# Patient Record
Sex: Male | Born: 1955 | Race: White | Hispanic: No | Marital: Married | Smoking: Never smoker
Health system: Southern US, Community
[De-identification: ages and names within clinical notes are randomized; demographics above are authoritative.]

## PROBLEM LIST (undated history)

## (undated) DIAGNOSIS — G4733 Obstructive sleep apnea (adult) (pediatric): Secondary | ICD-10-CM

## (undated) DIAGNOSIS — I255 Ischemic cardiomyopathy: Secondary | ICD-10-CM

## (undated) DIAGNOSIS — M75100 Unspecified rotator cuff tear or rupture of unspecified shoulder, not specified as traumatic: Secondary | ICD-10-CM

## (undated) DIAGNOSIS — E119 Type 2 diabetes mellitus without complications: Secondary | ICD-10-CM

## (undated) DIAGNOSIS — I509 Heart failure, unspecified: Secondary | ICD-10-CM

## (undated) DIAGNOSIS — F329 Major depressive disorder, single episode, unspecified: Secondary | ICD-10-CM

## (undated) DIAGNOSIS — F32A Depression, unspecified: Secondary | ICD-10-CM

## (undated) DIAGNOSIS — I959 Hypotension, unspecified: Secondary | ICD-10-CM

## (undated) DIAGNOSIS — I2510 Atherosclerotic heart disease of native coronary artery without angina pectoris: Secondary | ICD-10-CM

## (undated) DIAGNOSIS — I219 Acute myocardial infarction, unspecified: Secondary | ICD-10-CM

## (undated) DIAGNOSIS — N183 Chronic kidney disease, stage 3 unspecified: Secondary | ICD-10-CM

## (undated) DIAGNOSIS — G4730 Sleep apnea, unspecified: Secondary | ICD-10-CM

## (undated) DIAGNOSIS — I10 Essential (primary) hypertension: Secondary | ICD-10-CM

## (undated) DIAGNOSIS — E785 Hyperlipidemia, unspecified: Secondary | ICD-10-CM

## (undated) HISTORY — PX: ROTATOR CUFF REPAIR: SHX139

## (undated) HISTORY — DX: Heart failure, unspecified: I50.9

## (undated) HISTORY — DX: Chronic kidney disease, stage 3 unspecified: N18.30

## (undated) HISTORY — DX: Ischemic cardiomyopathy: I25.5

## (undated) HISTORY — DX: Essential (primary) hypertension: I10

## (undated) HISTORY — DX: Depression, unspecified: F32.A

## (undated) HISTORY — DX: Hyperlipidemia, unspecified: E78.5

## (undated) HISTORY — DX: Morbid (severe) obesity due to excess calories: E66.01

## (undated) HISTORY — DX: Atherosclerotic heart disease of native coronary artery without angina pectoris: I25.10

## (undated) HISTORY — PX: ELBOW SURGERY: SHX618

## (undated) HISTORY — DX: Chronic kidney disease, stage 3 (moderate): N18.3

## (undated) HISTORY — DX: Obstructive sleep apnea (adult) (pediatric): G47.33

## (undated) HISTORY — DX: Unspecified rotator cuff tear or rupture of unspecified shoulder, not specified as traumatic: M75.100

## (undated) HISTORY — DX: Sleep apnea, unspecified: G47.30

## (undated) HISTORY — DX: Type 2 diabetes mellitus without complications: E11.9

## (undated) HISTORY — DX: Major depressive disorder, single episode, unspecified: F32.9

## (undated) HISTORY — PX: CARPAL TUNNEL RELEASE: SHX101

---

## 1993-02-09 HISTORY — PX: ATRIAL FIBRILLATION ABLATION: SHX5732

## 1993-02-09 HISTORY — PX: UVULECTOMY: SHX2631

## 2004-08-04 ENCOUNTER — Emergency Department: Payer: Self-pay | Admitting: Emergency Medicine

## 2005-03-24 ENCOUNTER — Other Ambulatory Visit: Payer: Self-pay

## 2005-03-24 ENCOUNTER — Emergency Department: Payer: Self-pay | Admitting: Unknown Physician Specialty

## 2006-03-22 ENCOUNTER — Emergency Department: Payer: Self-pay | Admitting: Emergency Medicine

## 2006-03-22 ENCOUNTER — Other Ambulatory Visit: Payer: Self-pay

## 2006-07-07 ENCOUNTER — Ambulatory Visit: Payer: Self-pay | Admitting: Urology

## 2007-07-30 ENCOUNTER — Emergency Department: Payer: Self-pay | Admitting: Internal Medicine

## 2009-08-22 ENCOUNTER — Emergency Department: Payer: Self-pay | Admitting: Emergency Medicine

## 2009-09-17 ENCOUNTER — Emergency Department: Payer: Self-pay | Admitting: Emergency Medicine

## 2010-05-21 ENCOUNTER — Emergency Department: Payer: Self-pay | Admitting: Emergency Medicine

## 2010-05-23 ENCOUNTER — Other Ambulatory Visit: Payer: Self-pay | Admitting: Family Medicine

## 2010-07-05 ENCOUNTER — Emergency Department: Payer: Self-pay | Admitting: Emergency Medicine

## 2010-11-22 ENCOUNTER — Emergency Department: Payer: Self-pay | Admitting: Emergency Medicine

## 2010-12-08 ENCOUNTER — Ambulatory Visit: Payer: Self-pay | Admitting: Orthopedic Surgery

## 2010-12-25 ENCOUNTER — Other Ambulatory Visit: Payer: Self-pay | Admitting: Family Medicine

## 2011-03-25 ENCOUNTER — Other Ambulatory Visit: Payer: Self-pay | Admitting: Family Medicine

## 2011-03-25 LAB — COMPREHENSIVE METABOLIC PANEL
Alkaline Phosphatase: 55 U/L (ref 50–136)
Bilirubin,Total: 0.3 mg/dL (ref 0.2–1.0)
Calcium, Total: 9.6 mg/dL (ref 8.5–10.1)
Chloride: 104 mmol/L (ref 98–107)
Co2: 30 mmol/L (ref 21–32)
Creatinine: 0.92 mg/dL (ref 0.60–1.30)
EGFR (African American): >60
EGFR (Non-African Amer.): >60
Glucose: 184 mg/dL — ABNORMAL HIGH (ref 65–99)
Osmolality: 290 (ref 275–301)
SGOT(AST): 24 U/L (ref 15–37)
SGPT (ALT): 33 U/L
Sodium: 141 mmol/L (ref 136–145)

## 2011-03-25 LAB — LIPID PANEL
Cholesterol: 173 mg/dL (ref 0–200)
Ldl Cholesterol, Calc: 103 mg/dL — ABNORMAL HIGH (ref 0–100)
Triglycerides: 174 mg/dL (ref 0–200)
VLDL Cholesterol, Calc: 35 mg/dL (ref 5–40)

## 2012-01-02 ENCOUNTER — Emergency Department: Payer: Self-pay | Admitting: Unknown Physician Specialty

## 2012-03-24 ENCOUNTER — Emergency Department: Payer: Self-pay | Admitting: Emergency Medicine

## 2012-03-25 LAB — RAPID INFLUENZA A&B ANTIGENS

## 2012-06-26 ENCOUNTER — Ambulatory Visit: Payer: Self-pay | Admitting: Family Medicine

## 2012-07-22 ENCOUNTER — Other Ambulatory Visit: Payer: Self-pay | Admitting: Family Medicine

## 2012-07-22 LAB — COMPREHENSIVE METABOLIC PANEL
Albumin: 3.5 g/dL (ref 3.4–5.0)
Alkaline Phosphatase: 87 U/L (ref 50–136)
Anion Gap: 5 — ABNORMAL LOW (ref 7–16)
BUN: 42 mg/dL — ABNORMAL HIGH (ref 7–18)
Bilirubin,Total: 0.3 mg/dL (ref 0.2–1.0)
Calcium, Total: 9.1 mg/dL (ref 8.5–10.1)
Chloride: 105 mmol/L (ref 98–107)
Co2: 26 mmol/L (ref 21–32)
Creatinine: 2.51 mg/dL — ABNORMAL HIGH (ref 0.60–1.30)
EGFR (African American): 32 — ABNORMAL LOW
EGFR (Non-African Amer.): 28 — ABNORMAL LOW
Glucose: 357 mg/dL — ABNORMAL HIGH (ref 65–99)
SGOT(AST): 33 U/L (ref 15–37)
SGPT (ALT): 41 U/L (ref 12–78)

## 2012-07-22 LAB — LIPID PANEL
Cholesterol: 169 mg/dL (ref 0–200)
HDL Cholesterol: 27 mg/dL — ABNORMAL LOW (ref 40–60)
Ldl Cholesterol, Calc: 69 mg/dL (ref 0–100)
Triglycerides: 365 mg/dL — ABNORMAL HIGH (ref 0–200)
VLDL Cholesterol, Calc: 73 mg/dL — ABNORMAL HIGH (ref 5–40)

## 2013-08-04 ENCOUNTER — Emergency Department: Payer: Self-pay | Admitting: Emergency Medicine

## 2013-08-21 ENCOUNTER — Other Ambulatory Visit: Payer: Self-pay | Admitting: Family Medicine

## 2013-08-21 LAB — COMPREHENSIVE METABOLIC PANEL
ALK PHOS: 86 U/L
ALT: 22 U/L (ref 12–78)
Albumin: 3.3 g/dL — ABNORMAL LOW (ref 3.4–5.0)
Anion Gap: 8 (ref 7–16)
BUN: 27 mg/dL — ABNORMAL HIGH (ref 7–18)
Bilirubin,Total: 0.5 mg/dL (ref 0.2–1.0)
Calcium, Total: 9 mg/dL (ref 8.5–10.1)
Chloride: 95 mmol/L — ABNORMAL LOW (ref 98–107)
Co2: 25 mmol/L (ref 21–32)
Creatinine: 1.9 mg/dL — ABNORMAL HIGH (ref 0.60–1.30)
GFR CALC AF AMER: 45 — AB
GFR CALC NON AF AMER: 39 — AB
Glucose: 389 mg/dL — ABNORMAL HIGH (ref 65–99)
OSMOLALITY: 278 (ref 275–301)
Potassium: 4.7 mmol/L (ref 3.5–5.1)
SGOT(AST): 19 U/L (ref 15–37)
Sodium: 128 mmol/L — ABNORMAL LOW (ref 136–145)
Total Protein: 7.7 g/dL (ref 6.4–8.2)

## 2013-08-21 LAB — LIPID PANEL
CHOLESTEROL: 308 mg/dL — AB (ref 0–200)
HDL Cholesterol: 31 mg/dL — ABNORMAL LOW (ref 40–60)
Triglycerides: 550 mg/dL — ABNORMAL HIGH (ref 0–200)

## 2013-08-21 LAB — CBC WITH DIFFERENTIAL/PLATELET
BASOS PCT: 0.9 %
Basophil #: 0.1 10*3/uL (ref 0.0–0.1)
EOS ABS: 0.1 10*3/uL (ref 0.0–0.7)
Eosinophil %: 0.8 %
HCT: 41 % (ref 40.0–52.0)
HGB: 13.5 g/dL (ref 13.0–18.0)
Lymphocyte #: 1.4 10*3/uL (ref 1.0–3.6)
Lymphocyte %: 20.7 %
MCH: 29.6 pg (ref 26.0–34.0)
MCHC: 32.8 g/dL (ref 32.0–36.0)
MCV: 90 fL (ref 80–100)
Monocyte #: 0.3 x10 3/mm (ref 0.2–1.0)
Monocyte %: 4 %
NEUTROS PCT: 73.6 %
Neutrophil #: 5.1 10*3/uL (ref 1.4–6.5)
Platelet: 204 10*3/uL (ref 150–440)
RBC: 4.55 10*6/uL (ref 4.40–5.90)
RDW: 13.2 % (ref 11.5–14.5)
WBC: 6.9 10*3/uL (ref 3.8–10.6)

## 2013-08-21 LAB — HEMOGLOBIN A1C: Hemoglobin A1C: 13 % — ABNORMAL HIGH (ref 4.2–6.3)

## 2013-09-13 ENCOUNTER — Emergency Department: Payer: Self-pay | Admitting: Emergency Medicine

## 2013-10-17 LAB — TROPONIN I
TROPONIN-I: 0.04 ng/mL
Troponin-I: 0.03 ng/mL
Troponin-I: 0.04 ng/mL

## 2013-10-17 LAB — CBC
HCT: 37.8 % — ABNORMAL LOW (ref 40.0–52.0)
HGB: 12.4 g/dL — ABNORMAL LOW (ref 13.0–18.0)
MCH: 29.6 pg (ref 26.0–34.0)
MCHC: 32.7 g/dL (ref 32.0–36.0)
MCV: 91 fL (ref 80–100)
Platelet: 217 10*3/uL (ref 150–440)
RBC: 4.17 10*6/uL — ABNORMAL LOW (ref 4.40–5.90)
RDW: 14 % (ref 11.5–14.5)
WBC: 8.3 10*3/uL (ref 3.8–10.6)

## 2013-10-17 LAB — BASIC METABOLIC PANEL
ANION GAP: 7 (ref 7–16)
BUN: 32 mg/dL — ABNORMAL HIGH (ref 7–18)
CO2: 24 mmol/L (ref 21–32)
Calcium, Total: 8.4 mg/dL — ABNORMAL LOW (ref 8.5–10.1)
Chloride: 109 mmol/L — ABNORMAL HIGH (ref 98–107)
Creatinine: 1.59 mg/dL — ABNORMAL HIGH (ref 0.60–1.30)
EGFR (African American): 56 — ABNORMAL LOW
EGFR (Non-African Amer.): 48 — ABNORMAL LOW
GLUCOSE: 262 mg/dL — AB (ref 65–99)
Osmolality: 295 (ref 275–301)
Potassium: 4.5 mmol/L (ref 3.5–5.1)
SODIUM: 140 mmol/L (ref 136–145)

## 2013-10-17 LAB — TSH: Thyroid Stimulating Horm: 4.23 u[IU]/mL

## 2013-10-18 ENCOUNTER — Inpatient Hospital Stay: Payer: Self-pay | Admitting: Internal Medicine

## 2013-10-18 DIAGNOSIS — I200 Unstable angina: Secondary | ICD-10-CM

## 2013-10-18 DIAGNOSIS — R0602 Shortness of breath: Secondary | ICD-10-CM

## 2013-10-18 DIAGNOSIS — R079 Chest pain, unspecified: Secondary | ICD-10-CM

## 2013-10-18 DIAGNOSIS — I428 Other cardiomyopathies: Secondary | ICD-10-CM

## 2013-10-19 ENCOUNTER — Encounter: Payer: Self-pay | Admitting: Cardiovascular Disease

## 2013-10-19 DIAGNOSIS — E785 Hyperlipidemia, unspecified: Secondary | ICD-10-CM

## 2013-10-19 DIAGNOSIS — E119 Type 2 diabetes mellitus without complications: Secondary | ICD-10-CM

## 2013-10-19 DIAGNOSIS — I2510 Atherosclerotic heart disease of native coronary artery without angina pectoris: Secondary | ICD-10-CM

## 2013-10-19 DIAGNOSIS — N189 Chronic kidney disease, unspecified: Secondary | ICD-10-CM

## 2013-10-19 HISTORY — PX: CARDIAC CATHETERIZATION: SHX172

## 2013-10-19 LAB — BASIC METABOLIC PANEL
ANION GAP: 8 (ref 7–16)
BUN: 29 mg/dL — ABNORMAL HIGH (ref 7–18)
Calcium, Total: 8.7 mg/dL (ref 8.5–10.1)
Chloride: 108 mmol/L — ABNORMAL HIGH (ref 98–107)
Co2: 25 mmol/L (ref 21–32)
Creatinine: 1.12 mg/dL (ref 0.60–1.30)
EGFR (Non-African Amer.): >60
Glucose: 156 mg/dL — ABNORMAL HIGH (ref 65–99)
Osmolality: 290 (ref 275–301)
Potassium: 4 mmol/L (ref 3.5–5.1)
SODIUM: 141 mmol/L (ref 136–145)

## 2013-10-19 LAB — CBC WITH DIFFERENTIAL/PLATELET
BASOS ABS: 0.1 10*3/uL (ref 0.0–0.1)
Basophil %: 1.2 %
Eosinophil #: 0.3 10*3/uL (ref 0.0–0.7)
Eosinophil %: 5.4 %
HCT: 38 % — ABNORMAL LOW (ref 40.0–52.0)
HGB: 12.2 g/dL — AB (ref 13.0–18.0)
Lymphocyte #: 1.5 10*3/uL (ref 1.0–3.6)
Lymphocyte %: 28.9 %
MCH: 28.9 pg (ref 26.0–34.0)
MCHC: 32 g/dL (ref 32.0–36.0)
MCV: 90 fL (ref 80–100)
Monocyte #: 0.3 x10 3/mm (ref 0.2–1.0)
Monocyte %: 5.2 %
NEUTROS PCT: 59.3 %
Neutrophil #: 3.2 10*3/uL (ref 1.4–6.5)
Platelet: 197 10*3/uL (ref 150–440)
RBC: 4.21 10*6/uL — ABNORMAL LOW (ref 4.40–5.90)
RDW: 13.6 % (ref 11.5–14.5)
WBC: 5.3 10*3/uL (ref 3.8–10.6)

## 2013-10-19 LAB — LIPID PANEL
CHOLESTEROL: 198 mg/dL (ref 0–200)
HDL Cholesterol: 21 mg/dL — ABNORMAL LOW (ref 40–60)
Triglycerides: 534 mg/dL — ABNORMAL HIGH (ref 0–200)

## 2013-10-20 ENCOUNTER — Encounter: Payer: Self-pay | Admitting: *Deleted

## 2013-10-20 DIAGNOSIS — I214 Non-ST elevation (NSTEMI) myocardial infarction: Secondary | ICD-10-CM

## 2013-10-22 LAB — CULTURE, BLOOD (SINGLE)

## 2013-10-23 ENCOUNTER — Encounter (INDEPENDENT_AMBULATORY_CARE_PROVIDER_SITE_OTHER): Payer: Self-pay

## 2013-10-23 ENCOUNTER — Encounter: Payer: Self-pay | Admitting: Cardiovascular Disease

## 2013-10-23 ENCOUNTER — Encounter: Payer: Self-pay | Admitting: Nurse Practitioner

## 2013-10-23 ENCOUNTER — Ambulatory Visit (INDEPENDENT_AMBULATORY_CARE_PROVIDER_SITE_OTHER): Payer: BC Managed Care – PPO | Admitting: Nurse Practitioner

## 2013-10-23 VITALS — BP 118/82 | HR 76 | Ht 65.0 in | Wt 221.0 lb

## 2013-10-23 DIAGNOSIS — E785 Hyperlipidemia, unspecified: Secondary | ICD-10-CM

## 2013-10-23 DIAGNOSIS — I209 Angina pectoris, unspecified: Secondary | ICD-10-CM

## 2013-10-23 DIAGNOSIS — I25119 Atherosclerotic heart disease of native coronary artery with unspecified angina pectoris: Secondary | ICD-10-CM

## 2013-10-23 DIAGNOSIS — I208 Other forms of angina pectoris: Secondary | ICD-10-CM

## 2013-10-23 DIAGNOSIS — N183 Chronic kidney disease, stage 3 unspecified: Secondary | ICD-10-CM | POA: Insufficient documentation

## 2013-10-23 DIAGNOSIS — E1122 Type 2 diabetes mellitus with diabetic chronic kidney disease: Secondary | ICD-10-CM | POA: Insufficient documentation

## 2013-10-23 DIAGNOSIS — F339 Major depressive disorder, recurrent, unspecified: Secondary | ICD-10-CM | POA: Insufficient documentation

## 2013-10-23 DIAGNOSIS — R079 Chest pain, unspecified: Secondary | ICD-10-CM

## 2013-10-23 DIAGNOSIS — I2510 Atherosclerotic heart disease of native coronary artery without angina pectoris: Secondary | ICD-10-CM

## 2013-10-23 DIAGNOSIS — I2589 Other forms of chronic ischemic heart disease: Secondary | ICD-10-CM

## 2013-10-23 DIAGNOSIS — I255 Ischemic cardiomyopathy: Secondary | ICD-10-CM

## 2013-10-23 DIAGNOSIS — I10 Essential (primary) hypertension: Secondary | ICD-10-CM

## 2013-10-23 MED ORDER — ISOSORBIDE MONONITRATE ER 30 MG PO TB24: 15.0000 mg | ORAL_TABLET | Freq: Every day | ORAL | Status: DC

## 2013-10-23 NOTE — Patient Instructions (Signed)
Your physician has recommended you make the following change in your medication:  Start Imdur 15 mg (1/2 tablet) once daily   Your physician recommends that you schedule a follow-up appointment in: Dr. Kirke Corin in 3 weeks

## 2013-10-23 NOTE — Progress Notes (Signed)
Patient Name: Angel Cruz Date of Encounter: 10/23/2013  Primary Care Provider:  Tommy Rainwater, MD Primary Cardiologist:  Judie Petit. Kirke Corin, MD   Patient Profile  58 year old male with recent admission for unstable angina who presents for followup.  Problem List   Past Medical History  Diagnosis Date  . Diabetes mellitus without complication   . Hypertension   . Hyperlipidemia   . Depression   . Rotator cuff tear   . CKD (chronic kidney disease), stage III   . Ischemic cardiomyopathy     a. 10/2013 Echo: EF 35-40%, severe distal anteroseptal, anterior, and apical HK. Diast dysfxn, mild conc LVH, mildly dil LA, mild Ao sclerosis w/o stenosis.  Marland Kitchen CAD (coronary artery disease)     a. 10/2013 Lexi MV: EF 31%, apical, septal, ant-apical, inf-apical, lat-apical scar, septal and apical peri-infarct ischemia;  b. 10/2013 Cath: LM nl, LAD 20ost, 176m (faint R->L collats), D1 80p, LCX min irregs, OM1 min irregs, OM2 nl, OM3 30p, RCA 20p, PDA 60, RPL min irregs-->Med Rx.  . Morbid obesity    Past Surgical History  Procedure Laterality Date  . Cardiac catheterization  10/19/2013    [facility]    Allergies  No Known Allergies  HPI  58 year old male who was recently admitted to [facility] with complaints of cough and chest pain.  He ruled out for myocardial infarction.  He underwent stress testing which revealed apical infarct with peri-infarct ischemia.  EF was 31%.  Echocardiogram showed EF 35-40% with anteroseptal, anterior, apical wall motion abnormalities.  Given abnormal cardiac findings, we were consulted and he subsequently underwent diagnostic catheterization which revealed an occluded mid LAD with otherwise nonobstructive CAD.  There were faint right to left collaterals supplying the distal LAD distribution.  Medical therapy was optimized with aspirin, beta blocker, ACE inhibitor, and statin therapy, and he was ultimately discharged home.  Creatinine remained stable post  catheterization.  Since his discharge, he has had intermittent, mild exertional chest discomfort.  He has not had taken a sublingual nitroglycerin.  It is also noted mild discomfort when lying down.  He denies PND, orthopnea, dizziness, syncope, edema, or early satiety.  He has not been weighing himself at home.  He already adheres to a low sodium diet and even prior to his admission was actively trying to lose weight.  His wife is status post bariatric surgery.  Home Medications  Prior to Admission medications   Medication Sig Start Date End Date Taking? Authorizing Provider  aspirin 81 MG tablet Take 81 mg by mouth daily.   Yes Historical Provider, MD  carvedilol (COREG) 6.25 MG tablet Take 6.25 mg by mouth 2 (two) times daily with a meal.   Yes Historical Provider, MD  Insulin Glargine (LANTUS SOLOSTAR) 100 UNIT/ML Solostar Pen Inject 20 Units into the skin daily.   Yes Historical Provider, MD  insulin lispro (HUMALOG) 100 UNIT/ML injection Inject 5 Units into the skin 3 (three) times daily with meals.    Yes Historical Provider, MD  lisinopril (PRINIVIL,ZESTRIL) 20 MG tablet Take 20 mg by mouth daily.   Yes Historical Provider, MD  nitroGLYCERIN (NITROSTAT) 0.4 MG SL tablet Place 0.4 mg under the tongue every 5 (five) minutes as needed for chest pain.   Yes Historical Provider, MD  QUEtiapine (SEROQUEL) 100 MG tablet Take 100 mg by mouth at bedtime.   Yes Historical Provider, MD  rosuvastatin (CRESTOR) 20 MG tablet Take 20 mg by mouth daily.   Yes Historical Provider,  MD  isosorbide mononitrate (IMDUR) 30 MG 24 hr tablet Take 0.5 tablets (15 mg total) by mouth daily. 10/23/13   Ok Anis, NP    Review of Systems  As above, he has been experiencing mild exertional chest discomfort.  He has had some symptoms while lying flat on his back as well.  All other systems reviewed and are otherwise negative except as noted above.  Physical Exam  Blood pressure 118/82, pulse 76, height 5'  5" (1.651 m), weight 221 lb (100.245 kg).  General: Pleasant, NAD Psych: Normal affect. Neuro: Alert and oriented X 3. Moves all extremities spontaneously. HEENT: Normal  Neck: Supple without bruits or JVD. Lungs:  Resp regular and unlabored, CTA. Heart: RRR no s3, s4, or murmurs. Abdomen: Soft, non-tender, non-distended, BS + x 4.  Extremities: No clubbing, cyanosis or edema. DP/PT/Radials 2+ and equal bilaterally.right radial catheter site is without bleeding, bruit, or hematoma.  Accessory Clinical Findings  ECG - review of sinus rhythm, 76, diffuse T wave inversion-similar to hospital ECGs.  Assessment & Plan  1.  Stable angina/coronary artery disease: Patient is status post recent admission during which he ruled out for MI.  He underwent diagnostic catheterization revealing an occluded LAD with right-to-left collaterals, and otherwise nonobstructive coronary artery disease.  He has been medically managed with aspirin, beta blocker, ACE inhibitor, and statin therapy.  As he has been having exertional and occasionally rest angina, I have added isosorbide mononitrate 15 mg to his regimen.  He will stay out of work for two more weeks and I will arrange for him to f/u with Dr. Kirke Corin in early October.  2.  ICM:  EF 35-40% by echo @ [facility].  Euvolemic on exam.  We discussed the implications of this diagnosis and the importance of daily wts, Ss reporting, and med/dietary compliance.  Cont bb/acei.  With known CKD, would hold off on adding spiro/ARNI.  3.  HTN:  Stable on bb/acei.  4.  HL:  Cont statin therapy.  5.  IDDM:  He is now paying closer attn to his sugars @ home.  Followed by PCP.  Cont statin/insulin/ACEI.  6.  CKD III:  Creat was stable prior to d/c. Cont ACEI.  7.  Dispo:  F/U with Dr. Kirke Corin on 10/6 or sooner if necessary.  If he's feeling well @ that time, he'd like to return to work.  Will keep him out of work until his visit.  Nicolasa Ducking, NP 10/23/2013, 3:30 PM

## 2013-10-26 ENCOUNTER — Telehealth: Payer: Self-pay

## 2013-10-26 NOTE — Telephone Encounter (Signed)
Pt called, states his BP is 167/101, HR 80 at 8am.  At 10:30 a.m. 145/84, HR 102 He denies chest pain and shortness of breath  He stated he does not feel bad he is just concerned about his heart rate and blood pressure  Upon return call his BP was 122/67 and heart rate was 100

## 2013-10-26 NOTE — Telephone Encounter (Signed)
Pt called, states his BP is 167/101, HR 80 at 8am. At 10:30 a.m. 145/84, HR 102. States these readings were taken sitting down. Please call. If unable, call his son, Viviann Spare  [phone]

## 2013-10-26 NOTE — Telephone Encounter (Signed)
Continue to monitor BP and heart rate. If remains elevated, we might increase Carvedilol but his BP was relatively low during last visit.

## 2013-10-27 NOTE — Telephone Encounter (Signed)
LVM 9/18

## 2013-10-27 NOTE — Telephone Encounter (Signed)
Informed patient of Dr. Sheilah Pigeon response  Patient verbalized understanding  Will follow with patient next week

## 2013-10-28 ENCOUNTER — Emergency Department: Payer: Self-pay | Admitting: Emergency Medicine

## 2013-10-29 LAB — CBC
HCT: 40.4 % (ref 40.0–52.0)
HGB: 13.4 g/dL (ref 13.0–18.0)
MCH: 29.9 pg (ref 26.0–34.0)
MCHC: 33.3 g/dL (ref 32.0–36.0)
MCV: 90 fL (ref 80–100)
Platelet: 194 10*3/uL (ref 150–440)
RBC: 4.5 10*6/uL (ref 4.40–5.90)
RDW: 14.1 % (ref 11.5–14.5)
WBC: 5.7 10*3/uL (ref 3.8–10.6)

## 2013-10-29 LAB — BASIC METABOLIC PANEL
Anion Gap: 8 (ref 7–16)
BUN: 31 mg/dL — ABNORMAL HIGH (ref 7–18)
CHLORIDE: 106 mmol/L (ref 98–107)
CO2: 26 mmol/L (ref 21–32)
CREATININE: 1.46 mg/dL — AB (ref 0.60–1.30)
Calcium, Total: 9.9 mg/dL (ref 8.5–10.1)
GFR CALC NON AF AMER: 53 — AB
Glucose: 240 mg/dL — ABNORMAL HIGH (ref 65–99)
OSMOLALITY: 294 (ref 275–301)
Potassium: 5 mmol/L (ref 3.5–5.1)
Sodium: 140 mmol/L (ref 136–145)

## 2013-10-29 LAB — TROPONIN I: Troponin-I: 0.02 ng/mL

## 2013-10-30 ENCOUNTER — Encounter: Payer: Self-pay | Admitting: Cardiovascular Disease

## 2013-10-30 ENCOUNTER — Telehealth: Payer: Self-pay

## 2013-10-30 NOTE — Telephone Encounter (Signed)
Follow up appt scheduled for 9/22

## 2013-10-30 NOTE — Telephone Encounter (Signed)
LVM 9/21 

## 2013-10-30 NOTE — Telephone Encounter (Signed)
Follow up scheduled

## 2013-10-30 NOTE — Telephone Encounter (Signed)
Pt called with BP readings, states he went to ED via EMS on Saturday 9/19-170/101, HR 87, 167/105  HR 113 upon arrival at ED. 9/20- Discharge from ED was 169/89 HR 117. Please call.

## 2013-10-31 ENCOUNTER — Ambulatory Visit: Payer: BC Managed Care – PPO | Admitting: Cardiovascular Disease

## 2013-10-31 ENCOUNTER — Encounter: Payer: Self-pay | Admitting: Cardiovascular Disease

## 2013-10-31 ENCOUNTER — Encounter: Payer: Self-pay | Admitting: *Deleted

## 2013-10-31 ENCOUNTER — Ambulatory Visit (INDEPENDENT_AMBULATORY_CARE_PROVIDER_SITE_OTHER): Payer: BC Managed Care – PPO | Admitting: Cardiovascular Disease

## 2013-10-31 VITALS — BP 131/74 | HR 90 | Ht 65.0 in | Wt 228.2 lb

## 2013-10-31 DIAGNOSIS — I2589 Other forms of chronic ischemic heart disease: Secondary | ICD-10-CM

## 2013-10-31 DIAGNOSIS — R079 Chest pain, unspecified: Secondary | ICD-10-CM

## 2013-10-31 DIAGNOSIS — I10 Essential (primary) hypertension: Secondary | ICD-10-CM

## 2013-10-31 DIAGNOSIS — I255 Ischemic cardiomyopathy: Secondary | ICD-10-CM

## 2013-10-31 DIAGNOSIS — E785 Hyperlipidemia, unspecified: Secondary | ICD-10-CM

## 2013-10-31 DIAGNOSIS — I2510 Atherosclerotic heart disease of native coronary artery without angina pectoris: Secondary | ICD-10-CM

## 2013-10-31 MED ORDER — CARVEDILOL 25 MG PO TABS: 25.0000 mg | ORAL_TABLET | Freq: Two times a day (BID) | ORAL | Status: DC

## 2013-10-31 NOTE — Assessment & Plan Note (Signed)
He has no symptoms suggestive of angina. Continue medical therapy. 

## 2013-10-31 NOTE — Progress Notes (Signed)
HPI  58 year old male who is here today for a followup visit regarding coronary artery disease and chronic systolic heart failure. was recently admitted to [facility] with complaints of cough and chest pain.  He ruled out for myocardial infarction.  He underwent stress testing which revealed apical infarct with peri-infarct ischemia.  EF was 31%.  Echocardiogram showed EF 35-40% with anteroseptal, anterior, apical wall motion abnormalities.  Given abnormal cardiac findings, we were consulted and he subsequently underwent diagnostic catheterization which revealed an occluded mid LAD with otherwise nonobstructive CAD.  There were faint right to left collaterals supplying the distal LAD distribution.  Medical therapy was optimized with aspirin, beta blocker, ACE inhibitor, and statin therapy, and he was ultimately discharged home.  Creatinine remained stable post catheterization.  He has been doing reasonably well. However, he is complaining of palpitations, tachycardia and elevated blood pressure. He went to the emergency room for that reason. He has been taking carvedilol regularly.   No Known Allergies   Current Outpatient Prescriptions on File Prior to Visit  Medication Sig Dispense Refill  . aspirin 81 MG tablet Take 81 mg by mouth daily.      . Insulin Glargine (LANTUS SOLOSTAR) 100 UNIT/ML Solostar Pen Inject 20 Units into the skin daily.      . insulin lispro (HUMALOG) 100 UNIT/ML injection Inject 5 Units into the skin 3 (three) times daily with meals.       . nitroGLYCERIN (NITROSTAT) 0.4 MG SL tablet Place 0.4 mg under the tongue every 5 (five) minutes as needed for chest pain.      Marland Kitchen QUEtiapine (SEROQUEL) 100 MG tablet Take 100 mg by mouth at bedtime.      . rosuvastatin (CRESTOR) 20 MG tablet Take 20 mg by mouth daily.       No current facility-administered medications on file prior to visit.     Past Medical History  Diagnosis Date  . Diabetes mellitus  without complication   . Hypertension   . Hyperlipidemia   . Depression   . Rotator cuff tear   . CKD (chronic kidney disease), stage III   . Ischemic cardiomyopathy     a. 10/2013 Echo: EF 35-40%, severe distal anteroseptal, anterior, and apical HK. Diast dysfxn, mild conc LVH, mildly dil LA, mild Ao sclerosis w/o stenosis.  Marland Kitchen CAD (coronary artery disease)     a. 10/2013 Lexi MV: EF 31%, apical, septal, ant-apical, inf-apical, lat-apical scar, septal and apical peri-infarct ischemia;  b. 10/2013 Cath: LM nl, LAD 20ost, 139m (faint R->L collats), D1 80p, LCX min irregs, OM1 min irregs, OM2 nl, OM3 30p, RCA 20p, PDA 60, RPL min irregs-->Med Rx.  . Morbid obesity      Past Surgical History  Procedure Laterality Date  . Cardiac catheterization  10/19/2013    [facility]     Family History  Problem Relation Age of Onset  . Heart attack Mother   . Colon cancer Father   . Diabetes Brother   . Cancer Brother      History   Social History  . Marital Status: Married    Spouse Name: N/A    Number of Children: N/A  . Years of Education: N/A   Occupational History  . Not on file.   Social History Main Topics  . Smoking status: Never Smoker   . Smokeless tobacco: Not on file  . Alcohol Use: No  . Drug Use: No  . Sexual Activity: Not on file  Other Topics Concern  . Not on file   Social History Narrative  . No narrative on file     PHYSICAL EXAM   BP 131/74  Pulse 90  Ht 5\' 5"  (1.651 m)  Wt 228 lb 4 oz (103.534 kg)  BMI 37.98 kg/m2 Constitutional: He is oriented to person, place, and time. He appears well-developed and well-nourished. No distress.  HENT: No nasal discharge.  Head: Normocephalic and atraumatic.  Eyes: Pupils are equal and round.  No discharge. Neck: Normal range of motion. Neck supple. No JVD present. No thyromegaly present.  Cardiovascular: Normal rate, regular rhythm, normal heart sounds. Exam reveals no gallop and no friction rub. No murmur heard.    Pulmonary/Chest: Effort normal and breath sounds normal. No stridor. No respiratory distress. He has no wheezes. He has no rales. He exhibits no tenderness.  Abdominal: Soft. Bowel sounds are normal. He exhibits no distension. There is no tenderness. There is no rebound and no guarding.  Musculoskeletal: Normal range of motion. He exhibits no edema and no tenderness.  Neurological: He is alert and oriented to person, place, and time. Coordination normal.  Skin: Skin is warm and dry. No rash noted. He is not diaphoretic. No erythema. No pallor.  Psychiatric: He has a normal mood and affect. His behavior is normal. Judgment and thought content normal.       ZDG:LOVFI  Rhythm  -Anterior infarct -age undetermined.   -  T-abnormality  - Anterolateral ischemia.   ABNORMAL    ASSESSMENT AND PLAN

## 2013-10-31 NOTE — Assessment & Plan Note (Signed)
Blood pressure improved after uptitrating his medications.

## 2013-10-31 NOTE — Assessment & Plan Note (Signed)
He appears to be euvolemic. However, he continues to complain of palpitations and tachycardia. Thus, I increased the dose of carvedilol to 25 mg once daily. Continue other medications.

## 2013-10-31 NOTE — Assessment & Plan Note (Signed)
Continue treatment with Crestor with a target LDL of less than 70. 

## 2013-10-31 NOTE — Patient Instructions (Addendum)
Increase Carvedilol (Coreg) to 25 mg twice daily.  Continue other medications.   Follow up in 1 month. Stay off work until you come back in 1 month for reevaluation.

## 2013-11-09 ENCOUNTER — Encounter: Payer: Self-pay | Admitting: *Deleted

## 2013-11-09 ENCOUNTER — Telehealth: Payer: Self-pay | Admitting: *Deleted

## 2013-11-09 MED ORDER — ISOSORBIDE MONONITRATE ER 30 MG PO TB24: 30.0000 mg | ORAL_TABLET | Freq: Every day | ORAL | Status: DC

## 2013-11-09 NOTE — Telephone Encounter (Signed)
Notified patient's wife regarding which medication needs to be refilled. The patient's wife states the dose of imdur was increased at the hospital at Imdur 30 mg take one tablet daily.

## 2013-11-09 NOTE — Telephone Encounter (Signed)
Refill sent for Imdur 30 mg take one tablet daily.

## 2013-11-09 NOTE — Telephone Encounter (Signed)
30mg.

## 2013-11-13 ENCOUNTER — Telehealth: Payer: Self-pay

## 2013-11-13 ENCOUNTER — Emergency Department: Payer: Self-pay | Admitting: Emergency Medicine

## 2013-11-13 LAB — CBC
HCT: 36.7 % — AB (ref 40.0–52.0)
HGB: 12.1 g/dL — ABNORMAL LOW (ref 13.0–18.0)
MCH: 29.8 pg (ref 26.0–34.0)
MCHC: 33 g/dL (ref 32.0–36.0)
MCV: 90 fL (ref 80–100)
Platelet: 192 10*3/uL (ref 150–440)
RBC: 4.06 10*6/uL — AB (ref 4.40–5.90)
RDW: 14.3 % (ref 11.5–14.5)
WBC: 8.9 10*3/uL (ref 3.8–10.6)

## 2013-11-13 LAB — COMPREHENSIVE METABOLIC PANEL
ALBUMIN: 3.7 g/dL (ref 3.4–5.0)
ALT: 27 U/L
Alkaline Phosphatase: 59 U/L
Anion Gap: 2 — ABNORMAL LOW (ref 7–16)
BILIRUBIN TOTAL: 0.4 mg/dL (ref 0.2–1.0)
BUN: 30 mg/dL — AB (ref 7–18)
CHLORIDE: 110 mmol/L — AB (ref 98–107)
CREATININE: 1.14 mg/dL (ref 0.60–1.30)
Calcium, Total: 8.7 mg/dL (ref 8.5–10.1)
Co2: 27 mmol/L (ref 21–32)
EGFR (African American): >60
EGFR (Non-African Amer.): >60
GLUCOSE: 175 mg/dL — AB (ref 65–99)
Osmolality: 288 (ref 275–301)
POTASSIUM: 5.4 mmol/L — AB (ref 3.5–5.1)
SGOT(AST): 25 U/L (ref 15–37)
Sodium: 139 mmol/L (ref 136–145)
Total Protein: 7.3 g/dL (ref 6.4–8.2)

## 2013-11-13 LAB — TROPONIN I
TROPONIN-I: 0.03 ng/mL
Troponin-I: 0.04 ng/mL

## 2013-11-13 LAB — PRO B NATRIURETIC PEPTIDE: B-TYPE NATIURETIC PEPTID: 7374 pg/mL — AB (ref 0–125)

## 2013-11-13 NOTE — Telephone Encounter (Signed)
Informed patient and wife of Dr. Sheilah Cruz response  She stated they would be more comfortable going to the ED tonight  Appt made for tomorrow in case it is still needed  Wife will cancel if issue is resolved in the ED

## 2013-11-13 NOTE — Telephone Encounter (Signed)
Pt wife called and stated:  Patient has gained weight  Went from 220-233 in two weeks  Patient has not been adding salt and has been following a low sodium diet  He is coughing and sob  He had to take one nitro tablet today for chest pain  He is still having chest pain with walking  Blood pressure this morning was 172/98 hr 91 He is taking his daily medications now

## 2013-11-13 NOTE — Telephone Encounter (Signed)
If he is still getting chest pain , then go to ED. If not, I can see him tomorrow (I have an opening at 3 pm).

## 2013-11-13 NOTE — Telephone Encounter (Signed)
Pt wife called, states pt has been coughing all night, SOB, and some chest pains. States that this is the way he felt before he went to the ED last time. 7:00 am. BP 172/98 HR 91.

## 2013-11-14 ENCOUNTER — Telehealth: Payer: Self-pay | Admitting: *Deleted

## 2013-11-14 ENCOUNTER — Ambulatory Visit: Payer: BC Managed Care – PPO | Admitting: Cardiovascular Disease

## 2013-11-14 ENCOUNTER — Ambulatory Visit (INDEPENDENT_AMBULATORY_CARE_PROVIDER_SITE_OTHER): Payer: BC Managed Care – PPO | Admitting: Cardiovascular Disease

## 2013-11-14 ENCOUNTER — Encounter: Payer: Self-pay | Admitting: Cardiovascular Disease

## 2013-11-14 VITALS — BP 104/78 | HR 74 | Ht 65.0 in | Wt 227.5 lb

## 2013-11-14 DIAGNOSIS — I25118 Atherosclerotic heart disease of native coronary artery with other forms of angina pectoris: Secondary | ICD-10-CM

## 2013-11-14 DIAGNOSIS — R0602 Shortness of breath: Secondary | ICD-10-CM

## 2013-11-14 DIAGNOSIS — E785 Hyperlipidemia, unspecified: Secondary | ICD-10-CM

## 2013-11-14 DIAGNOSIS — I5022 Chronic systolic (congestive) heart failure: Secondary | ICD-10-CM

## 2013-11-14 DIAGNOSIS — I10 Essential (primary) hypertension: Secondary | ICD-10-CM

## 2013-11-14 DIAGNOSIS — R079 Chest pain, unspecified: Secondary | ICD-10-CM

## 2013-11-14 MED ORDER — FUROSEMIDE 20 MG PO TABS: 20.0000 mg | ORAL_TABLET | Freq: Every day | ORAL | Status: DC

## 2013-11-14 NOTE — Assessment & Plan Note (Signed)
Blood pressure is controlled on current medications. 

## 2013-11-14 NOTE — Assessment & Plan Note (Signed)
He has no convincing symptoms of angina. Symptoms overall improved with medical therapy which will be continued.

## 2013-11-14 NOTE — Assessment & Plan Note (Signed)
He is currently New York Heart Association class III. He had recent presentation with fluid overload which improved with furosemide. I decreased the dose to 20 mg once daily. I will check basic metabolic profile up on his followup later this month to determine his ability to go back to work.

## 2013-11-14 NOTE — Progress Notes (Signed)
HPI  58 year old male who is here today for a followup visit regarding coronary artery disease and chronic systolic heart failure. He was  admitted to [facility] in September with complaints of cough and chest pain.  He ruled out for myocardial infarction.  He underwent stress testing which revealed apical infarct with peri-infarct ischemia.  EF was 31%.  Echocardiogram showed EF 35-40% with anteroseptal, anterior, apical wall motion abnormalities.  Given abnormal cardiac findings, we were consulted and he subsequently underwent diagnostic catheterization which revealed an occluded mid LAD with otherwise nonobstructive CAD.  There were faint right to left collaterals supplying the distal LAD distribution.  Medical therapy was optimized with aspirin, beta blocker, ACE inhibitor, and statin therapy, and he was ultimately discharged home.   During last visit, I increased the dose of carvedilol. He continued to have elevated blood pressure and started having difficulty breathing. He went to the emergency room at [facility]. His labs showed negative cardiac enzymes. Creatinine was 1.14, BNP was 7000. CBC was unremarkable. Chest x-ray showed mild CHF. He was given one dose of IV Lasix and discharged home on 40 mg by mouth once daily. He is feeling better and reports improvement in symptoms.  No Known Allergies   Current Outpatient Prescriptions on File Prior to Visit  Medication Sig Dispense Refill  . aspirin 81 MG tablet Take 81 mg by mouth daily.      . carvedilol (COREG) 25 MG tablet Take 1 tablet (25 mg total) by mouth 2 (two) times daily.  60 tablet  6  . glucagon 1 MG injection Inject 1 mg into the vein once as needed.      . Insulin Glargine (LANTUS SOLOSTAR) 100 UNIT/ML Solostar Pen Inject 20 Units into the skin daily.      . insulin lispro (HUMALOG) 100 UNIT/ML injection Inject 5 Units into the skin 3 (three) times daily with meals.       . isosorbide mononitrate (IMDUR) 30 MG 24  hr tablet Take 1 tablet (30 mg total) by mouth daily.  30 tablet  3  . lisinopril-hydrochlorothiazide (PRINZIDE,ZESTORETIC) 20-12.5 MG per tablet Take 1 tablet by mouth daily.      . nitroGLYCERIN (NITROSTAT) 0.4 MG SL tablet Place 0.4 mg under the tongue every 5 (five) minutes as needed for chest pain.      Marland Kitchen QUEtiapine (SEROQUEL) 100 MG tablet Take 100 mg by mouth at bedtime.      . rosuvastatin (CRESTOR) 20 MG tablet Take 20 mg by mouth daily.       No current facility-administered medications on file prior to visit.     Past Medical History  Diagnosis Date  . Diabetes mellitus without complication   . Hypertension   . Hyperlipidemia   . Depression   . Rotator cuff tear   . CKD (chronic kidney disease), stage III   . Ischemic cardiomyopathy     a. 10/2013 Echo: EF 35-40%, severe distal anteroseptal, anterior, and apical HK. Diast dysfxn, mild conc LVH, mildly dil LA, mild Ao sclerosis w/o stenosis.  Marland Kitchen CAD (coronary artery disease)     a. 10/2013 Lexi MV: EF 31%, apical, septal, ant-apical, inf-apical, lat-apical scar, septal and apical peri-infarct ischemia;  b. 10/2013 Cath: LM nl, LAD 20ost, 175m (faint R->L collats), D1 80p, LCX min irregs, OM1 min irregs, OM2 nl, OM3 30p, RCA 20p, PDA 60, RPL min irregs-->Med Rx.  . Morbid obesity   . CHF (congestive heart failure)  Past Surgical History  Procedure Laterality Date  . Cardiac catheterization  10/19/2013    [facility]     Family History  Problem Relation Age of Onset  . Heart attack Mother   . Colon cancer Father   . Diabetes Brother   . Cancer Brother      History   Social History  . Marital Status: Married    Spouse Name: N/A    Number of Children: N/A  . Years of Education: N/A   Occupational History  . Not on file.   Social History Main Topics  . Smoking status: Never Smoker   . Smokeless tobacco: Not on file  . Alcohol Use: No  . Drug Use: No  . Sexual Activity: Not on file   Other Topics Concern  .  Not on file   Social History Narrative  . No narrative on file     PHYSICAL EXAM   BP 104/78  Pulse 74  Ht 5\' 5"  (1.651 m)  Wt 227 lb 8 oz (103.193 kg)  BMI 37.86 kg/m2 Constitutional: He is oriented to person, place, and time. He appears well-developed and well-nourished. No distress.  HENT: No nasal discharge.  Head: Normocephalic and atraumatic.  Eyes: Pupils are equal and round.  No discharge. Neck: Normal range of motion. Neck supple. No JVD present. No thyromegaly present.  Cardiovascular: Normal rate, regular rhythm, normal heart sounds. Exam reveals no gallop and no friction rub. No murmur heard.  Pulmonary/Chest: Effort normal and breath sounds normal. No stridor. No respiratory distress. He has no wheezes. He has no rales. He exhibits no tenderness.  Abdominal: Soft. Bowel sounds are normal. He exhibits no distension. There is no tenderness. There is no rebound and no guarding.  Musculoskeletal: Normal range of motion. He exhibits no edema and no tenderness.  Neurological: He is alert and oriented to person, place, and time. Coordination normal.  Skin: Skin is warm and dry. No rash noted. He is not diaphoretic. No erythema. No pallor.  Psychiatric: He has a normal mood and affect. His behavior is normal. Judgment and thought content normal.       WUJ:[id number]:Sinus  Rhythm  -  T-abnormality  - Anterior/lateral ischemia.   ABNORMAL   ASSESSMENT AND PLAN

## 2013-11-14 NOTE — Telephone Encounter (Signed)
Instructed patient to keep scheduled appt  Patient verbalized understanding

## 2013-11-14 NOTE — Assessment & Plan Note (Signed)
Continue treatment with rosuvastatin with a target LDL of less than 70. He will require a fasting lipid and liver profile in the near future. 

## 2013-11-14 NOTE — Telephone Encounter (Signed)
Patient did go to the ER with congestive heart failure (mild). Patient's wife wants to know if she should bring her husband today. Please call.

## 2013-11-14 NOTE — Patient Instructions (Signed)
Decrease Lasix to 20 mg once daily.   Continue other medication.   Keep appointment on 10/22

## 2013-11-30 ENCOUNTER — Encounter: Payer: Self-pay | Admitting: *Deleted

## 2013-11-30 ENCOUNTER — Ambulatory Visit (INDEPENDENT_AMBULATORY_CARE_PROVIDER_SITE_OTHER): Payer: BC Managed Care – PPO | Admitting: Cardiovascular Disease

## 2013-11-30 ENCOUNTER — Encounter: Payer: Self-pay | Admitting: Cardiovascular Disease

## 2013-11-30 VITALS — BP 104/80 | HR 64 | Ht 67.0 in | Wt 223.0 lb

## 2013-11-30 DIAGNOSIS — E785 Hyperlipidemia, unspecified: Secondary | ICD-10-CM

## 2013-11-30 DIAGNOSIS — I5022 Chronic systolic (congestive) heart failure: Secondary | ICD-10-CM

## 2013-11-30 DIAGNOSIS — I2510 Atherosclerotic heart disease of native coronary artery without angina pectoris: Secondary | ICD-10-CM

## 2013-11-30 DIAGNOSIS — I10 Essential (primary) hypertension: Secondary | ICD-10-CM

## 2013-11-30 MED ORDER — LOSARTAN POTASSIUM-HCTZ 50-12.5 MG PO TABS: 1.0000 | ORAL_TABLET | Freq: Every day | ORAL | Status: DC

## 2013-11-30 NOTE — Progress Notes (Signed)
HPI  58 year old male who is here today for a followup visit regarding coronary artery disease and chronic systolic heart failure. He was  admitted to [facility] in September with complaints of cough and chest pain.  He ruled out for myocardial infarction.  He underwent stress testing which revealed apical infarct with peri-infarct ischemia.  EF was 31%.  Echocardiogram showed EF 35-40% with anteroseptal, anterior, apical wall motion abnormalities. He underwent cardiac catheterization which revealed an occluded mid LAD with otherwise nonobstructive CAD.  There were faint right to left collaterals supplying the distal LAD distribution.  Medical therapy was optimized with aspirin, beta blocker, ACE inhibitor, and statin therapy, and he was ultimately discharged home.   He was readmitted with fluid overload and was started on Lasix. He has been doing well since then. His only complaint is dry cough. Otherwise he has been able to do more activities with no significant limitations. He feels close to his baseline.  No Known Allergies   Current Outpatient Prescriptions on File Prior to Visit  Medication Sig Dispense Refill  . aspirin 81 MG tablet Take 81 mg by mouth daily.      . carvedilol (COREG) 25 MG tablet Take 1 tablet (25 mg total) by mouth 2 (two) times daily.  60 tablet  6  . furosemide (LASIX) 20 MG tablet Take 1 tablet (20 mg total) by mouth daily.  30 tablet  6  . glucagon 1 MG injection Inject 1 mg into the vein once as needed.      . Insulin Glargine (LANTUS SOLOSTAR) 100 UNIT/ML Solostar Pen Inject 20 Units into the skin daily.      . insulin lispro (HUMALOG) 100 UNIT/ML injection Inject 5 Units into the skin 3 (three) times daily with meals.       . isosorbide mononitrate (IMDUR) 30 MG 24 hr tablet Take 1 tablet (30 mg total) by mouth daily.  30 tablet  3  . nitroGLYCERIN (NITROSTAT) 0.4 MG SL tablet Place 0.4 mg under the tongue every 5 (five) minutes as needed for  chest pain.      Marland Kitchen QUEtiapine (SEROQUEL) 100 MG tablet Take 100 mg by mouth at bedtime.      . rosuvastatin (CRESTOR) 20 MG tablet Take 20 mg by mouth daily.       No current facility-administered medications on file prior to visit.     Past Medical History  Diagnosis Date  . Diabetes mellitus without complication   . Hypertension   . Hyperlipidemia   . Depression   . Rotator cuff tear   . CKD (chronic kidney disease), stage III   . Ischemic cardiomyopathy     a. 10/2013 Echo: EF 35-40%, severe distal anteroseptal, anterior, and apical HK. Diast dysfxn, mild conc LVH, mildly dil LA, mild Ao sclerosis w/o stenosis.  Marland Kitchen CAD (coronary artery disease)     a. 10/2013 Lexi MV: EF 31%, apical, septal, ant-apical, inf-apical, lat-apical scar, septal and apical peri-infarct ischemia;  b. 10/2013 Cath: LM nl, LAD 20ost, 123m (faint R->L collats), D1 80p, LCX min irregs, OM1 min irregs, OM2 nl, OM3 30p, RCA 20p, PDA 60, RPL min irregs-->Med Rx.  . Morbid obesity   . CHF (congestive heart failure)      Past Surgical History  Procedure Laterality Date  . Cardiac catheterization  10/19/2013    [facility]     Family History  Problem Relation Age of Onset  . Heart attack Mother   . Colon  cancer Father   . Diabetes Brother   . Cancer Brother      History   Social History  . Marital Status: Married    Spouse Name: N/A    Number of Children: N/A  . Years of Education: N/A   Occupational History  . Not on file.   Social History Main Topics  . Smoking status: Never Smoker   . Smokeless tobacco: Not on file  . Alcohol Use: No  . Drug Use: No  . Sexual Activity: Not on file   Other Topics Concern  . Not on file   Social History Narrative  . No narrative on file     PHYSICAL EXAM   BP 104/80  Pulse 64  Ht 5\' 7"  (1.702 m)  Wt 223 lb (101.152 kg)  BMI 34.92 kg/m2 Constitutional: He is oriented to person, place, and time. He appears well-developed and well-nourished. No distress.   HENT: No nasal discharge.  Head: Normocephalic and atraumatic.  Eyes: Pupils are equal and round.  No discharge. Neck: Normal range of motion. Neck supple. No JVD present. No thyromegaly present.  Cardiovascular: Normal rate, regular rhythm, normal heart sounds. Exam reveals no gallop and no friction rub. No murmur heard.  Pulmonary/Chest: Effort normal and breath sounds normal. No stridor. No respiratory distress. He has no wheezes. He has no rales. He exhibits no tenderness.  Abdominal: Soft. Bowel sounds are normal. He exhibits no distension. There is no tenderness. There is no rebound and no guarding.  Musculoskeletal: Normal range of motion. He exhibits no edema and no tenderness.  Neurological: He is alert and oriented to person, place, and time. Coordination normal.  Skin: Skin is warm and dry. No rash noted. He is not diaphoretic. No erythema. No pallor.  Psychiatric: He has a normal mood and affect. His behavior is normal. Judgment and thought content normal.        ASSESSMENT AND PLAN

## 2013-11-30 NOTE — Assessment & Plan Note (Signed)
Blood pressure is now well controlled on current medications. I ordered basic metabolic profile.

## 2013-11-30 NOTE — Assessment & Plan Note (Addendum)
He Is currently New York Heart Association class II and appears to be euvolemic. Dry cough is likely related to lisinopril. Thus, I switched this to losartan.  I will plan on repeating echocardiogram in 3 months to evaluate his ejection fraction. He can resume work on November 2 with no restrictions.

## 2013-11-30 NOTE — Assessment & Plan Note (Signed)
He has improved significantly with medical therapy and currently has no symptoms suggestive of angina.

## 2013-11-30 NOTE — Assessment & Plan Note (Signed)
Continue treatment with rosuvastatin with a target LDL of less than 70. He will require a fasting lipid and liver profile in the near future.

## 2013-11-30 NOTE — Patient Instructions (Signed)
Your physician has recommended you make the following change in your medication:  Stop Lisinopril-HCTZ Start Losartan-HCT 50/12.5 mg once daily   Your physician recommends that you schedule a follow-up appointment in:  3 months   Your next appointment will be scheduled in our new office located at :  [facility]  [address]  [place], [place] [address]

## 2013-12-04 ENCOUNTER — Telehealth: Payer: Self-pay | Admitting: Cardiovascular Disease

## 2013-12-04 MED ORDER — RANOLAZINE ER 500 MG PO TB12: 500.0000 mg | ORAL_TABLET | Freq: Two times a day (BID) | ORAL | Status: DC

## 2013-12-04 NOTE — Telephone Encounter (Signed)
LVM @ 1026

## 2013-12-04 NOTE — Telephone Encounter (Signed)
Patient stated he has been feeling light headed at times since his medication was changed at his last visit  His blood pressure today is 89/52 He feels dizzy and lightheaded   I informed patient that I would inform Dr. Kirke Corin  Instructed him to be taken to the ED if he feels his situation becomes emergent

## 2013-12-04 NOTE — Telephone Encounter (Signed)
Hold coreg tonight and change the dose to 12.5 mg bid starting tomorrow am.

## 2013-12-04 NOTE — Telephone Encounter (Signed)
Pt was put on new  bp meds and he is getting light headed, please advise.

## 2013-12-05 NOTE — Telephone Encounter (Signed)
LVM 10/27 

## 2013-12-06 NOTE — Telephone Encounter (Signed)
LVM @ 1028

## 2013-12-06 NOTE — Telephone Encounter (Signed)
This encounter was created in error - please disregard.

## 2013-12-06 NOTE — Telephone Encounter (Signed)
Phone number is  Wanted to give the bp reading from this morning, 8am  98/56 Hr 67 Has not taken bp meds. Wanted to talk to [place] before he took it. Has been put on new meds.  pm  105/63 Hr 67

## 2013-12-06 NOTE — Telephone Encounter (Signed)
Stop HCTZ. Stay on Losartan 50 mg daily (he will need a new Rx for this because he was on a combo), coreg 12.5 mg bid and Lasix 20 mg daily.

## 2013-12-06 NOTE — Telephone Encounter (Signed)
Pt called again, stating he is still light headed and bp is still running low. Please advise.

## 2013-12-06 NOTE — Telephone Encounter (Signed)
Pt wife is calling back, she got the call but did not hear the phone, please call back.

## 2013-12-07 ENCOUNTER — Telehealth: Payer: Self-pay | Admitting: *Deleted

## 2013-12-07 ENCOUNTER — Inpatient Hospital Stay: Payer: Self-pay | Admitting: Internal Medicine

## 2013-12-07 ENCOUNTER — Telehealth: Payer: Self-pay | Admitting: Cardiovascular Disease

## 2013-12-07 LAB — BASIC METABOLIC PANEL
ANION GAP: 9 (ref 7–16)
BUN: 74 mg/dL — ABNORMAL HIGH (ref 7–18)
CALCIUM: 8.6 mg/dL (ref 8.5–10.1)
CO2: 22 mmol/L (ref 21–32)
Chloride: 107 mmol/L (ref 98–107)
Creatinine: 2.37 mg/dL — ABNORMAL HIGH (ref 0.60–1.30)
EGFR (African American): 37 — ABNORMAL LOW
EGFR (Non-African Amer.): 30 — ABNORMAL LOW
Glucose: 213 mg/dL — ABNORMAL HIGH (ref 65–99)
Osmolality: 304 (ref 275–301)
POTASSIUM: 5.8 mmol/L — AB (ref 3.5–5.1)
SODIUM: 138 mmol/L (ref 136–145)

## 2013-12-07 LAB — URINALYSIS, COMPLETE
BILIRUBIN, UR: NEGATIVE
BLOOD: NEGATIVE
Bacteria: NONE SEEN
Glucose,UR: NEGATIVE mg/dL (ref 0–75)
KETONE: NEGATIVE
Leukocyte Esterase: NEGATIVE
Nitrite: NEGATIVE
Ph: 5 (ref 4.5–8.0)
Protein: NEGATIVE
RBC,UR: NONE SEEN /HPF (ref 0–5)
Specific Gravity: 1.012 (ref 1.003–1.030)
Squamous Epithelial: NONE SEEN
WBC UR: 1 /HPF (ref 0–5)

## 2013-12-07 LAB — CBC WITH DIFFERENTIAL/PLATELET
BASOS PCT: 1 %
Basophil #: 0.1 10*3/uL (ref 0.0–0.1)
Eosinophil #: 0.2 10*3/uL (ref 0.0–0.7)
Eosinophil %: 4.3 %
HCT: 37.9 % — ABNORMAL LOW (ref 40.0–52.0)
HGB: 12.3 g/dL — AB (ref 13.0–18.0)
Lymphocyte #: 1.6 10*3/uL (ref 1.0–3.6)
Lymphocyte %: 29.9 %
MCH: 29.6 pg (ref 26.0–34.0)
MCHC: 32.4 g/dL (ref 32.0–36.0)
MCV: 91 fL (ref 80–100)
MONOS PCT: 5.7 %
Monocyte #: 0.3 x10 3/mm (ref 0.2–1.0)
NEUTROS ABS: 3.2 10*3/uL (ref 1.4–6.5)
Neutrophil %: 59.1 %
Platelet: 162 10*3/uL (ref 150–440)
RBC: 4.16 10*6/uL — ABNORMAL LOW (ref 4.40–5.90)
RDW: 13.9 % (ref 11.5–14.5)
WBC: 5.5 10*3/uL (ref 3.8–10.6)

## 2013-12-07 LAB — TROPONIN I: Troponin-I: <0.02 ng/mL

## 2013-12-07 MED ORDER — CARVEDILOL 12.5 MG PO TABS: 12.5000 mg | ORAL_TABLET | Freq: Two times a day (BID) | ORAL | Status: DC

## 2013-12-07 MED ORDER — LOSARTAN POTASSIUM 50 MG PO TABS: 50.0000 mg | ORAL_TABLET | Freq: Every day | ORAL | Status: DC

## 2013-12-07 NOTE — Telephone Encounter (Signed)
Patient's wife called and is worried his bp 75/43 and is dizzy when he moves around and back of neck hurts.

## 2013-12-07 NOTE — Telephone Encounter (Signed)
Pt wife is calling letting us know they are being admitted, and that are concerned about pt med's, they don't know if he will be taking them in hospital or how it will go. Please call and advise

## 2013-12-07 NOTE — Telephone Encounter (Signed)
See phone note 12/06/13

## 2013-12-07 NOTE — Telephone Encounter (Signed)
Informed patients wife to follow medication instructions given in hospital  Patients wife verbalized understanding

## 2013-12-07 NOTE — Telephone Encounter (Signed)
Patients wife called back and stated that her husband took coreg this morning and held losartan-hct  His blood pressure 74/43 He is feeling dizzy and unwell   Instructed patient and wife per Dr. Kirke Corin to go to the ED  Patient and wife verbalized understanding   Instructed to make the medication changes suggested by Dr. Kirke Corin but not to take coreg, losartan, or lasix if pressure is less than 100/60 Call our office if pressure is this low and medication needs to be held

## 2013-12-08 LAB — BASIC METABOLIC PANEL
Anion Gap: 7 (ref 7–16)
BUN: 65 mg/dL — AB (ref 7–18)
CALCIUM: 8.6 mg/dL (ref 8.5–10.1)
Chloride: 114 mmol/L — ABNORMAL HIGH (ref 98–107)
Co2: 21 mmol/L (ref 21–32)
Creatinine: 1.6 mg/dL — ABNORMAL HIGH (ref 0.60–1.30)
EGFR (Non-African Amer.): 48 — ABNORMAL LOW
GFR CALC AF AMER: 58 — AB
GLUCOSE: 117 mg/dL — AB (ref 65–99)
OSMOLALITY: 303 (ref 275–301)
Potassium: 5.1 mmol/L (ref 3.5–5.1)
Sodium: 142 mmol/L (ref 136–145)

## 2013-12-09 LAB — BASIC METABOLIC PANEL
Anion Gap: 2 — ABNORMAL LOW (ref 7–16)
BUN: 45 mg/dL — AB (ref 7–18)
CHLORIDE: 115 mmol/L — AB (ref 98–107)
CO2: 25 mmol/L (ref 21–32)
Calcium, Total: 9.1 mg/dL (ref 8.5–10.1)
Creatinine: 1.12 mg/dL (ref 0.60–1.30)
Glucose: 70 mg/dL (ref 65–99)
Osmolality: 293 (ref 275–301)
Potassium: 5.2 mmol/L — ABNORMAL HIGH (ref 3.5–5.1)
SODIUM: 142 mmol/L (ref 136–145)

## 2013-12-18 ENCOUNTER — Ambulatory Visit (INDEPENDENT_AMBULATORY_CARE_PROVIDER_SITE_OTHER): Payer: BC Managed Care – PPO | Admitting: Cardiovascular Disease

## 2013-12-18 ENCOUNTER — Encounter: Payer: Self-pay | Admitting: Cardiovascular Disease

## 2013-12-18 VITALS — BP 166/86 | HR 74 | Ht 67.0 in | Wt 223.2 lb

## 2013-12-18 DIAGNOSIS — I5022 Chronic systolic (congestive) heart failure: Secondary | ICD-10-CM

## 2013-12-18 DIAGNOSIS — R079 Chest pain, unspecified: Secondary | ICD-10-CM

## 2013-12-18 DIAGNOSIS — I2510 Atherosclerotic heart disease of native coronary artery without angina pectoris: Secondary | ICD-10-CM

## 2013-12-18 DIAGNOSIS — I10 Essential (primary) hypertension: Secondary | ICD-10-CM

## 2013-12-18 DIAGNOSIS — E785 Hyperlipidemia, unspecified: Secondary | ICD-10-CM

## 2013-12-18 MED ORDER — LOSARTAN POTASSIUM 25 MG PO TABS: 25.0000 mg | ORAL_TABLET | Freq: Every day | ORAL | Status: DC

## 2013-12-18 MED ORDER — CARVEDILOL 6.25 MG PO TABS: 6.2500 mg | ORAL_TABLET | Freq: Two times a day (BID) | ORAL | Status: DC

## 2013-12-18 NOTE — Progress Notes (Signed)
HPI   58 year old male who is here today for a followup visit regarding coronary artery disease and chronic systolic heart failure. He was  admitted to [facility] in September with complaints of cough and chest pain.  He ruled out for myocardial infarction.  He underwent stress testing which revealed apical infarct with peri-infarct ischemia.  EF was 31%.  Echocardiogram showed EF 35-40% with anteroseptal, anterior, apical wall motion abnormalities. He underwent cardiac catheterization which revealed an occluded mid LAD with otherwise nonobstructive CAD.  There were faint right to left collaterals supplying the distal LAD distribution.  Medical therapy was optimized with aspirin, beta blocker, ACE inhibitor, and statin therapy, and he was ultimately discharged home.   He was readmitted with fluid overload and was started on Lasix. During most recent visit, I switched lisinopril to losartan due to cough.He then developed persistent hypotension and was hospitalized briefly at [facility] for hypotension and acute renal failure which resolved with IV fluids. He feels back to his normal self but blood pressure has been running high.    No Known Allergies   Current Outpatient Prescriptions on File Prior to Visit  Medication Sig Dispense Refill  . aspirin 81 MG tablet Take 81 mg by mouth daily.    . furosemide (LASIX) 20 MG tablet Take 1 tablet (20 mg total) by mouth daily. 30 tablet 6  . glucagon 1 MG injection Inject 1 mg into the vein once as needed.    . Insulin Glargine (LANTUS SOLOSTAR) 100 UNIT/ML Solostar Pen Inject 20 Units into the skin daily.    . insulin lispro (HUMALOG) 100 UNIT/ML injection Inject 5 Units into the skin 3 (three) times daily with meals.     . isosorbide mononitrate (IMDUR) 30 MG 24 hr tablet Take 1 tablet (30 mg total) by mouth daily. 30 tablet 3  . nitroGLYCERIN (NITROSTAT) 0.4 MG SL tablet Place 0.4 mg under the tongue every 5 (five) minutes as needed  for chest pain.    Marland Kitchen QUEtiapine (SEROQUEL) 100 MG tablet Take 100 mg by mouth at bedtime.    . rosuvastatin (CRESTOR) 20 MG tablet Take 20 mg by mouth daily.     No current facility-administered medications on file prior to visit.     Past Medical History  Diagnosis Date  . Diabetes mellitus without complication   . Hypertension   . Hyperlipidemia   . Depression   . Rotator cuff tear   . CKD (chronic kidney disease), stage III   . Ischemic cardiomyopathy     a. 10/2013 Echo: EF 35-40%, severe distal anteroseptal, anterior, and apical HK. Diast dysfxn, mild conc LVH, mildly dil LA, mild Ao sclerosis w/o stenosis.  Marland Kitchen CAD (coronary artery disease)     a. 10/2013 Lexi MV: EF 31%, apical, septal, ant-apical, inf-apical, lat-apical scar, septal and apical peri-infarct ischemia;  b. 10/2013 Cath: LM nl, LAD 20ost, 114m (faint R->L collats), D1 80p, LCX min irregs, OM1 min irregs, OM2 nl, OM3 30p, RCA 20p, PDA 60, RPL min irregs-->Med Rx.  . Morbid obesity   . CHF (congestive heart failure)      Past Surgical History  Procedure Laterality Date  . Cardiac catheterization  10/19/2013    [facility]     Family History  Problem Relation Age of Onset  . Heart attack Mother   . Colon cancer Father   . Diabetes Brother   . Cancer Brother      History   Social History  .  Marital Status: Married    Spouse Name: N/A    Number of Children: N/A  . Years of Education: N/A   Occupational History  . Not on file.   Social History Main Topics  . Smoking status: Never Smoker   . Smokeless tobacco: Not on file  . Alcohol Use: No  . Drug Use: No  . Sexual Activity: Not on file   Other Topics Concern  . Not on file   Social History Narrative     PHYSICAL EXAM   BP 166/86 mmHg  Pulse 74  Ht 5\' 7"  (1.702 m)  Wt 223 lb 4 oz (101.266 kg)  BMI 34.96 kg/m2 Constitutional: He is oriented to person, place, and time. He appears well-developed and well-nourished. No distress.  HENT: No nasal  discharge.  Head: Normocephalic and atraumatic.  Eyes: Pupils are equal and round.  No discharge. Neck: Normal range of motion. Neck supple. No JVD present. No thyromegaly present.  Cardiovascular: Normal rate, regular rhythm, normal heart sounds. Exam reveals no gallop and no friction rub. No murmur heard.  Pulmonary/Chest: Effort normal and breath sounds normal. No stridor. No respiratory distress. He has no wheezes. He has no rales. He exhibits no tenderness.  Abdominal: Soft. Bowel sounds are normal. He exhibits no distension. There is no tenderness. There is no rebound and no guarding.  Musculoskeletal: Normal range of motion. He exhibits no edema and no tenderness.  Neurological: He is alert and oriented to person, place, and time. Coordination normal.  Skin: Skin is warm and dry. No rash noted. He is not diaphoretic. No erythema. No pallor.  Psychiatric: He has a normal mood and affect. His behavior is normal. Judgment and thought content normal.     EKG: Sinus  Rhythm  -  Nonspecific T-abnormality.   ABNORMAL     ASSESSMENT AND PLAN

## 2013-12-18 NOTE — Patient Instructions (Signed)
Your physician has recommended you make the following change in your medication:  Start Coreg 6.25 mg twice daily  Resume Losartan at 25 mg once daily   Your physician recommends that you return for lab work in:  1 week for Basic metabolic panel   Your physician recommends that you schedule a follow-up appointment in:  1 month   Your next appointment will be scheduled in our new office located at :  [facility]  [address], [address]  [place], [place] [address]

## 2013-12-18 NOTE — Assessment & Plan Note (Signed)
He has no symptoms of angina. Continue medical therapy. 

## 2013-12-18 NOTE — Assessment & Plan Note (Signed)
Adjustment in medications were made as outlined above.

## 2013-12-18 NOTE — Assessment & Plan Note (Signed)
Continue treatment with rosuvastatin with a target LDL of less than 70. 

## 2013-12-18 NOTE — Assessment & Plan Note (Signed)
He appears to be euvolemic but blood pressure has been running high since hospital discharge. I increased carvedilol to 6.25 mg twice daily and resumed losartan at a lower dose of 25 mg once daily. Check basic metabolic profile in one week.

## 2013-12-25 ENCOUNTER — Telehealth: Payer: Self-pay

## 2013-12-25 NOTE — Telephone Encounter (Signed)
  Pt wife called, states this morning at 6:30, pt BP was 189/100, this was before his Losartan. Denies CP. HR was 69.

## 2013-12-25 NOTE — Telephone Encounter (Signed)
Pt wife called, states this morning at 6:30,  pt BP was 189/100, this was before his Losartan. Denies CP. HR was 69.

## 2013-12-25 NOTE — Telephone Encounter (Signed)
LVM 11/16

## 2013-12-26 ENCOUNTER — Other Ambulatory Visit (INDEPENDENT_AMBULATORY_CARE_PROVIDER_SITE_OTHER): Payer: BC Managed Care – PPO

## 2013-12-26 DIAGNOSIS — I5022 Chronic systolic (congestive) heart failure: Secondary | ICD-10-CM

## 2013-12-26 NOTE — Telephone Encounter (Signed)
Patient and wife came in clinic today for labs  They stated patients pressure remains elevated average 170-190/100 He believes he has had a 10 lb weight gain

## 2013-12-27 LAB — BASIC METABOLIC PANEL
BUN/Creatinine Ratio: 18 (ref 9–20)
BUN: 22 mg/dL (ref 6–24)
CO2: 22 mmol/L (ref 18–29)
Calcium: 9.5 mg/dL (ref 8.7–10.2)
Chloride: 106 mmol/L (ref 97–108)
Creatinine, Ser: 1.23 mg/dL (ref 0.76–1.27)
GFR calc non Af Amer: 65 mL/min/{1.73_m2} (ref >59)
GFR, EST AFRICAN AMERICAN: 75 mL/min/{1.73_m2} (ref >59)
Glucose: 86 mg/dL (ref 65–99)
POTASSIUM: 4.9 mmol/L (ref 3.5–5.2)
Sodium: 141 mmol/L (ref 134–144)

## 2013-12-27 MED ORDER — LOSARTAN POTASSIUM 50 MG PO TABS: 50.0000 mg | ORAL_TABLET | Freq: Every day | ORAL | Status: DC

## 2013-12-27 MED ORDER — CARVEDILOL 12.5 MG PO TABS: 12.5000 mg | ORAL_TABLET | Freq: Two times a day (BID) | ORAL | Status: DC

## 2013-12-27 NOTE — Telephone Encounter (Signed)
Patient verbalized understanding  

## 2013-12-27 NOTE — Telephone Encounter (Signed)
Increase Losartan to 50 mg daily and Coreg to 12.5 mg bid.

## 2014-01-01 ENCOUNTER — Telehealth: Payer: Self-pay | Admitting: Cardiovascular Disease

## 2014-01-01 MED ORDER — LOSARTAN POTASSIUM 50 MG PO TABS: 100.0000 mg | ORAL_TABLET | Freq: Every day | ORAL | Status: DC

## 2014-01-01 NOTE — Telephone Encounter (Signed)
Pt wife calling to give bp results and to let us know if we need to change his bp meds. Heart rate is well just not bp,also pt  has lost 3 pounds.  bp is are done one in the morning and one in the night. Below are the readings.   12/27/13 Morning 171/91 HR 75  Evening 142/75 HR 75 12/28/13  Morning 167/88 HR 81 Evening 181/90 HR 71 12/29/13 Morning 180/96 HR 72 Evening 179/94 HR 68 12/30/13 Morning 176/87 HR 68

## 2014-01-01 NOTE — Telephone Encounter (Signed)
Increase Losartan to 100 mg once daily. 

## 2014-01-03 ENCOUNTER — Telehealth: Payer: Self-pay | Admitting: Cardiovascular Disease

## 2014-01-03 ENCOUNTER — Telehealth: Payer: BC Managed Care – PPO | Admitting: Cardiovascular Disease

## 2014-01-03 DIAGNOSIS — I10 Essential (primary) hypertension: Secondary | ICD-10-CM

## 2014-01-03 MED ORDER — FUROSEMIDE 40 MG PO TABS: 60.0000 mg | ORAL_TABLET | Freq: Every day | ORAL | Status: DC

## 2014-01-03 NOTE — Telephone Encounter (Signed)
Spoke w/ pharmacist.  Authorized her to give 90 day supply of lasix to pt.

## 2014-01-03 NOTE — Telephone Encounter (Signed)
-  Patient just underwent cath 10/2013 that showed LM nl, LAD 20ost, 118m (faint R->L collats), D1 80p, LCX min irregs, OM1 min irregs, OM2 nl, OM3 30p, RCA 20p, PDA 60, RPL min irregs-->Med Rx.   -His BP has been running in the SBP 170-190 range even before the recent increase of losartan 48 hours prior (ie: it has not been enough time to see the benefits of losartan).   -Limit fluid and salt intake  -Increase Lasix to 60 mg with planned recheck bmet 11/30 (SCr 1.23)  -If breathing improves by that time would consider titration of Coreg to 25 mg bid

## 2014-01-03 NOTE — Telephone Encounter (Signed)
Pt was having chest pains last night and wife recorded pt and heart rate190/105 hr 102 and then she gave him 100 mg larastain and it came down to 174/93 with a heart rate of 80. They want to know if they need to go er or not.

## 2014-01-03 NOTE — Telephone Encounter (Signed)
pharmacy calling trying to see what dosage is needed to do the refill

## 2014-01-03 NOTE — Telephone Encounter (Signed)
Spoke w/ pt's wife.  She reports that pt's BP is remaining elevated since increase in losartan.  Reports that pt has been SOB since last night. Reports an achy pain in the center of his chest, but he is uncertain if this is from his new onset cough. Pt denies sweating, nausea or vomiting. Pt's wife reports that pt's socks are making an indention in his ankles. Pt does not weigh himself daily. Reports that pt took his lasix this at 7am w/ his other meds. He has had about a 1/2 a 20 oz bottle of water this am.  She reports that pt had pizza for supper last night.  Discussed at length the need to limit pt's sodium and fluid intake.  She reports that pt is noncompliant w/ his diet.  She is concerned that pt needs to go back to ED, as he as recently hospitalized for CHF exacerbation. Advised her that I will send message to Eula Listen, PA for his recommendation.  She is appreciative and will await our call.

## 2014-01-03 NOTE — Telephone Encounter (Signed)
Spoke w/ pt's wife.  Advised her of Ryan's recommendation.  She verbalizes understanding and will call the answering service over the holidays w/ any questions or concerns.  She is agreeable to bringing pt in on Monday for repeat labs.

## 2014-01-08 ENCOUNTER — Other Ambulatory Visit (INDEPENDENT_AMBULATORY_CARE_PROVIDER_SITE_OTHER): Payer: BC Managed Care – PPO

## 2014-01-08 ENCOUNTER — Other Ambulatory Visit: Payer: Self-pay | Admitting: Cardiovascular Disease

## 2014-01-08 DIAGNOSIS — I10 Essential (primary) hypertension: Secondary | ICD-10-CM

## 2014-01-08 DIAGNOSIS — I5022 Chronic systolic (congestive) heart failure: Secondary | ICD-10-CM

## 2014-01-09 LAB — BASIC METABOLIC PANEL
BUN / CREAT RATIO: 23 — AB (ref 9–20)
BUN: 42 mg/dL — ABNORMAL HIGH (ref 6–24)
CHLORIDE: 102 mmol/L (ref 97–108)
CO2: 23 mmol/L (ref 18–29)
Calcium: 8.8 mg/dL (ref 8.7–10.2)
Creatinine, Ser: 1.84 mg/dL — ABNORMAL HIGH (ref 0.76–1.27)
GFR calc non Af Amer: 40 mL/min/{1.73_m2} — ABNORMAL LOW (ref >59)
GFR, EST AFRICAN AMERICAN: 46 mL/min/{1.73_m2} — AB (ref >59)
GLUCOSE: 214 mg/dL — AB (ref 65–99)
POTASSIUM: 4.6 mmol/L (ref 3.5–5.2)
Sodium: 140 mmol/L (ref 134–144)

## 2014-01-11 ENCOUNTER — Telehealth: Payer: Self-pay | Admitting: *Deleted

## 2014-01-11 DIAGNOSIS — I5022 Chronic systolic (congestive) heart failure: Secondary | ICD-10-CM

## 2014-01-11 DIAGNOSIS — I10 Essential (primary) hypertension: Secondary | ICD-10-CM

## 2014-01-11 MED ORDER — CARVEDILOL 25 MG PO TABS: 25.0000 mg | ORAL_TABLET | Freq: Two times a day (BID) | ORAL | Status: DC

## 2014-01-11 MED ORDER — FUROSEMIDE 40 MG PO TABS: 20.0000 mg | ORAL_TABLET | Freq: Every day | ORAL | Status: DC

## 2014-01-11 NOTE — Telephone Encounter (Signed)
LVM to inform patient lab results and to schedule lab

## 2014-01-11 NOTE — Telephone Encounter (Signed)
Patients wife states BP remains elevated  Average 170/90  Per verbal from Dr. Kirke Corin  Increase Coreg to 25 mg twice daily  Patients wife verbalized understanding

## 2014-01-11 NOTE — Telephone Encounter (Signed)
Patients wife returned call  She verbalized understanding

## 2014-01-11 NOTE — Telephone Encounter (Signed)
-----   Message from Iran Ouch, MD sent at 01/11/2014  2:33 PM EST ----- Worsening renal function. Hold Lasix for 3 days then resume at 20 mg once daily. Repeat BMP on Monday.

## 2014-01-13 ENCOUNTER — Encounter: Payer: Self-pay | Admitting: Cardiovascular Disease

## 2014-01-14 ENCOUNTER — Emergency Department: Payer: Self-pay | Admitting: Emergency Medicine

## 2014-01-14 LAB — BASIC METABOLIC PANEL
Anion Gap: 6 — ABNORMAL LOW (ref 7–16)
BUN: 32 mg/dL — AB (ref 7–18)
CO2: 26 mmol/L (ref 21–32)
Calcium, Total: 9.2 mg/dL (ref 8.5–10.1)
Chloride: 111 mmol/L — ABNORMAL HIGH (ref 98–107)
Creatinine: 1.22 mg/dL (ref 0.60–1.30)
EGFR (Non-African Amer.): >60
Glucose: 215 mg/dL — ABNORMAL HIGH (ref 65–99)
Osmolality: 298 (ref 275–301)
Potassium: 4.4 mmol/L (ref 3.5–5.1)
Sodium: 143 mmol/L (ref 136–145)

## 2014-01-14 LAB — PROTIME-INR
INR: 1
PROTHROMBIN TIME: 13.2 s (ref 11.5–14.7)

## 2014-01-14 LAB — CBC
HCT: 34.1 % — ABNORMAL LOW (ref 40.0–52.0)
HGB: 11.3 g/dL — AB (ref 13.0–18.0)
MCH: 30.1 pg (ref 26.0–34.0)
MCHC: 33 g/dL (ref 32.0–36.0)
MCV: 91 fL (ref 80–100)
PLATELETS: 181 10*3/uL (ref 150–440)
RBC: 3.74 10*6/uL — AB (ref 4.40–5.90)
RDW: 14.5 % (ref 11.5–14.5)
WBC: 6.4 10*3/uL (ref 3.8–10.6)

## 2014-01-14 LAB — TROPONIN I: Troponin-I: <0.02 ng/mL

## 2014-01-14 LAB — PRO B NATRIURETIC PEPTIDE: B-Type Natriuretic Peptide: 2150 pg/mL — ABNORMAL HIGH (ref 0–125)

## 2014-01-15 ENCOUNTER — Other Ambulatory Visit (INDEPENDENT_AMBULATORY_CARE_PROVIDER_SITE_OTHER): Payer: BC Managed Care – PPO

## 2014-01-15 ENCOUNTER — Telehealth: Payer: Self-pay | Admitting: Cardiovascular Disease

## 2014-01-15 DIAGNOSIS — I5022 Chronic systolic (congestive) heart failure: Secondary | ICD-10-CM

## 2014-01-15 NOTE — Telephone Encounter (Signed)
Pt wife is calling for patient was in ED and they did blood work. She wants to know if they need to come in for their blood work. Please call patient.

## 2014-01-16 LAB — BASIC METABOLIC PANEL
BUN/Creatinine Ratio: 27 — ABNORMAL HIGH (ref 9–20)
BUN: 31 mg/dL — ABNORMAL HIGH (ref 6–24)
CHLORIDE: 106 mmol/L (ref 97–108)
CO2: 21 mmol/L (ref 18–29)
Calcium: 10 mg/dL (ref 8.7–10.2)
Creatinine, Ser: 1.16 mg/dL (ref 0.76–1.27)
GFR calc Af Amer: 81 mL/min/{1.73_m2} (ref >59)
GFR calc non Af Amer: 70 mL/min/{1.73_m2} (ref >59)
GLUCOSE: 33 mg/dL — AB (ref 65–99)
Potassium: 4.2 mmol/L (ref 3.5–5.2)
Sodium: 144 mmol/L (ref 134–144)

## 2014-01-17 NOTE — Telephone Encounter (Signed)
Informed patient that I have printed his labs from [facility] for Dr. Kirke Corin to review  Patient verbalized understanding

## 2014-01-18 ENCOUNTER — Encounter: Payer: Self-pay | Admitting: Cardiovascular Disease

## 2014-01-18 ENCOUNTER — Ambulatory Visit (INDEPENDENT_AMBULATORY_CARE_PROVIDER_SITE_OTHER): Payer: BC Managed Care – PPO | Admitting: Cardiovascular Disease

## 2014-01-18 VITALS — BP 80/60 | HR 71 | Ht 67.0 in | Wt 233.0 lb

## 2014-01-18 DIAGNOSIS — R079 Chest pain, unspecified: Secondary | ICD-10-CM

## 2014-01-18 DIAGNOSIS — I10 Essential (primary) hypertension: Secondary | ICD-10-CM

## 2014-01-18 DIAGNOSIS — I5022 Chronic systolic (congestive) heart failure: Secondary | ICD-10-CM

## 2014-01-18 DIAGNOSIS — I2510 Atherosclerotic heart disease of native coronary artery without angina pectoris: Secondary | ICD-10-CM

## 2014-01-18 DIAGNOSIS — E785 Hyperlipidemia, unspecified: Secondary | ICD-10-CM

## 2014-01-18 NOTE — Assessment & Plan Note (Signed)
Continue treatment with rosuvastatin with a target LDL of less than 70. 

## 2014-01-18 NOTE — Assessment & Plan Note (Signed)
Losartan was decreased to 50 mg once daily as outlined above.

## 2014-01-18 NOTE — Assessment & Plan Note (Signed)
He has no symptoms of angina. Continue medical therapy. 

## 2014-01-18 NOTE — Patient Instructions (Addendum)
Decrease Losartan 50 mg 2 tablets daily to Losartan 50 mg 1 tablet daily.  Follow up with Dr. Kirke Corin in 2 months.

## 2014-01-18 NOTE — Assessment & Plan Note (Signed)
He appears to be euvolemic . His blood pressure has been extremely difficult to manage given the labile nature of this. I checked his blood pressure in both arms today and it's equal at 90/60. However, blood pressure at home has been running around 150-170 systolic. His machine has been compared to our readings in the past and it was comparable. I I prefer his blood pressure to be somewhat on the high side than being this low. I decreased the dose of losartan to 50 mg once daily.

## 2014-01-18 NOTE — Progress Notes (Signed)
HPI   58 year old male who is here today for a followup visit regarding coronary artery disease and chronic systolic heart failure. He was admitted to [facility] in September with complaints of cough and chest pain.  He ruled out for myocardial infarction.  He underwent stress testing which revealed apical infarct with peri-infarct ischemia.  EF was 31%.  Echocardiogram showed EF 35-40% with anteroseptal, anterior, apical wall motion abnormalities. He underwent cardiac catheterization which revealed an occluded mid LAD with otherwise nonobstructive CAD.  There were faint right to left collaterals supplying the distal LAD distribution.  Medical therapy was optimized with aspirin, beta blocker, ACE inhibitor, and statin therapy, and he was ultimately discharged home.   He was readmitted with fluid overload and was started on Lasix. During most recent visit, I switched lisinopril to losartan due to cough.He then developed persistent hypotension and was hospitalized briefly at [facility] for hypotension and acute renal failure which resolved with IV fluids.  His blood pressure has been very labile. He also had recent issues with hypoglycemia. He had worsening kidney function recently and thus we held his furosemide for few days. [facility] for shortness of breath. Small dose furosemide 20 mg once daily was resumed. He has been feeling better since then. Blood pressure at home is ranging from 150-170 mmHg systolic.    No Known Allergies   Current Outpatient Prescriptions on File Prior to Visit  Medication Sig Dispense Refill  . aspirin 81 MG tablet Take 81 mg by mouth daily.    . carvedilol (COREG) 25 MG tablet Take 1 tablet (25 mg total) by mouth 2 (two) times daily. 180 tablet 3  . glucagon 1 MG injection Inject 1 mg into the vein once as needed.    . Insulin Glargine (LANTUS SOLOSTAR) 100 UNIT/ML Solostar Pen Inject 20 Units into the skin  daily.    . insulin lispro (HUMALOG) 100 UNIT/ML injection Inject 5 Units into the skin 3 (three) times daily with meals.     . isosorbide mononitrate (IMDUR) 30 MG 24 hr tablet Take 1 tablet (30 mg total) by mouth daily. 30 tablet 3  . losartan (COZAAR) 50 MG tablet Take 2 tablets (100 mg total) by mouth daily. 30 tablet 6  . nitroGLYCERIN (NITROSTAT) 0.4 MG SL tablet Place 0.4 mg under the tongue every 5 (five) minutes as needed for chest pain.    Marland Kitchen. QUEtiapine (SEROQUEL) 100 MG tablet Take 100 mg by mouth at bedtime.    . rosuvastatin (CRESTOR) 20 MG tablet Take 20 mg by mouth daily.     No current facility-administered medications on file prior to visit.     Past Medical History  Diagnosis Date  . Diabetes mellitus without complication   . Hypertension   . Hyperlipidemia   . Depression   . Rotator cuff tear   . CKD (chronic kidney disease), stage III   . Ischemic cardiomyopathy     a. 10/2013 Echo: EF 35-40%, severe distal anteroseptal, anterior, and apical HK. Diast dysfxn, mild conc LVH, mildly dil LA, mild Ao sclerosis w/o stenosis.  Marland Kitchen. CAD (coronary artery disease)     a. 10/2013 Lexi MV: EF 31%, apical, septal, ant-apical, inf-apical, lat-apical scar, septal and apical peri-infarct ischemia;  b. 10/2013 Cath: LM nl, LAD 20ost, 16032m (faint R->L collats), D1 80p, LCX min irregs, OM1 min irregs, OM2 nl, OM3 30p, RCA 20p, PDA 60, RPL min irregs-->Med Rx.  .Marland Kitchen  Morbid obesity   . CHF (congestive heart failure)      Past Surgical History  Procedure Laterality Date  . Cardiac catheterization  10/19/2013    [facility]     Family History  Problem Relation Age of Onset  . Heart attack Mother   . Colon cancer Father   . Diabetes Brother   . Cancer Brother      History   Social History  . Marital Status: Married    Spouse Name: N/A    Number of Children: N/A  . Years of Education: N/A   Occupational History  . Not on file.   Social History Main Topics  . Smoking status: Never  Smoker   . Smokeless tobacco: Not on file  . Alcohol Use: No  . Drug Use: No  . Sexual Activity: Not on file   Other Topics Concern  . Not on file   Social History Narrative     PHYSICAL EXAM   BP 80/60 mmHg  Pulse 71  Ht 5\' 7"  (1.702 m)  Wt 233 lb (105.688 kg)  BMI 36.48 kg/m2 Constitutional: He is oriented to person, place, and time. He appears well-developed and well-nourished. No distress.  HENT: No nasal discharge.  Head: Normocephalic and atraumatic.  Eyes: Pupils are equal and round.  No discharge. Neck: Normal range of motion. Neck supple. No JVD present. No thyromegaly present.  Cardiovascular: Normal rate, regular rhythm, normal heart sounds. Exam reveals no gallop and no friction rub. No murmur heard.  Pulmonary/Chest: Effort normal and breath sounds normal. No stridor. No respiratory distress. He has no wheezes. He has no rales. He exhibits no tenderness.  Abdominal: Soft. Bowel sounds are normal. He exhibits no distension. There is no tenderness. There is no rebound and no guarding.  Musculoskeletal: Normal range of motion. He exhibits no edema and no tenderness.  Neurological: He is alert and oriented to person, place, and time. Coordination normal.  Skin: Skin is warm and dry. No rash noted. He is not diaphoretic. No erythema. No pallor.  Psychiatric: He has a normal mood and affect. His behavior is normal. Judgment and thought content normal.     EKG: Sinus  Rhythm  -  Negative T-waves  -Possible  Anterolateral  ischemia.   ABNORMAL     ASSESSMENT AND PLAN

## 2014-01-19 ENCOUNTER — Telehealth: Payer: Self-pay

## 2014-01-19 NOTE — Telephone Encounter (Signed)
Request from [employer], sent to [employer] on 01/22/2014.

## 2014-02-01 ENCOUNTER — Emergency Department: Payer: Self-pay | Admitting: Emergency Medicine

## 2014-02-02 LAB — CBC
HCT: 31 % — ABNORMAL LOW (ref 40.0–52.0)
HGB: 10.3 g/dL — AB (ref 13.0–18.0)
MCH: 30.3 pg (ref 26.0–34.0)
MCHC: 33.3 g/dL (ref 32.0–36.0)
MCV: 91 fL (ref 80–100)
Platelet: 158 10*3/uL (ref 150–440)
RBC: 3.41 10*6/uL — ABNORMAL LOW (ref 4.40–5.90)
RDW: 14.1 % (ref 11.5–14.5)
WBC: 5 10*3/uL (ref 3.8–10.6)

## 2014-02-02 LAB — COMPREHENSIVE METABOLIC PANEL
ALBUMIN: 3.3 g/dL — AB (ref 3.4–5.0)
ANION GAP: 7 (ref 7–16)
AST: 37 U/L (ref 15–37)
Alkaline Phosphatase: 53 U/L
BILIRUBIN TOTAL: 0.3 mg/dL (ref 0.2–1.0)
BUN: 29 mg/dL — AB (ref 7–18)
CHLORIDE: 110 mmol/L — AB (ref 98–107)
CREATININE: 1.15 mg/dL (ref 0.60–1.30)
Calcium, Total: 9.4 mg/dL (ref 8.5–10.1)
Co2: 25 mmol/L (ref 21–32)
EGFR (African American): >60
Glucose: 113 mg/dL — ABNORMAL HIGH (ref 65–99)
OSMOLALITY: 290 (ref 275–301)
Potassium: 4.2 mmol/L (ref 3.5–5.1)
SGPT (ALT): 31 U/L
Sodium: 142 mmol/L (ref 136–145)
TOTAL PROTEIN: 6.5 g/dL (ref 6.4–8.2)

## 2014-02-02 LAB — TROPONIN I: Troponin-I: 0.02 ng/mL

## 2014-02-02 LAB — PRO B NATRIURETIC PEPTIDE: B-TYPE NATIURETIC PEPTID: 1890 pg/mL — AB (ref 0–125)

## 2014-02-05 ENCOUNTER — Telehealth: Payer: Self-pay | Admitting: Cardiovascular Disease

## 2014-02-05 DIAGNOSIS — I5022 Chronic systolic (congestive) heart failure: Secondary | ICD-10-CM

## 2014-02-05 NOTE — Telephone Encounter (Signed)
Pt wife is calling stating pt went to ed 02/01/14 and they told them he had been diagnosed brachiates, but they did blood work and everything was fine. She is confused. Please call patient.

## 2014-02-05 NOTE — Telephone Encounter (Signed)
Pt c/o swelling:  1. How long have you been experiencing swelling?  12/23  2. Where is the swelling located?  Feet and legs bilateral   3.  Are you currently taking a "fluid pill"? 20 mg lasix daily   4.  Are you currently SOB?  Yes   5.  Have you traveled recently? No   Patient was seen in ED for SOB He had gained 10 lbs in 1 week  They said he had heart failure and bronchitis  They gave him an extra 40 mg of lasix x 1 He has lost 4 lbs since he was seen in the ED  Patient stated the SOB had not improved  Swelling has improved slightly

## 2014-02-06 MED ORDER — FUROSEMIDE 40 MG PO TABS: 40.0000 mg | ORAL_TABLET | Freq: Every day | ORAL | Status: DC

## 2014-02-06 NOTE — Telephone Encounter (Signed)
Reviewed Dr. Sheilah Pigeon instructions with patient  Patient verbalized understanding

## 2014-02-06 NOTE — Telephone Encounter (Signed)
Increase lasix to 40 mg once daily. Check BMP in 1 week.

## 2014-02-10 NOTE — Telephone Encounter (Signed)
Patients blood pressure has been lower.  He had some readings during the day which were SBP of 80.  Now greater than 110.  He "just hasn't felt well" but he is in no distress.   He was told to hold his Cozaar until he can be seen by either Tommy Rainwater, MD or Dr. Kirke Corin.  I told her that if she thought that he was in any distress or had any other concerning symptoms that she should call EMS and have him evaluated at the nearest ED.  They should call the office for an appt.early next week.

## 2014-02-11 ENCOUNTER — Inpatient Hospital Stay: Payer: Self-pay | Admitting: Internal Medicine

## 2014-02-11 LAB — CBC WITH DIFFERENTIAL/PLATELET
BASOS ABS: 0.1 10*3/uL (ref 0.0–0.1)
Basophil %: 1.4 %
EOS ABS: 0.2 10*3/uL (ref 0.0–0.7)
EOS PCT: 4.2 %
HCT: 37.6 % — ABNORMAL LOW (ref 40.0–52.0)
HGB: 12.2 g/dL — ABNORMAL LOW (ref 13.0–18.0)
Lymphocyte #: 1 10*3/uL (ref 1.0–3.6)
Lymphocyte %: 23.5 %
MCH: 29.5 pg (ref 26.0–34.0)
MCHC: 32.5 g/dL (ref 32.0–36.0)
MCV: 91 fL (ref 80–100)
MONO ABS: 0.2 x10 3/mm (ref 0.2–1.0)
Monocyte %: 5.1 %
NEUTROS PCT: 65.8 %
Neutrophil #: 2.9 10*3/uL (ref 1.4–6.5)
PLATELETS: 174 10*3/uL (ref 150–440)
RBC: 4.14 10*6/uL — ABNORMAL LOW (ref 4.40–5.90)
RDW: 13.8 % (ref 11.5–14.5)
WBC: 4.4 10*3/uL (ref 3.8–10.6)

## 2014-02-11 LAB — URINALYSIS, COMPLETE
Bacteria: NONE SEEN
Bilirubin,UR: NEGATIVE
Blood: NEGATIVE
GLUCOSE, UR: NEGATIVE mg/dL (ref 0–75)
KETONE: NEGATIVE
Leukocyte Esterase: NEGATIVE
NITRITE: NEGATIVE
Ph: 5 (ref 4.5–8.0)
SPECIFIC GRAVITY: 1.009 (ref 1.003–1.030)
Squamous Epithelial: NONE SEEN
WBC UR: <1 /HPF (ref 0–5)

## 2014-02-11 LAB — HEPATIC FUNCTION PANEL A (ARMC)
ALK PHOS: 67 U/L
ALT: 29 U/L
Albumin: 3.7 g/dL (ref 3.4–5.0)
BILIRUBIN TOTAL: 0.2 mg/dL (ref 0.2–1.0)
SGOT(AST): 35 U/L (ref 15–37)
TOTAL PROTEIN: 6.8 g/dL (ref 6.4–8.2)

## 2014-02-11 LAB — BASIC METABOLIC PANEL
Anion Gap: 10 (ref 7–16)
BUN: 58 mg/dL — ABNORMAL HIGH (ref 7–18)
CALCIUM: 8.8 mg/dL (ref 8.5–10.1)
Chloride: 103 mmol/L (ref 98–107)
Co2: 25 mmol/L (ref 21–32)
Creatinine: 2.33 mg/dL — ABNORMAL HIGH (ref 0.60–1.30)
EGFR (Non-African Amer.): 31 — ABNORMAL LOW
GFR CALC AF AMER: 38 — AB
Glucose: 248 mg/dL — ABNORMAL HIGH (ref 65–99)
OSMOLALITY: 300 (ref 275–301)
Potassium: 4.7 mmol/L (ref 3.5–5.1)
Sodium: 138 mmol/L (ref 136–145)

## 2014-02-11 LAB — TROPONIN I

## 2014-02-12 LAB — BASIC METABOLIC PANEL
Anion Gap: 7 (ref 7–16)
BUN: 55 mg/dL — ABNORMAL HIGH (ref 7–18)
Calcium, Total: 8.8 mg/dL (ref 8.5–10.1)
Chloride: 103 mmol/L (ref 98–107)
Co2: 30 mmol/L (ref 21–32)
Creatinine: 1.92 mg/dL — ABNORMAL HIGH (ref 0.60–1.30)
GFR CALC AF AMER: 47 — AB
GFR CALC NON AF AMER: 39 — AB
Glucose: 155 mg/dL — ABNORMAL HIGH (ref 65–99)
OSMOLALITY: 298 (ref 275–301)
POTASSIUM: 4.4 mmol/L (ref 3.5–5.1)
Sodium: 140 mmol/L (ref 136–145)

## 2014-02-12 LAB — CBC WITH DIFFERENTIAL/PLATELET
BASOS PCT: 1.1 %
Basophil #: 0.1 10*3/uL (ref 0.0–0.1)
EOS ABS: 0.2 10*3/uL (ref 0.0–0.7)
EOS PCT: 4.9 %
HCT: 34.6 % — ABNORMAL LOW (ref 40.0–52.0)
HGB: 11.4 g/dL — ABNORMAL LOW (ref 13.0–18.0)
LYMPHS PCT: 30.1 %
Lymphocyte #: 1.5 10*3/uL (ref 1.0–3.6)
MCH: 29.8 pg (ref 26.0–34.0)
MCHC: 32.9 g/dL (ref 32.0–36.0)
MCV: 91 fL (ref 80–100)
Monocyte #: 0.4 x10 3/mm (ref 0.2–1.0)
Monocyte %: 7 %
Neutrophil #: 2.9 10*3/uL (ref 1.4–6.5)
Neutrophil %: 56.9 %
PLATELETS: 167 10*3/uL (ref 150–440)
RBC: 3.82 10*6/uL — ABNORMAL LOW (ref 4.40–5.90)
RDW: 13.7 % (ref 11.5–14.5)
WBC: 5.1 10*3/uL (ref 3.8–10.6)

## 2014-02-12 LAB — HEMOGLOBIN A1C: Hemoglobin A1C: 7.8 % — ABNORMAL HIGH (ref 4.2–6.3)

## 2014-02-13 ENCOUNTER — Other Ambulatory Visit: Payer: BC Managed Care – PPO

## 2014-02-13 LAB — BASIC METABOLIC PANEL
Anion Gap: 7 (ref 7–16)
BUN: 38 mg/dL — ABNORMAL HIGH (ref 7–18)
CALCIUM: 9.1 mg/dL (ref 8.5–10.1)
Chloride: 107 mmol/L (ref 98–107)
Co2: 27 mmol/L (ref 21–32)
Creatinine: 1.44 mg/dL — ABNORMAL HIGH (ref 0.60–1.30)
EGFR (Non-African Amer.): 54 — ABNORMAL LOW
Glucose: 173 mg/dL — ABNORMAL HIGH (ref 65–99)
Osmolality: 294 (ref 275–301)
Potassium: 4.3 mmol/L (ref 3.5–5.1)
Sodium: 141 mmol/L (ref 136–145)

## 2014-02-14 ENCOUNTER — Telehealth: Payer: Self-pay | Admitting: Cardiovascular Disease

## 2014-02-14 ENCOUNTER — Encounter: Payer: Self-pay | Admitting: *Deleted

## 2014-02-14 NOTE — Telephone Encounter (Signed)
Request from [employer], sent to [employer] on 02/14/14.

## 2014-02-15 ENCOUNTER — Encounter: Payer: Self-pay | Admitting: Cardiovascular Disease

## 2014-02-15 ENCOUNTER — Ambulatory Visit (INDEPENDENT_AMBULATORY_CARE_PROVIDER_SITE_OTHER): Payer: BLUE CROSS/BLUE SHIELD | Admitting: Cardiovascular Disease

## 2014-02-15 VITALS — BP 120/74 | HR 70 | Ht 67.0 in | Wt 232.0 lb

## 2014-02-15 DIAGNOSIS — I10 Essential (primary) hypertension: Secondary | ICD-10-CM

## 2014-02-15 DIAGNOSIS — E785 Hyperlipidemia, unspecified: Secondary | ICD-10-CM

## 2014-02-15 DIAGNOSIS — I2510 Atherosclerotic heart disease of native coronary artery without angina pectoris: Secondary | ICD-10-CM

## 2014-02-15 DIAGNOSIS — I5022 Chronic systolic (congestive) heart failure: Secondary | ICD-10-CM

## 2014-02-15 MED ORDER — FUROSEMIDE 20 MG PO TABS: 20.0000 mg | ORAL_TABLET | Freq: Every day | ORAL | Status: DC | PRN

## 2014-02-15 NOTE — Assessment & Plan Note (Signed)
He has no symptoms of angina. Continue medical therapy. 

## 2014-02-15 NOTE — Assessment & Plan Note (Signed)
Continue treatment with Crestor. Target LDL is less than 70.

## 2014-02-15 NOTE — Progress Notes (Signed)
HPI   59 year old male who is here today for a followup visit regarding coronary artery disease and chronic systolic heart failure. He was admitted to [facility] in September, 2015 with complaints of cough and chest pain.  He ruled out for myocardial infarction.  He underwent stress testing which revealed apical infarct with peri-infarct ischemia.  EF was 31%.  Echocardiogram showed EF 35-40% with anteroseptal, anterior, apical wall motion abnormalities. He underwent cardiac catheterization which revealed an occluded mid LAD with otherwise nonobstructive CAD.  There were faint right to left collaterals supplying the distal LAD distribution.  Medical therapy was optimized with aspirin, beta blocker, ACE inhibitor, and statin therapy, and he was ultimately discharged home.   He had recurrent problems with labile hypertension as well as episodes of fluid overload alternating with volume depletion whenever he is on a diuretic. He went to the emergency room Christmas Eve with shortness of breath due to fluid overload. He was discharged on furosemide. He was hospitalized from January 3 to January 5 with acute renal failure and hypotension. He improved with hydration. He feels back to his baseline. He denies any chest discomfort.    No Known Allergies   Current Outpatient Prescriptions on File Prior to Visit  Medication Sig Dispense Refill  . aspirin 81 MG tablet Take 81 mg by mouth daily.    . carvedilol (COREG) 25 MG tablet Take 1 tablet (25 mg total) by mouth 2 (two) times daily. 180 tablet 3  . digoxin (LANOXIN) 0.125 MG tablet Take 0.125 mg by mouth daily.    Marland Kitchen glucagon 1 MG injection Inject 1 mg into the vein once as needed.    Marland Kitchen guaiFENesin (MUCINEX) 600 MG 12 hr tablet Take 600 mg by mouth every 12 (twelve) hours.    . Insulin Glargine (LANTUS SOLOSTAR) 100 UNIT/ML Solostar Pen Inject 4 Units into the skin daily.     . insulin lispro (HUMALOG) 100 UNIT/ML injection Inject  into the skin 3 (three) times daily with meals.     . isosorbide mononitrate (IMDUR) 30 MG 24 hr tablet Take 1 tablet (30 mg total) by mouth daily. 30 tablet 3  . nitroGLYCERIN (NITROSTAT) 0.4 MG SL tablet Place 0.4 mg under the tongue every 5 (five) minutes as needed for chest pain.    Marland Kitchen QUEtiapine (SEROQUEL) 100 MG tablet Take 100 mg by mouth at bedtime.    . rosuvastatin (CRESTOR) 20 MG tablet Take 20 mg by mouth daily.     No current facility-administered medications on file prior to visit.     Past Medical History  Diagnosis Date  . Diabetes mellitus without complication   . Hypertension   . Hyperlipidemia   . Depression   . Rotator cuff tear   . CKD (chronic kidney disease), stage III   . Ischemic cardiomyopathy     a. 10/2013 Echo: EF 35-40%, severe distal anteroseptal, anterior, and apical HK. Diast dysfxn, mild conc LVH, mildly dil LA, mild Ao sclerosis w/o stenosis.  Marland Kitchen CAD (coronary artery disease)     a. 10/2013 Lexi MV: EF 31%, apical, septal, ant-apical, inf-apical, lat-apical scar, septal and apical peri-infarct ischemia;  b. 10/2013 Cath: LM nl, LAD 20ost, 135m (faint R->L collats), D1 80p, LCX min irregs, OM1 min irregs, OM2 nl, OM3 30p, RCA 20p, PDA 60, RPL min irregs-->Med Rx.  . Morbid obesity   . CHF (congestive heart failure)      Past Surgical History  Procedure Laterality Date  .  Cardiac catheterization  10/19/2013    [facility]     Family History  Problem Relation Age of Onset  . Heart attack Mother   . Colon cancer Father   . Diabetes Brother   . Cancer Brother      History   Social History  . Marital Status: Married    Spouse Name: N/A    Number of Children: N/A  . Years of Education: N/A   Occupational History  . Not on file.   Social History Main Topics  . Smoking status: Never Smoker   . Smokeless tobacco: Not on file  . Alcohol Use: No  . Drug Use: No  . Sexual Activity: Not on file   Other Topics Concern  . Not on file   Social  History Narrative     PHYSICAL EXAM   BP 120/74 mmHg  Pulse 70  Ht 5\' 7"  (1.702 m)  Wt 232 lb (105.235 kg)  BMI 36.33 kg/m2 Constitutional: He is oriented to person, place, and time. He appears well-developed and well-nourished. No distress.  HENT: No nasal discharge.  Head: Normocephalic and atraumatic.  Eyes: Pupils are equal and round.  No discharge. Neck: Normal range of motion. Neck supple. No JVD present. No thyromegaly present.  Cardiovascular: Normal rate, regular rhythm, normal heart sounds. Exam reveals no gallop and no friction rub. No murmur heard.  Pulmonary/Chest: Effort normal and breath sounds normal. No stridor. No respiratory distress. He has no wheezes. He has no rales. He exhibits no tenderness.  Abdominal: Soft. Bowel sounds are normal. He exhibits no distension. There is no tenderness. There is no rebound and no guarding.  Musculoskeletal: Normal range of motion. He exhibits no edema and no tenderness.  Neurological: He is alert and oriented to person, place, and time. Coordination normal.  Skin: Skin is warm and dry. No rash noted. He is not diaphoretic. No erythema. No pallor.  Psychiatric: He has a normal mood and affect. His behavior is normal. Judgment and thought content normal.      ASSESSMENT AND PLAN

## 2014-02-15 NOTE — Patient Instructions (Signed)
Your physician has recommended you make the following change in your medication:  Start Lasix 20 mg once daily for weight gain of >2 lbs in 48 hrs, swelling or shortness of breath  Stop Digoxin   Your physician recommends that you schedule a follow-up appointment in:  Keep your February follow up appointment  Please make sure you bring your blood pressure cuff to that appointment

## 2014-02-15 NOTE — Assessment & Plan Note (Signed)
Blood pressure is well controlled. Home blood pressure readings are elevated. However, I suspect that his machine is not accurate. I asked him to bring the machine with him during follow-up next month.

## 2014-02-15 NOTE — Assessment & Plan Note (Signed)
He has a very narrow euvolemic window. He tends to develop volume depletion very quickly. I think the best option is to use furosemide 20 mg daily as needed based on his weight and symptoms. Given his tendency to develop acute renal failure, I elected to discontinue digoxin to eliminate the chance of toxicity.

## 2014-02-27 ENCOUNTER — Ambulatory Visit: Payer: Self-pay | Admitting: Family

## 2014-03-06 ENCOUNTER — Ambulatory Visit: Payer: BC Managed Care – PPO | Admitting: Cardiovascular Disease

## 2014-03-09 ENCOUNTER — Other Ambulatory Visit: Payer: Self-pay | Admitting: Nurse Practitioner

## 2014-03-15 ENCOUNTER — Ambulatory Visit: Payer: BLUE CROSS/BLUE SHIELD | Admitting: Endocrinology

## 2014-03-20 ENCOUNTER — Ambulatory Visit (INDEPENDENT_AMBULATORY_CARE_PROVIDER_SITE_OTHER): Payer: BLUE CROSS/BLUE SHIELD | Admitting: Cardiovascular Disease

## 2014-03-20 ENCOUNTER — Encounter: Payer: Self-pay | Admitting: Cardiovascular Disease

## 2014-03-20 VITALS — BP 158/96 | HR 72 | Ht 67.0 in | Wt 235.5 lb

## 2014-03-20 DIAGNOSIS — I10 Essential (primary) hypertension: Secondary | ICD-10-CM

## 2014-03-20 DIAGNOSIS — I5022 Chronic systolic (congestive) heart failure: Secondary | ICD-10-CM

## 2014-03-20 DIAGNOSIS — E785 Hyperlipidemia, unspecified: Secondary | ICD-10-CM

## 2014-03-20 DIAGNOSIS — I2510 Atherosclerotic heart disease of native coronary artery without angina pectoris: Secondary | ICD-10-CM

## 2014-03-20 MED ORDER — SACUBITRIL-VALSARTAN 24-26 MG PO TABS: 1.0000 | ORAL_TABLET | Freq: Two times a day (BID) | ORAL | Status: DC

## 2014-03-20 NOTE — Progress Notes (Signed)
HPI   59 year old male who is here today for a followup visit regarding coronary artery disease and chronic systolic heart failure. He was admitted to [facility] in September, 2015 with complaints of cough and chest pain.  He ruled out for myocardial infarction.  He underwent stress testing which revealed apical infarct with peri-infarct ischemia.  EF was 31%.  Echocardiogram showed EF 35-40% with anteroseptal, anterior, apical wall motion abnormalities. He underwent cardiac catheterization which revealed an occluded mid LAD with otherwise nonobstructive CAD.  There were faint right to left collaterals supplying the distal LAD distribution.  He had recurrent problems with labile hypertension as well as episodes of fluid overload alternating with volume depletion whenever he is on a diuretic.  He has been doing reasonably well with no chest pain. Exertional dyspnea is stable. He has minimal leg edema and uses furosemide only as needed.    No Known Allergies   Current Outpatient Prescriptions on File Prior to Visit  Medication Sig Dispense Refill  . aspirin 81 MG tablet Take 81 mg by mouth daily.    . carvedilol (COREG) 25 MG tablet Take 1 tablet (25 mg total) by mouth 2 (two) times daily. 180 tablet 3  . furosemide (LASIX) 20 MG tablet Take 1 tablet (20 mg total) by mouth daily as needed for fluid or edema (take one for weight gain of >2 lbs in 48 hrs, swelling, sob). 30 tablet 6  . glucagon 1 MG injection Inject 1 mg into the vein once as needed.    Marland Kitchen guaiFENesin (MUCINEX) 600 MG 12 hr tablet Take 600 mg by mouth every 12 (twelve) hours.    . Insulin Glargine (LANTUS SOLOSTAR) 100 UNIT/ML Solostar Pen Inject 4 Units into the skin daily.     . insulin lispro (HUMALOG) 100 UNIT/ML injection Inject into the skin 3 (three) times daily with meals.     . isosorbide mononitrate (IMDUR) 30 MG 24 hr tablet TAKE 1 TABLET BY MOUTH DAILY. 30 tablet 3  . losartan (COZAAR) 50 MG  tablet Take 50 mg by mouth daily.    . nitroGLYCERIN (NITROSTAT) 0.4 MG SL tablet Place 0.4 mg under the tongue every 5 (five) minutes as needed for chest pain.    Marland Kitchen QUEtiapine (SEROQUEL) 100 MG tablet Take 100 mg by mouth at bedtime.    . rosuvastatin (CRESTOR) 20 MG tablet Take 20 mg by mouth daily.     No current facility-administered medications on file prior to visit.     Past Medical History  Diagnosis Date  . Diabetes mellitus without complication   . Hypertension   . Hyperlipidemia   . Depression   . Rotator cuff tear   . CKD (chronic kidney disease), stage III   . Ischemic cardiomyopathy     a. 10/2013 Echo: EF 35-40%, severe distal anteroseptal, anterior, and apical HK. Diast dysfxn, mild conc LVH, mildly dil LA, mild Ao sclerosis w/o stenosis.  Marland Kitchen CAD (coronary artery disease)     a. 10/2013 Lexi MV: EF 31%, apical, septal, ant-apical, inf-apical, lat-apical scar, septal and apical peri-infarct ischemia;  b. 10/2013 Cath: LM nl, LAD 20ost, 151m (faint R->L collats), D1 80p, LCX min irregs, OM1 min irregs, OM2 nl, OM3 30p, RCA 20p, PDA 60, RPL min irregs-->Med Rx.  . Morbid obesity   . CHF (congestive heart failure)      Past Surgical History  Procedure Laterality Date  . Cardiac catheterization  10/19/2013    [facility]  Family History  Problem Relation Age of Onset  . Heart attack Mother   . Colon cancer Father   . Diabetes Brother   . Cancer Brother      History   Social History  . Marital Status: Married    Spouse Name: N/A    Number of Children: N/A  . Years of Education: N/A   Occupational History  . Not on file.   Social History Main Topics  . Smoking status: Never Smoker   . Smokeless tobacco: Not on file  . Alcohol Use: No  . Drug Use: No  . Sexual Activity: Not on file   Other Topics Concern  . Not on file   Social History Narrative     PHYSICAL EXAM   BP 158/96 mmHg  Pulse 72  Ht 5\' 7"  (1.702 m)  Wt 235 lb 8 oz (106.822 kg)  BMI  36.88 kg/m2 Constitutional: He is oriented to person, place, and time. He appears well-developed and well-nourished. No distress.  HENT: No nasal discharge.  Head: Normocephalic and atraumatic.  Eyes: Pupils are equal and round.  No discharge. Neck: Normal range of motion. Neck supple. No JVD present. No thyromegaly present.  Cardiovascular: Normal rate, regular rhythm, normal heart sounds. Exam reveals no gallop and no friction rub. No murmur heard.  Pulmonary/Chest: Effort normal and breath sounds normal. No stridor. No respiratory distress. He has no wheezes. He has no rales. He exhibits no tenderness.  Abdominal: Soft. Bowel sounds are normal. He exhibits no distension. There is no tenderness. There is no rebound and no guarding.  Musculoskeletal: Normal range of motion. He exhibits no edema and no tenderness.  Neurological: He is alert and oriented to person, place, and time. Coordination normal.  Skin: Skin is warm and dry. No rash noted. He is not diaphoretic. No erythema. No pallor.  Psychiatric: He has a normal mood and affect. His behavior is normal. Judgment and thought content normal.      ASSESSMENT AND PLAN

## 2014-03-20 NOTE — Assessment & Plan Note (Signed)
Continue treatment with rosuvastatin with a target LDL of less than 70. 

## 2014-03-20 NOTE — Assessment & Plan Note (Signed)
He is doing reasonably well with no symptoms suggestive of angina. Continue medical therapy. 

## 2014-03-20 NOTE — Patient Instructions (Signed)
Your physician has recommended you make the following change in your medication:  Stop Valsartan  Start Entresto 24/26 mg twice daily   Your physician recommends that you return for lab work in:  BMP in one week   Your physician recommends that you schedule a follow-up appointment in:  In 1 month with Dr. Kirke Corin

## 2014-03-20 NOTE — Assessment & Plan Note (Signed)
He appears to be euvolemic. I am switching losartan to Entresto. Check basic metabolic profile in one week. I will attempt up titration in 2-4 weeks. I might consider stopping isosorbide.

## 2014-03-20 NOTE — Assessment & Plan Note (Signed)
Patent stent have labile hypertension. Blood pressure is elevated today. I'm switching him from losartan to [facility] which tends to have more hypotensive effect.

## 2014-03-21 ENCOUNTER — Telehealth: Payer: Self-pay | Admitting: *Deleted

## 2014-03-21 NOTE — Telephone Encounter (Signed)
Spoke with patients wife  Informed her that I am leaving a co pay card at the front desk for her  Instructed patients wife to call if she has any issues with the copay card  Patients wife verbalized understanding

## 2014-03-21 NOTE — Telephone Encounter (Signed)
Pt wife calling stating that they came in yesterday and we gave them samples   And to let us know if insurance did not cover it.   She is calling to let us know they do not cover it  Sherryll Burger is the medicine. It is 500 a month.   They would need [place] to fill out a paper work to help them out with the payment on it.

## 2014-03-27 ENCOUNTER — Other Ambulatory Visit (INDEPENDENT_AMBULATORY_CARE_PROVIDER_SITE_OTHER): Payer: BLUE CROSS/BLUE SHIELD | Admitting: *Deleted

## 2014-03-27 DIAGNOSIS — I5022 Chronic systolic (congestive) heart failure: Secondary | ICD-10-CM

## 2014-03-28 ENCOUNTER — Telehealth: Payer: Self-pay | Admitting: *Deleted

## 2014-03-28 ENCOUNTER — Telehealth: Payer: Self-pay

## 2014-03-28 LAB — BASIC METABOLIC PANEL
BUN / CREAT RATIO: 22 — AB (ref 9–20)
BUN: 40 mg/dL — ABNORMAL HIGH (ref 6–24)
CHLORIDE: 102 mmol/L (ref 97–108)
CO2: 19 mmol/L (ref 18–29)
Calcium: 8.8 mg/dL (ref 8.7–10.2)
Creatinine, Ser: 1.84 mg/dL — ABNORMAL HIGH (ref 0.76–1.27)
GFR calc Af Amer: 46 mL/min/{1.73_m2} — ABNORMAL LOW (ref >59)
GFR calc non Af Amer: 40 mL/min/{1.73_m2} — ABNORMAL LOW (ref >59)
GLUCOSE: 311 mg/dL — AB (ref 65–99)
Potassium: 5.3 mmol/L — ABNORMAL HIGH (ref 3.5–5.2)
SODIUM: 135 mmol/L (ref 134–144)

## 2014-03-28 NOTE — Telephone Encounter (Signed)
Error

## 2014-03-28 NOTE — Telephone Encounter (Signed)
Reviewed results with patient.  Patient stated he has been taking his lasix 5 days in a row because he had some swelling and a cough  I instructed patient to hold off on the lasix and increase his fluid intake  Educated patient on a low potassium diet  He stated he eats a lot of bananas and tomatoes  Instructed patient to follow a low potassium diet  Patient verbalized understanding   Instructed patient to call if swelling, cough or weight gain worsen

## 2014-03-28 NOTE — Telephone Encounter (Signed)
-----   Message from Iran Ouch, MD sent at 03/28/2014 10:49 AM EST ----- Renal function is worse. Make sure he is not taking furosemide regularly and only as needed. He needs to increase his fluid intake. Blood sugar is also elevated. He needs to follow-up with his primary care physician about that. Follow low potassium diet. Repeat basic metabolic profile during his next follow-up.

## 2014-03-28 NOTE — Telephone Encounter (Signed)
Called patient to discuss appointment options. Patient "no showed" appt with Dr. Welford Roche on 03/15/14 and wants to reschedule. Received note from [facility] that patient would like to be seen in [place] in the late afternoon or early morning on feb. 26. That date is not available.

## 2014-03-29 ENCOUNTER — Telehealth: Payer: Self-pay | Admitting: *Deleted

## 2014-03-29 NOTE — Telephone Encounter (Signed)
Reviewed labs with patient.  Patient verbalized understanding.

## 2014-03-29 NOTE — Telephone Encounter (Signed)
Pt states that he would like to know the numbers for his kidney results from blood work.  Spoke to wife but pt would like to know.

## 2014-04-19 ENCOUNTER — Ambulatory Visit (INDEPENDENT_AMBULATORY_CARE_PROVIDER_SITE_OTHER): Payer: BLUE CROSS/BLUE SHIELD | Admitting: Cardiovascular Disease

## 2014-04-19 ENCOUNTER — Encounter: Payer: Self-pay | Admitting: Cardiovascular Disease

## 2014-04-19 VITALS — BP 104/68 | HR 66 | Ht 67.0 in | Wt 226.0 lb

## 2014-04-19 DIAGNOSIS — E785 Hyperlipidemia, unspecified: Secondary | ICD-10-CM

## 2014-04-19 DIAGNOSIS — I10 Essential (primary) hypertension: Secondary | ICD-10-CM

## 2014-04-19 DIAGNOSIS — I2510 Atherosclerotic heart disease of native coronary artery without angina pectoris: Secondary | ICD-10-CM

## 2014-04-19 DIAGNOSIS — I5022 Chronic systolic (congestive) heart failure: Secondary | ICD-10-CM

## 2014-04-19 NOTE — Assessment & Plan Note (Signed)
Blood pressure is slightly low. I discontinued Imdur .

## 2014-04-19 NOTE — Assessment & Plan Note (Signed)
He is doing well overall with no symptoms suggestive of angina. Given relatively low blood pressure, I discontinued isosorbide.

## 2014-04-19 NOTE — Progress Notes (Signed)
HPI   59 year old male who is here today for a followup visit regarding coronary artery disease and chronic systolic heart failure. He was admitted to [facility] in September, 2015 with complaints of cough and chest pain.  He ruled out for myocardial infarction.  He underwent stress testing which revealed apical infarct with peri-infarct ischemia.  EF was 31%.  Echocardiogram showed EF 35-40% with anteroseptal, anterior, apical wall motion abnormalities. He underwent cardiac catheterization which revealed an occluded mid LAD with otherwise nonobstructive CAD.  There were faint right to left collaterals supplying the distal LAD distribution.  He had recurrent problems with labile hypertension as well as episodes of fluid overload alternating with volume depletion whenever he is on a diuretic.  He has been doing reasonably well with no chest pain. Exertional dyspnea is stable.  During last visit, I switched him to [facility]. He has been doing well overall. He was diagnosed with sleep apnea but has not been fitted for CPAP yet.   No Known Allergies   Current Outpatient Prescriptions on File Prior to Visit  Medication Sig Dispense Refill  . aspirin 81 MG tablet Take 81 mg by mouth daily.    . carvedilol (COREG) 25 MG tablet Take 1 tablet (25 mg total) by mouth 2 (two) times daily. 180 tablet 3  . furosemide (LASIX) 20 MG tablet Take 1 tablet (20 mg total) by mouth daily as needed for fluid or edema (take one for weight gain of >2 lbs in 48 hrs, swelling, sob). 30 tablet 6  . glucagon 1 MG injection Inject 1 mg into the vein once as needed.    . Insulin Glargine (LANTUS SOLOSTAR) 100 UNIT/ML Solostar Pen Inject 4 Units into the skin daily.     . insulin lispro (HUMALOG) 100 UNIT/ML injection Inject into the skin 3 (three) times daily with meals.     . isosorbide mononitrate (IMDUR) 30 MG 24 hr tablet TAKE 1 TABLET BY MOUTH DAILY. 30 tablet 3  . nitroGLYCERIN (NITROSTAT) 0.4 MG  SL tablet Place 0.4 mg under the tongue every 5 (five) minutes as needed for chest pain.    Marland Kitchen QUEtiapine (SEROQUEL) 100 MG tablet Take 100 mg by mouth at bedtime.    . rosuvastatin (CRESTOR) 20 MG tablet Take 20 mg by mouth daily.    . sacubitril-valsartan (ENTRESTO) 24-26 MG Take 1 tablet by mouth 2 (two) times daily. 60 tablet 3   No current facility-administered medications on file prior to visit.     Past Medical History  Diagnosis Date  . Diabetes mellitus without complication   . Hypertension   . Hyperlipidemia   . Depression   . Rotator cuff tear   . CKD (chronic kidney disease), stage III   . Ischemic cardiomyopathy     a. 10/2013 Echo: EF 35-40%, severe distal anteroseptal, anterior, and apical HK. Diast dysfxn, mild conc LVH, mildly dil LA, mild Ao sclerosis w/o stenosis.  Marland Kitchen CAD (coronary artery disease)     a. 10/2013 Lexi MV: EF 31%, apical, septal, ant-apical, inf-apical, lat-apical scar, septal and apical peri-infarct ischemia;  b. 10/2013 Cath: LM nl, LAD 20ost, 129m (faint R->L collats), D1 80p, LCX min irregs, OM1 min irregs, OM2 nl, OM3 30p, RCA 20p, PDA 60, RPL min irregs-->Med Rx.  . Morbid obesity   . CHF (congestive heart failure)      Past Surgical History  Procedure Laterality Date  . Cardiac catheterization  10/19/2013    [facility]  Family History  Problem Relation Age of Onset  . Heart attack Mother   . Colon cancer Father   . Diabetes Brother   . Cancer Brother      History   Social History  . Marital Status: Married    Spouse Name: N/A  . Number of Children: N/A  . Years of Education: N/A   Occupational History  . Not on file.   Social History Main Topics  . Smoking status: Never Smoker   . Smokeless tobacco: Not on file  . Alcohol Use: No  . Drug Use: No  . Sexual Activity: Not on file   Other Topics Concern  . Not on file   Social History Narrative     PHYSICAL EXAM   BP 104/68 mmHg  Pulse 66  Ht 5\' 7"  (1.702 m)  Wt 226  lb (102.513 kg)  BMI 35.39 kg/m2 Constitutional: He is oriented to person, place, and time. He appears well-developed and well-nourished. No distress.  HENT: No nasal discharge.  Head: Normocephalic and atraumatic.  Eyes: Pupils are equal and round.  No discharge. Neck: Normal range of motion. Neck supple. No JVD present. No thyromegaly present.  Cardiovascular: Normal rate, regular rhythm, normal heart sounds. Exam reveals no gallop and no friction rub. No murmur heard.  Pulmonary/Chest: Effort normal and breath sounds normal. No stridor. No respiratory distress. He has no wheezes. He has no rales. He exhibits no tenderness.  Abdominal: Soft. Bowel sounds are normal. He exhibits no distension. There is no tenderness. There is no rebound and no guarding.  Musculoskeletal: Normal range of motion. He exhibits no edema and no tenderness.  Neurological: He is alert and oriented to person, place, and time. Coordination normal.  Skin: Skin is warm and dry. No rash noted. He is not diaphoretic. No erythema. No pallor.  Psychiatric: He has a normal mood and affect. His behavior is normal. Judgment and thought content normal.      ASSESSMENT AND PLAN

## 2014-04-19 NOTE — Assessment & Plan Note (Signed)
Continue treatment with rosuvastatin with a target LDL of less than 70. 

## 2014-04-19 NOTE — Assessment & Plan Note (Signed)
He appears to be euvolemic and uses furosemide only as needed. He is tolerating Entresto.  Check basic metabolic profile today. I have avoided spironolactone due to recurrent acute renal failure.

## 2014-04-19 NOTE — Patient Instructions (Signed)
Your physician has recommended you make the following change in your medication:  Stop Imdur   Your physician recommends that you have labs today: BMP  Your physician recommends that you schedule a follow-up appointment in:  2 months

## 2014-04-20 ENCOUNTER — Telehealth: Payer: Self-pay | Admitting: *Deleted

## 2014-04-20 ENCOUNTER — Encounter: Payer: Self-pay | Admitting: *Deleted

## 2014-04-20 LAB — BASIC METABOLIC PANEL
BUN/Creatinine Ratio: 24 — ABNORMAL HIGH (ref 9–20)
BUN: 44 mg/dL — ABNORMAL HIGH (ref 6–24)
CALCIUM: 9.1 mg/dL (ref 8.7–10.2)
CO2: 18 mmol/L (ref 18–29)
Chloride: 104 mmol/L (ref 97–108)
Creatinine, Ser: 1.81 mg/dL — ABNORMAL HIGH (ref 0.76–1.27)
GFR calc Af Amer: 47 mL/min/{1.73_m2} — ABNORMAL LOW (ref >59)
GFR, EST NON AFRICAN AMERICAN: 41 mL/min/{1.73_m2} — AB (ref >59)
GLUCOSE: 256 mg/dL — AB (ref 65–99)
Potassium: 5.6 mmol/L — ABNORMAL HIGH (ref 3.5–5.2)
SODIUM: 138 mmol/L (ref 134–144)

## 2014-04-20 NOTE — Telephone Encounter (Signed)
Cardiac rehab orders faxed.

## 2014-04-20 NOTE — Progress Notes (Signed)
LVM 3/11 

## 2014-04-26 ENCOUNTER — Telehealth: Payer: BLUE CROSS/BLUE SHIELD | Admitting: *Deleted

## 2014-04-26 DIAGNOSIS — I5022 Chronic systolic (congestive) heart failure: Secondary | ICD-10-CM

## 2014-04-26 NOTE — Telephone Encounter (Signed)
Reviewed results with patient. 

## 2014-04-26 NOTE — Telephone Encounter (Signed)
-----   Message from Iran Ouch, MD sent at 04/20/2014 12:01 PM EST ----- Stable renal function. Potassium on blood sugar are elevated. He needs to follow a low potassium diet. Repeat basic metabolic profile in one month.

## 2014-04-30 ENCOUNTER — Telehealth: Payer: Self-pay | Admitting: *Deleted

## 2014-04-30 NOTE — Telephone Encounter (Signed)
Pt wife is asking for Entrestro samples.  He only has enough until Thursday  If they buy it that is 500. They can't do this.  Please advise.

## 2014-04-30 NOTE — Telephone Encounter (Signed)
Samples given of Entresto 24/26 mg.

## 2014-05-14 ENCOUNTER — Telehealth: Payer: Self-pay | Admitting: Cardiovascular Disease

## 2014-05-14 NOTE — Telephone Encounter (Signed)
Patient is still waiting to hear if approved for assistance with Sherryll Burger  But needs samples 24-26 1 tab by mouth twice daily

## 2014-05-15 NOTE — Telephone Encounter (Signed)
Notified patient samples available to pick up for entresto

## 2014-05-15 NOTE — Telephone Encounter (Signed)
Pt wife calling letting us know that the patient assistance program would be faxing [place] something so pt can get one month of rx And pt still needs samples She would like to know when we get this fax as well.

## 2014-05-16 ENCOUNTER — Other Ambulatory Visit: Payer: Self-pay

## 2014-05-16 MED ORDER — SACUBITRIL-VALSARTAN 24-26 MG PO TABS: 1.0000 | ORAL_TABLET | Freq: Two times a day (BID) | ORAL | Status: DC

## 2014-05-16 NOTE — Telephone Encounter (Signed)
Rx printed for entresto 24-26 mg take one tablet twice a day for patient assistance program.

## 2014-05-28 ENCOUNTER — Other Ambulatory Visit: Payer: BLUE CROSS/BLUE SHIELD

## 2014-05-29 ENCOUNTER — Telehealth: Payer: Self-pay | Admitting: *Deleted

## 2014-05-29 NOTE — Telephone Encounter (Signed)
Request from Disability Determination Services , sent to [employer] on 05/29/14 .

## 2014-06-02 NOTE — Consult Note (Signed)
[name], Angel Cruz MR#:  [id number] DATE OF BIRTH:  12/16/1955  DATE OF CONSULTATION:  10/18/2013  REFERRING PHYSICIAN:  Sital P. Juliene Pina, MD.  CONSULTING PHYSICIAN:  Muhammad A. Kirke Corin, MD.  PRIMARY CARE PHYSICIAN: Onnie Boer. Sowles, MD.   REASON FOR CONSULTATION: Chest pain and abnormal stress test.   HISTORY OF PRESENT ILLNESS: This is a 59 year old male with no previous cardiac history. He has chronic medical conditions that include type 2 diabetes, which has been uncontrolled, hypertension, hyperlipidemia and obesity. He presented yesterday with progressive symptoms of exertional shortness of breath, chest tightness and palpitations. This has been going on for about a week and has been getting worse. He has mild orthopnea. He also has been having dry cough. He ruled out for myocardial infarction. He underwent a pharmacologic nuclear stress test, which was highly abnormal with reduced ejection fraction. He denies chest pain at the present time.   PAST MEDICAL HISTORY: 1. Type 2 diabetes.  2. Hypertension.  3. Hyperlipidemia.  4. Depression.  5. Rotator cuff tear.   HOME MEDICATIONS: Include Crestor 20 mg daily, Humalog insulin 3 times daily, Lantus 20 units at bedtime, lisinopril 20 mg daily and Seroquel 100 mg at bedtime.   ALLERGIES: No known drug allergies.   SOCIAL HISTORY: Negative for smoking, alcohol or recreational drug use. He is married and lives with his wife.   FAMILY HISTORY: His mother had myocardial infarction and died as a result.   REVIEW OF SYSTEMS: A 10 point review of systems was performed. It is negative other than what is mentioned in the HPI.   PHYSICAL EXAMINATION: GENERAL: The patient appears to be at his stated age, in no acute distress.  VITAL SIGNS: Temperature 97.5, pulse 71, blood pressure 126/82 and oxygen saturation is 100% on room air.  HEENT: Normocephalic, atraumatic.  NECK: No JVD or carotid bruits.  RESPIRATORY: Normal respiratory  effort with no use of accessory muscles. Auscultation reveals normal breath sounds.  CARDIOVASCULAR: Normal PMI. Normal S1 and S2 with no gallops or murmurs.  ABDOMEN: Benign, nontender and nondistended.  EXTREMITIES: No clubbing, cyanosis or edema.  SKIN: Warm and dry with no rash.  PSYCHIATRIC: He is alert, oriented x 3 with normal mood and affect.   LABORATORY DATA: Creatinine was 1.59 which is close to his baseline. BUN is 32. Cardiac enzymes were negative. CBC showed a hemoglobin of 12.4.   DIAGNOSTIC DATA: ECG showed sinus rhythm with anterolateral T wave changes. Nuclear stress test showed evidence of scar in the apical and septal region and anterior apical and inferoapical regions. Ejection fraction was 31%. There was evidence of peri-infarct ischemia. The stress test was overall high risk.   IMPRESSION: 1. New onset angina with high-risk abnormal nuclear stress test.  2. New onset cardiomyopathy, likely ischemic.  3. Hypertension.  4. Hyperlipidemia.  5. Uncontrolled diabetes.  6. Chronic kidney disease.   RECOMMENDATIONS: The patient had abnormal nuclear stress test. His presentation is consistent with new onset cardiomyopathy and underlying coronary artery disease. He has multiple risk factors. I discussed different management options with him and recommend proceeding with cardiac catheterization and possible coronary intervention. Risks, benefits and alternatives were discussed with the patient and his wife. He does have chronic kidney disease and thus, he is at risk for contrast-induced nephropathy. This was discussed extensively. I am going to start him on hydration. I  will try to minimize contrast use. In the meanwhile, I agree with current medications including aspirin, lisinopril and  carvedilol. Continue treatment with Crestor. Further recommendations to follow after cardiac catheterization.     ____________________________ Chelsea Aus Kirke Corin, MD maa:TT D: 10/18/2013  15:25:12 ET T: 10/18/2013 16:26:41 ET JOB#: [id number]  cc: Muhammad A. Kirke Corin, MD, <Dictator> Iran Ouch MD ELECTRONICALLY SIGNED 10/26/2013 7:32

## 2014-06-02 NOTE — H&P (Signed)
[name], MALLEK MR#:  [id number] DATE OF BIRTH:  02-02-1956  DATE OF ADMISSION:  12/07/2013  PRIMARY CARE PROVIDER: Alba Cory, MD  CHIEF COMPLAINT: Hypotension, weakness, loss of appetite.   HISTORY OF PRESENT ILLNESS: A 59 year old Caucasian male patient with history of CAD, chronic systolic CHF with EF of 35%, hypertension, and diabetes who presents to the Emergency Room, sent in by primary care physician after the patient has had consistently low blood pressure at home. His blood pressure was as low as 73/50 and 68/43, according to the wife when blood pressure was checked at home with their home blood pressure machine. The patient's blood pressure medication was recently changed due to cough. The patient's lisinopril was stopped and he was put on losartan/hydrochlorothiazide combination. Since then he has had loss of appetite, feeling lightheaded, weak, and low blood pressure. Today on calling the PCP, he was sent to the Emergency Room. Here his creatinine has been found to be significantly elevated at 2.37 with BUN of 74 while his baseline is at 1.14 and BUN of 30. The patient also has hyperkalemia at 5.8. Blood pressure low in the Emergency Room at 87/58 and is being admitted to the hospitalist service.   The patient mentions decreased urine output, feeling weak, decreased appetite. No chest pain, shortness of breath, or edema.   PAST MEDICAL HISTORY: 1.  CAD with completely occluded LAD, under medical management.  2.  Hypertension. 3.  Diabetes.  4.  Hyperlipidemia.  5.  Depression.  6.  Rotator cuff tear.  7.  Chronic systolic CHF with EF of 35%.  8.  Ischemic cardiomyopathy.   ALLERGIES: No known drug allergies.   SOCIAL HISTORY: The patient does not smoke. No alcohol. No illicit drug use. Lives at home with his wife. Works at a [employer].   FAMILY HISTORY: Mother is deceased, died from a MI. Father had colon cancer.   HOME MEDICATIONS:  1.  Crestor  20 mg daily.  2.  Humalog 8 units t.i.d. with meals.  3.  Lantus 20 units at bedtime.  4.  Losartan/hydrochlorothiazide 50/12.5 one tablet daily.  5.  Coreg 25 mg b.i.d.  6.  Imdur 60 mg daily.  7.  Seroquel 100 mg at bedtime.   REVIEW OF SYSTEMS: CONSTITUTIONAL: Complains of fatigue, weakness. EYES: No blurred vision, pain or redness.  ENT: No tinnitus, ear pain, hearing loss.  RESPIRATORY: No cough, wheeze, hemoptysis.  CARDIOVASCULAR: No chest pain, orthopnea, or edema. GASTROINTESTINAL: No nausea, vomiting, diarrhea, or abdominal pain.  GENITOURINARY: No dysuria, hematuria, frequency.  ENDOCRINE: No polyuria, nocturia, or thyroid problems. HEMATOLOGIC AND LYMPHATIC: No anemia, easy bruising, bleeding.  INTEGUMENTARY: No acne, rash, lesion.  MUSCULOSKELETAL: No back pain, arthritis.  NEUROLOGIC: No focal numbness, weakness, seizure.  PSYCHIATRIC: Has depression.   PHYSICAL EXAMINATION: VITAL SIGNS: Temperature 98.7, pulse 65, blood pressure 87/58, presently at 100/50, saturating 100% on room air.  GENERAL: Obese, Caucasian male patient lying in bed, overall seems comfortable, conversational, cooperative with exam.  PSYCHIATRIC: Alert and oriented x3. Mood and affect appropriate. Judgment intact.  HEENT: Atraumatic, normocephalic. Oral mucosa dry and pink. External ears and nose normal. No pallor. No icterus. Pupils bilaterally equal and reactive to light. Edentulous.  NECK: Supple. No thyromegaly. No palpable lymph nodes. Trachea midline. No carotid bruit or JVD.  CARDIOVASCULAR: S1 and S2 without any murmurs. Peripheral pulses 2+. No edema. RESPIRATORY: Normal work of breathing. Clear to auscultation on both sides.  GASTROINTESTINAL: Soft abdomen,  nontender. Bowel sounds present. No organomegaly palpable.  SKIN: Warm and dry. No petechiae, rash or ulcers.  MUSCULOSKELETAL: No joint swelling, redness, or effusion in large joints. Normal muscle tone.  NEUROLOGIC: Motor strength  5/5 in upper and lower extremities. Sensory intact all over.  LYMPHATIC: No cervical lymphadenopathy.   DIAGNOSTIC DATA: Glucose 213, BUN 74, creatinine 2.37, sodium 130, potassium 5.8, chloride 107. Troponin less than 0.02.   WBC 5.5, hemoglobin 12.3, platelets 162,000.   ASSESSMENT AND PLAN: 1.  Acute renal failure and hyperkalemia secondary to acute tubular necrosis from hypotension and decreased oral intake. At this point, the patient will be started on IV fluids. He has received 500 mL of normal saline in the Emergency Room. Will put him on 100 per hour and monitoring for any fluid overload secondary to his chronic systolic congestive heart failure and low ejection fraction of 35%. Hold his blood pressure medications. We will continue his Coreg at the lower dose of 12.5 b.i.d., hold Imdur, losartan/hydrochlorothiazide. At the time of discharge, the patient should be able to go home with his medications at the lower dose, including the Coreg, losartan and Imdur. Would hold the hydrochlorothiazide. Continue the patient's other medications at this point. His potassium should improve as his renal failure improves. We will repeat a BMP in the morning. At this point, I expect the patient to receive at least 2 days of inpatient care considering how significantly elevated his BUN and creatinine are and for the hypotension.  2.  Coronary artery disease. Stable. 3.  Chronic systolic congestive heart failure. No signs of heart failure. Hold Lasix and ARB. 4.  Depression. Continue medication.  5.  Deep vein thrombosis prophylaxis with heparin.   CODE STATUS: FULL code.   TIME SPENT ON THIS CASE: 40 minutes. ____________________________ Molinda Bailiff Johara Lodwick, MD srs:sb D: 12/07/2013 16:53:05 ET T: 12/07/2013 17:09:22 ET JOB#: [id number]  cc: Wardell Heath R. Khrista Braun, MD, <Dictator> Muhammad A. Kirke Corin, MD Onnie Boer. Carlynn Purl, MD Orie Fisherman MD ELECTRONICALLY SIGNED 12/14/2013 14:59

## 2014-06-02 NOTE — H&P (Signed)
[name], Angel Cruz MR#:  [id number] DATE OF BIRTH:  1955-08-04  DATE OF ADMISSION:  10/17/2013  PRIMARY CARE PHYSICIAN: Dr. Alba Cory.   CHIEF COMPLAINT: Chest pain and palpitations.   HISTORY OF PRESENT ILLNESS: This is a 59 year old male who presents to the hospital due to palpitations and chest pain, ongoing for the past week or so progressively getting worse. The patient says that his pain is more like a sharp pain in the center of his chest, nonradiating, associated with some shortness of breath and dizziness and also palpitations. He denies any nausea, vomiting, diaphoresis, abdominal pain, or any other associated symptoms. The patient denies ever having an MI before or any recent cardiac workup. He also admits to a cough which is nonproductive, which also exacerbates his chest pain and palpitations.   Given his progressive systems, hospitalist services were contacted for further treatment and evaluation.   REVIEW OF SYSTEMS: CONSTITUTIONAL: No documented fever. No weight gain, no weight loss.  EYES: No blurred or double vision.  ENT: No tinnitus. No postnasal drip. No redness of the oropharynx.  RESPIRATORY: Positive cough. No wheeze. No hemoptysis. No dyspnea.  CARDIOVASCULAR: Positive chest pain. No orthopnea. Positive palpitations. No syncope.  GASTROINTESTINAL: No nausea, no vomiting, diarrhea, no abdominal pain. No melena or hematochezia.  GENITOURINARY: No dysuria or hematuria.  ENDOCRINE: No polyuria or nocturia. No heat or cold intolerance.  HEMATOLOGIC: No anemia, no bruising, no bleeding.  INTEGUMENTARY: No rashes. No lesions.  MUSCULOSKELETAL: No arthritis. No swelling. No gout.  NEUROLOGIC: No numbness, tingling. No ataxia. No seizure-type activity. PSYCHOLOGICAL: No anxiety. No insomnia. No ADD.    PAST MEDICAL HISTORY: Consistent with hypertension, diabetes, hyperlipidemia, depression, history of rotator cuff tear.   ALLERGIES: No known drug allergies.    SOCIAL HISTORY: No smoking. No alcohol abuse. No illicit drug abuse. Lives at home with his wife.   FAMILY HISTORY: Mother is deceased, died from an MI. Father has a history of colon cancer. Brother also had diabetes and died from complications of cancer.   CURRENT MEDICATIONS: As follows: Crestor 20 mg daily, Humalog 8 units t.i.d. before meals, Lantus 20 units at bedtime, lisinopril 20 mg a day, and Seroquel 100 mg at bedtime.   PHYSICAL EXAMINATION: Presently is as follows:  VITAL SIGNS: Temperature is 98.7, pulse 101, respirations 18, blood pressure 154/105, saturation 98% on room air.  GENERAL: The patient is a pleasant-appearing male, no apparent distress.  HEAD, EYES, EARS, NOSE AND THROAT: Atraumatic, normocephalic. Extraocular muscles are intact. Pupils equal and reactive on to light. Sclerae anicteric. No conjunctival injection. No pharyngeal erythema.  NECK: Supple. There is no jugular venous distention. No bruits, no lymphadenopathy, no thyromegaly.  HEART: Regular rate and rhythm. No murmurs. No rubs. No clicks.  LUNGS: Clear to auscultation bilaterally. No rales. No rhonchi. No wheezes.  ABDOMEN: Soft, flat, nontender, nondistended. Has good bowel sounds. No hepatosplenomegaly appreciated.  EXTREMITIES: No evidence of any cyanosis, clubbing, or peripheral edema. Has +2 pedal and radial pulses bilaterally.  NEUROLOGICAL: The patient is alert, awake, and oriented x 3 with no focal motor or sensory deficits appreciated bilaterally.  SKIN: Moist and warm with no rashes appreciated.  LYMPHATIC: There is no cervical or axillary lymphadenopathy.   LABORATORY DATA: Serum glucose of 262, BUN 32, creatinine 1.5, sodium 140, potassium 4.5, chloride 109, bicarbonate 24. Troponin 0.03. White cell count is 8.3, hemoglobin 12.4, hematocrit 37.8, platelet count 217,00.   The patient did have a chest x-ray  done which showed a small right-sided pleural effusion.   ASSESSMENT AND PLAN: This is  a 59 year old male with history of hypertension, diabetes, hyperlipidemia, depression, history of rotator cuff tear, who presents to the hospital with chest pain.  1.  Chest pain. The patient does have risk factors for coronary artery disease given his diabetes and hypertension. For now, I will observe him on off unit telemetry, follow serial cardiac markers, his first set is negative. Continue aspirin, nitroglycerin, statin and morphine. Will get a Myoview in the morning. If his cardiac markers turn positive, will have to cancel his stress test and get a cardiology consult.   2.  Hypertension, presently hemodynamically stable. Continue with the lisinopril.  3.  Diabetes. Continue with his Lantus and sliding scale insulin.  4.  Hyperlipidemia. Continue Crestor.  5.  Depression. Continue with his Seroquel.  6.  Chronic kidney disease stage III, creatinine currently at baseline.  7.  The patient is a full code.   TIME SPENT ON ADMISSION: 45 minutes.    ____________________________ Rolly Pancake. Cherlynn Kaiser, MD vjs:at D: 10/17/2013 12:09:01 ET T: 10/17/2013 12:22:06 ET JOB#: [id number]  cc: Rolly Pancake. Cherlynn Kaiser, MD, <Dictator> Houston Siren MD ELECTRONICALLY SIGNED 10/18/2013 15:55

## 2014-06-02 NOTE — Consult Note (Signed)
Brief Consult Note: Diagnosis: angina with high risk abnormal stress test.   Patient was seen by consultant.   Consult note dictated.   Comments: cardiac cath tomorrow (11:30 am).  Electronic Signatures: Lorine Bears (MD)  (Signed 09-Sep-15 14:11)  Authored: Brief Consult Note   Last Updated: 09-Sep-15 14:11 by Lorine Bears (MD)

## 2014-06-02 NOTE — Discharge Summary (Signed)
[name], Angel Cruz MR#:  [id number] DATE OF BIRTH:  Oct 12, 1955  DATE OF ADMISSION:  10/17/2013 DATE OF DISCHARGE:    ADMISSION DIAGNOSIS: Chest pain.   DISCHARGE DIAGNOSES: 1.  Unstable angina 2.  Hypertension.  3.  Diabetes.  4.  Hyperlipidemia.  5.  Depression.  6.  Chronic kidney stage III.  7.    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   MYOVIEW anterolateral ischemia  ECHO EF 35-40% mild LVH and diastolic dysfunction  CARDIAC CATH There was severe 1-vessel coronary artery disease with an occluded mid LAD. Faint right to left and left to left collaterals.   HOSPITAL COURSE: This is a 59 year old male with a history of hypertension, diabetes, who presented with chest pain and palpitations. For further details, please refer to the H and P.  1.  Unstable angina. The patient was admitted for chest pain with unstable angina. His troponins x 3 were negative. He underwent a Myoview stress test which was high risk. He therefore underwent a cardiac cath which showed severe 1-vessel coronary artery disease with an occluded mid LAD. The plan is to treat with agressive mediation. He i s a beta blocker, lisinopril , asa and statin.   2.  Hypertension, well controlled on lisinopril.  3.  Diabetes. The patient is to continue on his outpatient medications.  4.  Chronic kidney disease stage III, which was stable.  5.  Depression. The patient is on Seroquel.  6.  Hyperlipidemia. The patient was continued on Crestor.  7.   Ischemic cardiomyopathy patient underwent cardiac cath as discussed above. plan is for medical management with BB, and ACEI. 8.    Hyperlipidemia: we could not calculate LDL as the TG level wa elevated. We started fish oil tablets.  DISCHARGE MEDICATIONS: 1.  Lisinopril 20 mg daily.  2.  Humalog 8 units subcutaneous 3 times a day before meals.  3.  Lantus 20 units daily.  4.  Crestor 20 mg daily.  5.   Aspirin 81 mg daily.  6.  Quetiapine 100 mg in the evening.  7.  Nitroglycerin sublingual q. 5 minutes p.r.n.  8.    Coreg 6.25 mg po BID 9.    Fish Oil 1000 mg PO BID  DISCHARGE DIET: ADA diet, low-sodium.   DISCHARGE ACTIVITY: As tolerated.   DISCHARGE FOLLOWUP: The patient should follow up with his primary care physician in 1 week.   TIME SPENT: 35 minutes on discharge.    ____________________________ Jayvon Mounger P. Juliene Pina, MD spm:at D: 10/18/2013 12:43:22 ET T: 10/18/2013 13:32:07 ET JOB#: [id number]  cc: Forrest Jaroszewski P. Juliene Pina, MD, <Dictator> Onnie Boer. Carlynn Purl, MD Janyth Contes Keefe Zawistowski MD ELECTRONICALLY SIGNED 10/20/2013 12:28

## 2014-06-02 NOTE — Discharge Summary (Signed)
PATIENT NAME:  LLIAM, MARELLA MR#:  [id number] DATE OF BIRTH:  09-16-1955  DATE OF ADMISSION:  10/18/2013  DATE OF DISCHARGE:  10/20/2013  ADDENDUM:   The patient was discharged 10/20/2013.      DISCHARGE DIAGNOSES:  1.  Unstable angina.  2.  Cardiomyopathy 35%, ischemic in nature.  3.  Diabetes.   He underwent a cardiac catheterization which showed severe 1 vessel coronary artery disease as an occluded mid LAD.  Please refer to the already dictated discharge summary.     ____________________________ Janyth Contes. Juliene Pina, MD spm:DT D: 10/20/2013 12:06:51 ET T: 10/20/2013 13:25:21 ET JOB#: [id number]  cc: Tashianna Broome P. Juliene Pina, MD, <Dictator> Janyth Contes Curt Oatis MD ELECTRONICALLY SIGNED 10/20/2013 15:45

## 2014-06-02 NOTE — Discharge Summary (Signed)
[name], Angel Cruz MR#:  [id number] DATE OF BIRTH:  [birth year]  DATE OF ADMISSION:  12/07/2013 DATE OF DISCHARGE:  12/09/2013  PRIMARY CARE PHYSICIAN: Onnie Boer. Sowles, MD.   CARDIOLOGISTJerolyn Center A. Kirke Corin, MD.  FINAL DIAGNOSES:  1. Acute renal failure, acute tubular necrosis with hypotension.  2. History of systolic congestive heart failure. On this hospital stay, there were no signs of congestive heart failure.  3. Hyperkalemia.  4. Hyperlipidemia.  5. Diabetes.  6. History of coronary artery disease.   MEDICATIONS ON DISCHARGE: Include Crestor 20 mg at bedtime, aspirin 81 mg daily, nitroglycerin 0.4 mg every 5 minutes as needed for chest pain, Imdur 30 mg daily, furosemide 20 mg daily, glucagon 1 mg injectable powder as needed for hypoglycemia, Humalog FlexPen 5 units subcutaneous injection 3 times before meals, Seroquel 100 mg at bedtime, Coreg 3.125 mg twice a day, Lantus 6 units subcutaneous injection at bedtime, Glucerna shake 237 mL 3 times a day.   DIET: Low sodium, carbohydrate-controlled diet, regular consistency.  Follow-up with Dr. Kirke Corin 1 to 2 weeks, 1 to 2 weeks with Dr. Carlynn Purl.   HOSPITAL COURSE: The patient was admitted 12/07/2013, discharged 12/09/2013. Came in with hypotension, weakness, loss of appetite. The patient was admitted with acute renal failure, hyperkalemia, was given IV fluids. His Coreg dose was decreased.  Laboratory and radiological data during the hospital course  included an EKG that showed normal sinus rhythm, nonspecific ST-T wave changes. Troponin was negative. Glucose 213, BUN 74, creatinine 2.37, sodium 138, potassium 5.8, chloride 107, CO2 of 22, calcium 8.6. White blood cell count 5.5, hemoglobin and hematocrit 12.3 and 37.9, platelet count of 162. Urinalysis negative. Creatinine next day down to 1.6. Creatinine upon discharge 1.12.   HOSPITAL COURSE PER PROBLEM LIST:  1. For the patient's acute renal failure, likely acute tubular  necrosis with hypotension, his Coreg dose was decreased and I decreased it again upon discharge down to  3.125 mg twice a day. He was given IV fluids during the entire hospital course. No signs of congestive heart failure. Creatinine improved down to 1.12, stable for discharge.  2. History of systolic congestive heart failure. On this hospital stay, there were no signs. His Coreg dose was decreased down to 3.125 mg twice a day. He can restart low-dose Lasix as outpatient.  3. Hyperkalemia, likely secondary to elevated creatinine, improved. Recommend checking as outpatient  4. Hyperlipidemia, on Crestor.  5. Diabetes. I decreased his Lantus dose. He is on sliding scale prior to meals.  6. History of coronary artery disease on aspirin, Imdur, and low-dose Coreg. ACE inhibitor and spironolactone not given for heart failure secondary to chronic kidney disease and acute renal failure on this presentation and hyperkalemia.  TIME SPENT ON DISCHARGE: 35 minutes.   ____________________________ Herschell Dimes. Renae Gloss, MD rjw:jh D: 12/09/2013 11:37:22 ET T: 12/10/2013 11:16:16 ET JOB#: [id number]  cc: Herschell Dimes. Renae Gloss, MD, <Dictator> Onnie Boer. Carlynn Purl, MD Salley Scarlet MD ELECTRONICALLY SIGNED 12/11/2013 13:29

## 2014-06-04 ENCOUNTER — Telehealth: Payer: Self-pay | Admitting: *Deleted

## 2014-06-04 NOTE — Telephone Encounter (Signed)
Pt calling about lab work.  Please advise.

## 2014-06-10 NOTE — H&P (Signed)
[name] MR#:  [id number] DATE OF BIRTH:  1955/02/28   REFERRING EMERGENCY ROOM PHYSICIAN: Angel Pih, MD  PRIMARY CARE PHYSICIAN:  Angel Boer. Sowles, MD  PRIMARY CARDIOLOGIST: Angel Aus. Kirke Corin, MD  CHIEF COMPLAINT: Fatigue and weakness.   HISTORY OF PRESENT ILLNESS: The history is provided by the patient's wife as the patient states that he feels fine, maybe a little weak and is unable to give much more of a history than that. The wife reports that they came to the Emergency Room on December 24 for bilateral lower extremity edema and at that time his daily Lasix was increased from 20-40 mg. Since that time, the edema has resolved and yesterday she noted that his blood pressure was fairly low. His blood pressures are usually in the 180s/90s even on his blood pressure medications and yesterday they were running in the 90s/40s. She noted that he seemed lethargic and was not moving around much. When blood pressure continued to be low today she brought him to the Emergency Room for further evaluation. She also notes that about 3 weeks ago he became acutely hypoglycemic with a blood sugar of 33 and at that time, he stopped taking insulin. Blood sugars are now back up in the 200s, but he has not been receiving any insulin since 3 weeks ago.   PAST MEDICAL HISTORY: 1.  Diabetes mellitus type 2.  2.  Hypertension.  3.  Congestive heart failure, combined diastolic and systolic with an ejection fraction of 35%- 40%.  4.  Obesity.  5.  Depression.  6.  Hyperlipidemia.  PAST SURGICAL HISTORY: 1.  Left elbow surgery.  2.  Rotator cuff surgery.  3.  Cardiac ablation.  4.  Carpal tunnel release.   SOCIAL HISTORY: The patient lives with his wife at home. He does not use a cane or walker for ambulation. Denies smoking, alcohol use, or any illicit substance abuse.   FAMILY MEDICAL HISTORY: Positive for coronary artery disease in his mother and colon cancer in his father. He has a  brother with diabetes.   ALLERGIES: No known allergies.   HOME MEDICATIONS: 1.  Seroquel 100 mg twice a day.  2.  Mucinex 600 mg 1 tablet every 12 hours.  3.  Losartan 50 mg 1 tablet once a day.  4.  Isosorbide mononitrate 1 tablet daily.  5.  Furosemide 40 mg 1 tablet daily.  6.  Crestor 20 mg 1 tablet daily.  7.  Carvedilol 25 mg 1 tablet twice a day.  8.  Aspirin 81 mg daily.   REVIEW OF SYSTEMS:   CONSTITUTIONAL:  Negative for fevers or chills. Positive for fatigue and weakness. No pain. Positive for weight loss over the past week.  HEENT: No change in vision or hearing. No pain in the eyes or ears, no difficulty swallowing. No postnasal drip or sore throat.  RESPIRATORY: No shortness of breath, cough, wheezing, or hemoptysis.  CARDIOVASCULAR: No chest pain, palpitations or orthopnea. Edema has resolved. No syncope.  GASTROINTESTINAL: No nausea, vomiting, diarrhea, or abdominal pain.  GENITOURINARY: No dysuria or hematuria.  HEMATOLOGIC: No easy bruising or bleeding; no swollen glands.  MUSCULOSKELETAL: No new pain in the neck, back, shoulders, knees, or hips. No gout.  NEUROLOGIC: No focal numbness or weakness. No confusion, no dysarthria. No CVA, no  seizure, no headache.  PSYCHIATRIC: He does have a history of depression which is controlled.  No bipolar disorder or schizophrenia.   PHYSICAL EXAMINATION: VITAL SIGNS: Temperature  98, pulse 70, respirations 18, blood pressure 111/66, oxygenation 97% on room air.  HEENT: Pupils equal, round, and reactive to light. Extraocular motion is intact. Conjunctivae are clear. Oral mucous membranes are pink and dry, posterior oropharynx is clear. No exudate, erythema, or edema. Fair dentition. Trachea is midline.  NECK: Supple. No cervical lymphadenopathy.  RESPIRATORY: Lungs clear to auscultation bilaterally with good air movement.  CARDIOVASCULAR: Regular rate and rhythm. No murmurs, rubs, or gallops. No peripheral edema. Peripheral pulses  are 2+.  ABDOMEN: Obese, soft, nontender, nondistended. Bowel sounds are normal. No guarding, no rebound, no hepatosplenomegaly no mass.  MUSCULOSKELETAL: No joint effusions, range of motion is normal. Strength is 5/5 throughout.  SKIN: No rash, no open lesions. No blistering, no wounds.  NEUROLOGIC: Cranial nerves II through XII grossly intact. Strength and sensation intact, tone is normal.  PSYCHIATRIC: The patient is alert and oriented. He has fair insight into his clinical condition, but defers to his wife for most questions. No signs of uncontrolled depression or anxiety.   LABORATORY DATA: Sodium 138, potassium 4.7, chloride 103, bicarbonate 25, BUN 58, creatinine 2.3, glucose 248. LFTs are normal. Troponin less than 0.02. White blood cells 4.4, hemoglobin 12.2, platelets 174,000, MCV is 91. Urinalysis is negative for signs of infection, but is positive for protein at 30 mg/dL.   IMAGING: Chest x-ray shows no active cardiopulmonary disease.  ASSESSMENT AND PLAN: Problem #1.  Acute renal failure. The patient's creatinine has gone from 1.5-2.3 over the past week. This is likely due to recent increase in Lasix dosing. We will rehydrate gently to improve renal function. Urinalysis shows no signs of infection, does show some protein which is consistent with dehydration.  Problem #2.  Hypotension: This is due to overdiuresis. Will check orthostatics in the morning to be sure that he is not continuing to be orthostatic before discharge.  Problem #3.  Congestive heart failure with ejection fraction of 35%-40% by 2-D echocardiogram in September 2015 along with diastolic dysfunction. He has been working with his cardiologist to find the appropriate balance of diuresis and preserved renal function. At this time I am hydrating gently. He does not have a congestive heart failure exacerbation on admission. Monitor carefully on a low sodium diet daily weights, and monitor ins and outs. Problem #4.  Diabetes  mellitus type 2: Check a hemoglobin A1c.  He reports that he was on insulin prior to 3 weeks ago. I do not have his doses. His wife states that she was using his carbohydrate intake to determine his insulin dose, but for the past 3 weeks he has not had any insulin.  I will put him on a sliding scale while he is inpatient and a carbohydrate-modified diet.  Problem #5.  Hypertension: The patient is hypotensive. I will hold his antihypertensive medications for now.  Problem #6.  Depression: This is stable. We will continue with Seroquel.  Problem #7.  Hyperlipidemia: Continue with Crestor.  Problem #8.  Prophylaxis: Heparin for deep vein thrombosis prophylaxis. No gastrointestinal prophylaxis needed at this time.  TIME SPENT ON ADMISSION: 45 minutes.    ____________________________ Ena Dawley. Clent Ridges, MD cpw:LT D: 02/11/2014 19:47:24 ET T: 02/11/2014 20:26:36 ET JOB#: [id number]  cc: Ena Dawley. Clent Ridges, MD, <Dictator> Gale Journey MD ELECTRONICALLY SIGNED 02/20/2014 13:14

## 2014-06-10 NOTE — Discharge Summary (Signed)
[name], Angel Cruz MR#:  [id number] DATE OF BIRTH:  1955/06/12  DATE OF ADMISSION:  02/11/2014 DATE OF DISCHARGE:  02/13/2014  ADMITTING DIAGNOSIS:  Fatigueness, weakness.   DISCHARGE DIAGNOSES: 1.  Fatigueness and weakness due to acute renal failure, as well as hypotension.  2.  Acute renal failure due to overdiuresis. Now, the patient's symptoms have improved with IV hydration.  3.  Hypotension, again due to overdiuresis. The patient's blood pressure is now elevated. His blood pressure medications have resumed.  4.  Chronic systolic congestive heart failure. The patient is referred to the congestive heart failure clinic. He will also need to be followed up by his cardiologist. At this time, Lasix has been held. 5.  Diabetes type 2.  6.  Hypertension.  7.  Depression.  8.  Hyperlipidemia.  9.  Morbid obesity.  10.  Status post left elbow surgery.  11.  Status post rotator cuff repair.  12.  Status post cardiac ablation.  13.  Carpal tunnel release.   CONSULTANTS:  None.   PERTINENT LABS AND EVALUATIONS:  Admitting glucose was 248, BUN 58, creatinine 2.33, sodium 138, potassium 4.7, chloride 103, CO2 of 25, calcium 8.8, and most recent creatinine on discharge was 1.44. LFTs were normal. Troponin was less than 0.02. WBC was 4.4, hemoglobin 12.2, platelet count 174,000.   Chest x-ray showed no active cardiopulmonary disease.   HOSPITAL COURSE:  Please refer to H and P done by the admitting physician. The patient is a 59 year old with history of systolic congestive heart failure, who presented to the hospital basically with fatigueness and weakness. The patient was noted to have elevated creatinine, as well as blood pressure in the 90s. The patient was noted to have acute renal failure. He was admitted for IV hydration, and his Lasix and blood pressure medications were held. After holding his blood pressure medications and receiving fluids, his blood pressure started to improve.  Currently, his blood pressure is actually elevated today. The patient otherwise is doing well and is feeling much better. Due to issues with fluid overload, he is referred to the congestive heart failure clinic.   DISCHARGE MEDICATIONS:  Crestor 20 daily, aspirin 81 one tab daily, isosorbide mononitrate 30 daily, glucagon as needed for low blood sugar, carvedilol 25 one tab p.o. b.i.d., Mucinex 600 one tab p.o. q. 12 hours, Seroquel 100 one tab p.o. b.i.d., losartan 50 daily, digoxin 125 mcg daily, Lantus 4 units at bedtime, home oxygen.   DIET:  Low sodium, low fat, low cholesterol, carbohydrate-controlled diet.   ACTIVITY:  As tolerated.   FOLLOWUP:  With Dr. Mariah Milling this Friday and with primary MD in 1 to 2 weeks.   TIME SPENT ON THIS DISCHARGE:  35 minutes.   ____________________________ Lacie Scotts Allena Katz, MD shp:nb D: 02/13/2014 20:34:28 ET T: 02/14/2014 01:34:23 ET JOB#: [id number]  cc: Meiko Stranahan H. Allena Katz, MD, <Dictator> Charise Carwin MD ELECTRONICALLY SIGNED 02/17/2014 9:25

## 2014-06-12 NOTE — Telephone Encounter (Signed)
Patient notified the patient assistance for Angel Cruz has been approved and now eligible to receive Entresto until 06/05/2015 at no out-of-pocket cost as long as all program eligibility criteria.

## 2014-06-19 ENCOUNTER — Encounter: Payer: Self-pay | Admitting: Cardiovascular Disease

## 2014-06-19 ENCOUNTER — Ambulatory Visit (INDEPENDENT_AMBULATORY_CARE_PROVIDER_SITE_OTHER): Payer: BLUE CROSS/BLUE SHIELD | Admitting: Cardiovascular Disease

## 2014-06-19 VITALS — BP 84/60 | HR 68 | Ht 67.0 in | Wt 216.8 lb

## 2014-06-19 DIAGNOSIS — I2510 Atherosclerotic heart disease of native coronary artery without angina pectoris: Secondary | ICD-10-CM | POA: Diagnosis not present

## 2014-06-19 DIAGNOSIS — I5022 Chronic systolic (congestive) heart failure: Secondary | ICD-10-CM | POA: Diagnosis not present

## 2014-06-19 DIAGNOSIS — I10 Essential (primary) hypertension: Secondary | ICD-10-CM

## 2014-06-19 DIAGNOSIS — E785 Hyperlipidemia, unspecified: Secondary | ICD-10-CM

## 2014-06-19 MED ORDER — CARVEDILOL 25 MG PO TABS: 12.5000 mg | ORAL_TABLET | Freq: Two times a day (BID) | ORAL | Status: DC

## 2014-06-19 NOTE — Assessment & Plan Note (Signed)
He has no symptoms of angina. Continue medical therapy. 

## 2014-06-19 NOTE — Assessment & Plan Note (Signed)
He continues to have symptomatic hypotension. His blood pressure tends to be very labile. I decreased the dose of carvedilol to 12.5 mg twice daily.

## 2014-06-19 NOTE — Patient Instructions (Signed)
Decrease Carvedilol (Coreg) to 12.5 mg twice daily.  Continue other medications.   Follow up in 3 months. Come fasting for labs.

## 2014-06-19 NOTE — Assessment & Plan Note (Signed)
Blood pressure is low. Carvedilol was decreased as outlined above.

## 2014-06-19 NOTE — Progress Notes (Signed)
HPI   59 year old male who is here today for a followup visit regarding coronary artery disease and chronic systolic heart failure. He was admitted to [facility] in September, 2015 with complaints of cough and chest pain.  He ruled out for myocardial infarction.  He underwent stress testing which revealed apical infarct with peri-infarct ischemia.  EF was 31%.  Echocardiogram showed EF 35-40% with anteroseptal, anterior, apical wall motion abnormalities. He underwent cardiac catheterization which revealed an occluded mid LAD with otherwise nonobstructive CAD.  There were faint right to left collaterals supplying the distal LAD distribution.  He had recurrent problems with labile hypertension as well as episodes of fluid overload alternating with volume depletion whenever he is on a diuretic.  He has been doing reasonably well with no chest pain. Exertional dyspnea is stable.  During last visit, I discontinued isosorbide due to low blood pressure. He continues to complain of intermittent dizziness and presyncope. He has not used any diuretics recently.   No Known Allergies   Current Outpatient Prescriptions on File Prior to Visit  Medication Sig Dispense Refill  . aspirin 81 MG tablet Take 81 mg by mouth daily.    . carvedilol (COREG) 25 MG tablet Take 1 tablet (25 mg total) by mouth 2 (two) times daily. 180 tablet 3  . furosemide (LASIX) 20 MG tablet Take 1 tablet (20 mg total) by mouth daily as needed for fluid or edema (take one for weight gain of >2 lbs in 48 hrs, swelling, sob). 30 tablet 6  . glucagon 1 MG injection Inject 1 mg into the vein once as needed.    . Insulin Glargine (LANTUS SOLOSTAR) 100 UNIT/ML Solostar Pen Inject 4 Units into the skin as needed.     . insulin lispro (HUMALOG) 100 UNIT/ML injection Inject into the skin as needed.     . nitroGLYCERIN (NITROSTAT) 0.4 MG SL tablet Place 0.4 mg under the tongue every 5 (five) minutes as needed for chest  pain.    Marland Kitchen QUEtiapine (SEROQUEL) 100 MG tablet Take 100 mg by mouth at bedtime.    . rosuvastatin (CRESTOR) 20 MG tablet Take 20 mg by mouth daily.    . sacubitril-valsartan (ENTRESTO) 24-26 MG Take 1 tablet by mouth 2 (two) times daily. 60 tablet 3   No current facility-administered medications on file prior to visit.     Past Medical History  Diagnosis Date  . Diabetes mellitus without complication   . Hypertension   . Hyperlipidemia   . Depression   . Rotator cuff tear   . CKD (chronic kidney disease), stage III   . Ischemic cardiomyopathy     a. 10/2013 Echo: EF 35-40%, severe distal anteroseptal, anterior, and apical HK. Diast dysfxn, mild conc LVH, mildly dil LA, mild Ao sclerosis w/o stenosis.  Marland Kitchen CAD (coronary artery disease)     a. 10/2013 Lexi MV: EF 31%, apical, septal, ant-apical, inf-apical, lat-apical scar, septal and apical peri-infarct ischemia;  b. 10/2013 Cath: LM nl, LAD 20ost, 159m (faint R->L collats), D1 80p, LCX min irregs, OM1 min irregs, OM2 nl, OM3 30p, RCA 20p, PDA 60, RPL min irregs-->Med Rx.  . Morbid obesity   . CHF (congestive heart failure)      Past Surgical History  Procedure Laterality Date  . Cardiac catheterization  10/19/2013    [facility]     Family History  Problem Relation Age of Onset  . Heart attack Mother   . Colon cancer Father   .  Diabetes Brother   . Cancer Brother      History   Social History  . Marital Status: Married    Spouse Name: N/A  . Number of Children: N/A  . Years of Education: N/A   Occupational History  . Not on file.   Social History Main Topics  . Smoking status: Never Smoker   . Smokeless tobacco: Not on file  . Alcohol Use: No  . Drug Use: No  . Sexual Activity: Not on file   Other Topics Concern  . Not on file   Social History Narrative     PHYSICAL EXAM   BP 84/60 mmHg  Pulse 68  Ht 5\' 7"  (1.702 m)  Wt 216 lb 12 oz (98.317 kg)  BMI 33.94 kg/m2 Constitutional: He is oriented to person,  place, and time. He appears well-developed and well-nourished. No distress.  HENT: No nasal discharge.  Head: Normocephalic and atraumatic.  Eyes: Pupils are equal and round.  No discharge. Neck: Normal range of motion. Neck supple. No JVD present. No thyromegaly present.  Cardiovascular: Normal rate, regular rhythm, normal heart sounds. Exam reveals no gallop and no friction rub. No murmur heard.  Pulmonary/Chest: Effort normal and breath sounds normal. No stridor. No respiratory distress. He has no wheezes. He has no rales. He exhibits no tenderness.  Abdominal: Soft. Bowel sounds are normal. He exhibits no distension. There is no tenderness. There is no rebound and no guarding.  Musculoskeletal: Normal range of motion. He exhibits no edema and no tenderness.  Neurological: He is alert and oriented to person, place, and time. Coordination normal.  Skin: Skin is warm and dry. No rash noted. He is not diaphoretic. No erythema. No pallor.  Psychiatric: He has a normal mood and affect. His behavior is normal. Judgment and thought content normal.      ASSESSMENT AND PLAN

## 2014-06-19 NOTE — Assessment & Plan Note (Signed)
Continue treatment with Crestor. I will plan on doing fasting lipid and liver profile during his next visit.

## 2014-07-02 ENCOUNTER — Telehealth: Payer: Self-pay | Admitting: *Deleted

## 2014-07-02 NOTE — Telephone Encounter (Signed)
We do not have available dosage at this time will contact drug rep for samples.

## 2014-07-02 NOTE — Telephone Encounter (Signed)
Patient needs samples of Entresto 12.5 mg. Please call when ready.

## 2014-07-05 ENCOUNTER — Telehealth: Payer: Self-pay | Admitting: *Deleted

## 2014-07-05 NOTE — Telephone Encounter (Signed)
Request from Disability Determination Services , sent to [employer] on 07/05/14 . ° °

## 2014-07-23 ENCOUNTER — Telehealth: Payer: Self-pay

## 2014-07-23 NOTE — Telephone Encounter (Signed)
Request from Disability Determination Services , sent to [employer] on 07/24/2014 .  

## 2014-07-30 ENCOUNTER — Ambulatory Visit (INDEPENDENT_AMBULATORY_CARE_PROVIDER_SITE_OTHER): Payer: Self-pay | Admitting: Family Medicine

## 2014-07-30 ENCOUNTER — Encounter: Payer: Self-pay | Admitting: Family Medicine

## 2014-07-30 VITALS — BP 118/78 | HR 74 | Temp 97.7°F | Resp 18 | Ht 66.0 in | Wt 216.7 lb

## 2014-07-30 DIAGNOSIS — I10 Essential (primary) hypertension: Secondary | ICD-10-CM

## 2014-07-30 DIAGNOSIS — E785 Hyperlipidemia, unspecified: Secondary | ICD-10-CM

## 2014-07-30 DIAGNOSIS — E1165 Type 2 diabetes mellitus with hyperglycemia: Secondary | ICD-10-CM

## 2014-07-30 DIAGNOSIS — E538 Deficiency of other specified B group vitamins: Secondary | ICD-10-CM | POA: Insufficient documentation

## 2014-07-30 DIAGNOSIS — IMO0002 Reserved for concepts with insufficient information to code with codable children: Secondary | ICD-10-CM

## 2014-07-30 DIAGNOSIS — G2581 Restless legs syndrome: Secondary | ICD-10-CM | POA: Insufficient documentation

## 2014-07-30 DIAGNOSIS — E559 Vitamin D deficiency, unspecified: Secondary | ICD-10-CM

## 2014-07-30 DIAGNOSIS — N189 Chronic kidney disease, unspecified: Secondary | ICD-10-CM

## 2014-07-30 DIAGNOSIS — T783XXA Angioneurotic edema, initial encounter: Secondary | ICD-10-CM | POA: Insufficient documentation

## 2014-07-30 DIAGNOSIS — E1122 Type 2 diabetes mellitus with diabetic chronic kidney disease: Secondary | ICD-10-CM

## 2014-07-30 MED ORDER — LORATADINE 10 MG PO TABS: 10.0000 mg | ORAL_TABLET | Freq: Every day | ORAL | Status: DC

## 2014-07-30 MED ORDER — METFORMIN HCL 850 MG PO TABS: 850.0000 mg | ORAL_TABLET | Freq: Two times a day (BID) | ORAL | Status: DC

## 2014-07-30 MED ORDER — ROPINIROLE HCL 0.5 MG PO TABS: 0.5000 mg | ORAL_TABLET | Freq: Every day | ORAL | Status: DC

## 2014-07-30 MED ORDER — PIMECROLIMUS 1 % EX CREA: TOPICAL_CREAM | Freq: Two times a day (BID) | CUTANEOUS | Status: DC

## 2014-07-30 MED ORDER — DIPHENHYDRAMINE HCL 25 MG PO TABS: 25.0000 mg | ORAL_TABLET | Freq: Four times a day (QID) | ORAL | Status: DC | PRN

## 2014-07-30 NOTE — Patient Instructions (Signed)
Angioedema °Angioedema is a sudden swelling of tissues, often of the skin. It can occur on the face or genitals or in the abdomen or other body parts. The swelling usually develops over a short period and gets better in 24 to 48 hours. It often begins during the night and is found when the person wakes up. The person may also get red, itchy patches of skin (hives). Angioedema can be dangerous if it involves swelling of the air passages.  °Depending on the cause, episodes of angioedema may only happen once, come back in unpredictable patterns, or repeat for several years and then gradually fade away.  °CAUSES  °Angioedema can be caused by an allergic reaction to various triggers. It can also result from nonallergic causes, including reactions to drugs, immune system disorders, viral infections, or an abnormal gene that is passed to you from your parents (hereditary). For some people with angioedema, the cause is unknown.  °Some things that can trigger angioedema include:  °· Foods.   °· Medicines, such as ACE inhibitors, ARBs, nonsteroidal anti-inflammatory agents, or estrogen.   °· Latex.   °· Animal saliva.   °· Insect stings.   °· Dyes used in X-rays.   °· Mild injury.   °· Dental work. °· Surgery. °· Stress.   °· Sudden changes in temperature.   °· Exercise. °SIGNS AND SYMPTOMS  °· Swelling of the skin. °· Hives. If these are present, there is also intense itching. °· Redness in the affected area.   °· Pain in the affected area. °· Swollen lips or tongue. °· Breathing problems. This may happen if the air passages swell. °· Wheezing. °If internal organs are involved, there may be:  °· Nausea.   °· Abdominal pain.   °· Vomiting.   °· Difficulty swallowing.   °· Difficulty passing urine. °DIAGNOSIS  °· Your health care provider will examine the affected area and take a medical and family history. °· Various tests may be done to help determine the cause. Tests may include: °¨ Allergy skin tests to see if the problem  is an allergic reaction.   °¨ Blood tests to check for hereditary angioedema.   °¨ Tests to check for underlying diseases that could cause the condition.   °· A review of your medicines, including over-the-counter medicines, may be done. °TREATMENT  °Treatment will depend on the cause of the angioedema. Possible treatments include:  °· Removal of anything that triggered the condition (such as stopping certain medicines).   °· Medicines to treat symptoms or prevent attacks. Medicines given may include:   °¨ Antihistamines.   °¨ Epinephrine injection.   °¨ Steroids.   °· Hospitalization may be required for severe attacks. If the air passages are affected, it can be an emergency. Tubes may need to be placed to keep the airway open. °HOME CARE INSTRUCTIONS  °· Take all medicines as directed by your health care provider. °· If you were given medicines for emergency allergy treatment, always carry them with you. °· Wear a medical bracelet as directed by your health care provider.   °· Avoid known triggers. °SEEK MEDICAL CARE IF:  °· You have repeat attacks of angioedema.   °· Your attacks are more frequent or more severe despite preventive measures.   °· You have hereditary angioedema and are considering having children. It is important to discuss with your health care provider the risks of passing the condition on to your children. °SEEK IMMEDIATE MEDICAL CARE IF:  °· You have severe swelling of the mouth, tongue, or lips. °· You have difficulty breathing.   °· You have difficulty swallowing.   °· You faint. °MAKE   SURE YOU: °· Understand these instructions. °· Will watch your condition. °· Will get help right away if you are not doing well or get worse. °Document Released: 04/06/2001 Document Revised: 06/12/2013 Document Reviewed: 09/19/2012 °ExitCare® Patient Information ©2015 [employer]. This information is not intended to replace advice given to you by your health care provider. Make sure you discuss any questions  you have with your health care provider. ° °

## 2014-07-30 NOTE — Addendum Note (Signed)
Addended by: Alba Cory F on: 07/30/2014 04:26 PM   Modules accepted: Orders

## 2014-07-30 NOTE — Progress Notes (Signed)
Name: Angel Cruz   MRN: [id number]    DOB: 01-01-1956   Date:07/30/2014       Progress Note  Subjective  Chief Complaint  Chief Complaint  Patient presents with  . Poison Ivy    patient was weed eating in the back yard on Thursday. Both eyes has been effected but it is predominantly the right eye. Patient states his vision is blurry.  . Referral    patient has an appt on 09/20/2014 at 2pm with Dr. Kirke Corin the heart specialist    HPI  Facial swelling : he was weeding his yard last week and last night developed swelling, itching and redness around both eyes but worse on the right side. No blurred vision, it is a dry rash. He is taking Entresto that has an ARB as one of the components, no previous history of angioedema. No previous history of eczema or AR.  DM: he has been off insulin because of hypoglycemic episodes, however he has been more stressed lately. Approved for disability but only receiving Medicaid at this time. Frustrated about lack of income and glucose has been between 200's.  He denies polyphagia, polydipsia or polyuria.    RLS: he needs refills of his medications, having recurrent leg movement at night   Patient Active Problem List   Diagnosis Date Noted  . Angioedema 07/30/2014  . RLS (restless legs syndrome) 07/30/2014  . B12 deficiency 07/30/2014  . Vitamin D deficiency 07/30/2014  . Chronic systolic heart failure 11/14/2013  . Ischemic cardiomyopathy   . CAD (coronary artery disease)   . Hypertension   . Hyperlipidemia   . 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 Depression   . CKD (chronic kidney disease), stage III     Past Surgical History  Procedure Laterality Date  . Cardiac catheterization  10/19/2013    [facility]  . Uvulectomy  1995  . Atrial fibrillation ablation  1995  . Elbow surgery Left   . Rotator cuff repair    . Carpal tunnel release      Family History  Problem Relation Age of Onset  . Stroke Mother   . COPD Mother    . Heart failure Mother   . Heart attack Mother   . Colon cancer Father   . Diabetes Father   . Diabetes Brother   . Cancer Brother     History   Social History  . Marital Status: Married    Spouse Name: N/A  . Number of Children: N/A  . Years of Education: N/A   Occupational History  . Not on file.   Social History Main Topics  . Smoking status: Never Smoker   . Smokeless tobacco: Not on file  . Alcohol Use: No  . Drug Use: No  . Sexual Activity: No   Other Topics Concern  . Not on file   Social History Narrative     Current outpatient prescriptions:  .  aspirin 81 MG tablet, Take 81 mg by mouth daily., Disp: , Rfl:  .  carvedilol (COREG) 25 MG tablet, Take 0.5 tablets (12.5 mg total) by mouth 2 (two) times daily., Disp: 180 tablet, Rfl: 3 .  QUEtiapine (SEROQUEL) 100 MG tablet, Take 100 mg by mouth at bedtime., Disp: , Rfl:  .  rosuvastatin (CRESTOR) 20 MG tablet, Take 20 mg by mouth daily., Disp: , Rfl:  .  sacubitril-valsartan (ENTRESTO) 24-26 MG, Take 1 tablet by mouth 2 (two) times daily., Disp: 60 tablet, Rfl: 3 .  diphenhydrAMINE (BENADRYL) 25 MG tablet, Take 1 tablet (25 mg total) by mouth every 6 (six) hours as needed., Disp: 30 tablet, Rfl: 0 .  loratadine (CLARITIN) 10 MG tablet, Take 1 tablet (10 mg total) by mouth daily., Disp: 30 tablet, Rfl: 0 .  metFORMIN (GLUCOPHAGE) 850 MG tablet, Take 1 tablet (850 mg total) by mouth 2 (two) times daily with a meal., Disp: 60 tablet, Rfl: 0 .  nitroGLYCERIN (NITROSTAT) 0.4 MG SL tablet, Place 0.4 mg under the tongue every 5 (five) minutes as needed for chest pain., Disp: , Rfl:  .  pimecrolimus (ELIDEL) 1 % cream, Apply topically 2 (two) times daily. Around eye, Disp: 30 g, Rfl: 0 .  rOPINIRole (REQUIP) 0.5 MG tablet, Take 1 tablet (0.5 mg total) by mouth at bedtime., Disp: 30 tablet, Rfl: 5  No Known Allergies   ROS  Constitutional: Negative for fever or significant weight change.  Respiratory: Negative for  cough and shortness of breath.   Cardiovascular: Negative for  palpitations. Had a brief episode of chest pain last week, but resolved without medication Gastrointestinal: Negative for abdominal pain, no bowel changes.  Musculoskeletal: Negative for gait problem or joint swelling.  Skin: positive for rash on his face  Neurological: Negative for dizziness or headache.  No other specific complaints in a complete review of systems (except as listed in HPI above).  Objective  Filed Vitals:   07/30/14 1454  BP: 118/78  Pulse: 74  Temp: 97.7 F (36.5 C)  TempSrc: Oral  Resp: 18  Height:  (1.676 m)  Weight: 216 lb 11.2 oz (98.294 kg)  SpO2: 97%    Body mass index is 34.99 kg/(m^2).  Physical Exam   Constitutional: Patient appears well-developed and well-nourished. No distress.  HENT: Head: Normocephalic and atraumatic. Nose: Nose normal. Mouth/Throat: Oropharynx is clear and moist. No oropharyngeal exudate.  Eyes: Conjunctivae and EOM are normal. Pupils are equal, round, and reactive to light. No scleral icterus. He has swollen eye lid on the right side, some erythema around both eyes but worse on the right side. Neck: Normal range of motion. Neck supple. No JVD present. No thyromegaly present.  Cardiovascular: Normal rate, regular rhythm and normal heart sounds.  No murmur heard. No BLE edema. Pulmonary/Chest: Effort normal and breath sounds normal. No respiratory distress. Abdominal: Soft. There is no tenderness. no masses Musculoskeletal: Normal range of motion, no joint effusions. No gross deformities Neurological: he is alert and oriented to person, place, and time. No cranial nerve deficit. Coordination, balance, strength, speech and gait are normal.  Skin: Skin is warm and dry. No rash noted. No erythema.  Psychiatric: Patient has a normal mood here, but at home wife states very depressed  PHQ2/9: Depression screen PHQ 2/9 07/30/2014  Decreased Interest 1  Down,  Depressed, Hopeless 1  PHQ - 2 Score 2  Altered sleeping 3  Tired, decreased energy 2  Change in appetite 2  Feeling bad or failure about yourself  1  Trouble concentrating 1  Moving slowly or fidgety/restless 2  Suicidal thoughts 0  PHQ-9 Score 13  Difficult doing work/chores Somewhat difficult     Fall Risk: Fall Risk  07/30/2014  Falls in the past year? No     Assessment & Plan  1. Angioedema, initial encounter Discussed it may be secondary to [facility] and to call back if no resolution within the next 24 hours  - loratadine (CLARITIN) 10 MG tablet; Take 1 tablet (10 mg total) by mouth  daily.  Dispense: 30 tablet; Refill: 0 - diphenhydrAMINE (BENADRYL) 25 MG tablet; Take 1 tablet (25 mg total) by mouth every 6 (six) hours as needed.  Dispense: 30 tablet; Refill: 0 - pimecrolimus (ELIDEL) 1 % cream; Apply topically 2 (two) times daily. Around eye  Dispense: 30 g; Refill: 0  2. Essential hypertension  - Comprehensive Metabolic Panel (CMET)  3. Hyperlipidemia  - Lipid Profile  4. 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  Dispense: 60 tablet; Refill: 0 - HgB A1c  5. RLS (restless legs syndrome)  - rOPINIRole (REQUIP) 0.5 MG tablet; Take 1 tablet (0.5 mg total) by mouth at bedtime.  Dispense: 30 tablet; Refill: 5  6.Vitamin B12 deficiency:  - Vitamin B12  7. Vitamin D deficiency  - Vitamin D (25 hydroxy)

## 2014-07-31 ENCOUNTER — Other Ambulatory Visit
Admission: RE | Admit: 2014-07-31 | Discharge: 2014-07-31 | Disposition: A | Discharge status: Home / Self Care | Payer: Medicaid Other | Source: Ambulatory Visit | Attending: Family Medicine | Admitting: Family Medicine

## 2014-07-31 DIAGNOSIS — E1122 Type 2 diabetes mellitus with diabetic chronic kidney disease: Secondary | ICD-10-CM | POA: Insufficient documentation

## 2014-07-31 DIAGNOSIS — E46 Unspecified protein-calorie malnutrition: Secondary | ICD-10-CM | POA: Insufficient documentation

## 2014-07-31 DIAGNOSIS — I10 Essential (primary) hypertension: Secondary | ICD-10-CM | POA: Diagnosis not present

## 2014-07-31 DIAGNOSIS — L309 Dermatitis, unspecified: Secondary | ICD-10-CM | POA: Insufficient documentation

## 2014-07-31 LAB — COMPREHENSIVE METABOLIC PANEL
ALT: 22 U/L (ref 17–63)
AST: 18 U/L (ref 15–41)
Albumin: 3.6 g/dL (ref 3.5–5.0)
Alkaline Phosphatase: 63 U/L (ref 38–126)
Anion gap: 3 — ABNORMAL LOW (ref 5–15)
BUN: 42 mg/dL — ABNORMAL HIGH (ref 6–20)
CO2: 25 mmol/L (ref 22–32)
CREATININE: 1.73 mg/dL — AB (ref 0.61–1.24)
Calcium: 9.3 mg/dL (ref 8.9–10.3)
Chloride: 108 mmol/L (ref 101–111)
GFR, EST AFRICAN AMERICAN: 49 mL/min — AB (ref >60)
GFR, EST NON AFRICAN AMERICAN: 42 mL/min — AB (ref >60)
GLUCOSE: 219 mg/dL — AB (ref 65–99)
Potassium: 4.2 mmol/L (ref 3.5–5.1)
Sodium: 136 mmol/L (ref 135–145)
Total Bilirubin: 0.6 mg/dL (ref 0.3–1.2)
Total Protein: 6.3 g/dL — ABNORMAL LOW (ref 6.5–8.1)

## 2014-07-31 LAB — LIPID PANEL
CHOL/HDL RATIO: 6.4 ratio
Cholesterol: 153 mg/dL (ref 0–200)
HDL: 24 mg/dL — AB (ref >40)
LDL CALC: 60 mg/dL (ref 0–99)
TRIGLYCERIDES: 346 mg/dL — AB (ref <150)
VLDL: 69 mg/dL — ABNORMAL HIGH (ref 0–40)

## 2014-07-31 LAB — VITAMIN B12: VITAMIN B 12: 677 pg/mL (ref 180–914)

## 2014-07-31 LAB — HEMOGLOBIN A1C: HEMOGLOBIN A1C: 10.7 % — AB (ref 4.0–6.0)

## 2014-08-01 LAB — VITAMIN D 25 HYDROXY (VIT D DEFICIENCY, FRACTURES): VIT D 25 HYDROXY: 23.3 ng/mL — AB (ref 30.0–100.0)

## 2014-08-02 ENCOUNTER — Telehealth: Payer: Self-pay | Admitting: Family Medicine

## 2014-08-02 DIAGNOSIS — H5711 Ocular pain, right eye: Secondary | ICD-10-CM

## 2014-08-02 NOTE — Telephone Encounter (Signed)
Pt right eye is not better. Patient would like to know if you would like to call in something different or if he need to come in to be seen. The eye was matted together (had to pull it apart), the eye started running yesterday, feels like something is in it.

## 2014-08-03 ENCOUNTER — Telehealth: Payer: Self-pay

## 2014-08-03 NOTE — Telephone Encounter (Signed)
[facility] and they were willing to work him in today, talked to pt's mom they are not in town and will just wait to see Dr. Carlynn Purl on Monday. Will call [facility] and let them know to disregard request for appt today.

## 2014-08-03 NOTE — Telephone Encounter (Signed)
Called patient, had to leave a message, per Dr. Carlynn Purl pt needs to been seen at an Urgent Care or ER to have his eye examined to make sure he doesn't have a corneal abrasion since he can't been seen at the [facility] doctor.

## 2014-08-03 NOTE — Telephone Encounter (Signed)
Needs to see ophthalmologist.  

## 2014-08-03 NOTE — Telephone Encounter (Signed)
Would you like this patient to come back in?

## 2014-08-06 ENCOUNTER — Encounter: Payer: Self-pay | Admitting: Family Medicine

## 2014-08-06 ENCOUNTER — Ambulatory Visit (INDEPENDENT_AMBULATORY_CARE_PROVIDER_SITE_OTHER): Payer: Self-pay | Admitting: Family Medicine

## 2014-08-06 VITALS — BP 124/72 | HR 76 | Temp 98.3°F | Resp 17 | Ht 66.0 in | Wt 221.3 lb

## 2014-08-06 DIAGNOSIS — L309 Dermatitis, unspecified: Secondary | ICD-10-CM

## 2014-08-06 DIAGNOSIS — F32A Depression, unspecified: Secondary | ICD-10-CM

## 2014-08-06 DIAGNOSIS — IMO0002 Reserved for concepts with insufficient information to code with codable children: Secondary | ICD-10-CM

## 2014-08-06 DIAGNOSIS — E1165 Type 2 diabetes mellitus with hyperglycemia: Secondary | ICD-10-CM

## 2014-08-06 DIAGNOSIS — Z1211 Encounter for screening for malignant neoplasm of colon: Secondary | ICD-10-CM

## 2014-08-06 DIAGNOSIS — E785 Hyperlipidemia, unspecified: Secondary | ICD-10-CM

## 2014-08-06 DIAGNOSIS — F329 Major depressive disorder, single episode, unspecified: Secondary | ICD-10-CM

## 2014-08-06 DIAGNOSIS — E46 Unspecified protein-calorie malnutrition: Secondary | ICD-10-CM

## 2014-08-06 DIAGNOSIS — E1122 Type 2 diabetes mellitus with diabetic chronic kidney disease: Secondary | ICD-10-CM

## 2014-08-06 DIAGNOSIS — N189 Chronic kidney disease, unspecified: Secondary | ICD-10-CM

## 2014-08-06 DIAGNOSIS — I10 Essential (primary) hypertension: Secondary | ICD-10-CM

## 2014-08-06 MED ORDER — INSULIN DEGLUDEC 200 UNIT/ML ~~LOC~~ SOPN: 10.0000 [IU] | PEN_INJECTOR | Freq: Every day | SUBCUTANEOUS | Status: DC

## 2014-08-06 MED ORDER — ESCITALOPRAM OXALATE 10 MG PO TABS: 10.0000 mg | ORAL_TABLET | Freq: Every day | ORAL | Status: DC

## 2014-08-06 NOTE — Patient Instructions (Signed)
Malnutrition Many of us think of malnutrition as a condition in which there is not enough to eat. Malnutrition is actually any condition where nutrition is poor. This means:  Too much to eat as we see in conditions of obesity.  Too little to eat with starvation. The following information is only for the malnourished with dietary deficiencies (poor diet). CAUSES  Under-nutrition can result from:  Poor intake.  Malabsorption  Lactation  Bleeding  Diarrhea  Old age.  Kidney failure.  Infancy  Poverty  Infection  Adolescence  Excessive sweating  Drug addiction  Pregnancy  Early childhood. Under-nutrition comes anytime the demand is more than the intake. SYMPTOMS  The problems depend on what type of malnutrition is present. Some general symptoms include:  Fatigue.  Dizziness.  Fainting  Weight loss.  Poor immune response.  Lack of menstruation.  Lack of growth in children.  Hair loss. DIAGNOSIS  Your caregiver will usually suspect malnutrition based on results of your:   Medical and dietary history.  Physical exam. This will often include measurements of your BMI (body mass index).  Perhaps some blood tests. These may include: plasma levels of nutrients and nutrient-dependent substances, such as:  Hemoglobin  Thyroid hormones  Transferrin  Albumin RISK FACTORS Persons in the following circumstances may be at risk of malnutrition.  Infants and children are at risk of under-nutrition. This is because of their high demand for energy and essential nutrients. Protein-energy malnutrition in children consuming inadequate amounts of protein, calories, and other nutrients is a particularly severe form of under-nutrition that delays growth and development. This includes [place] and [place].  Hemorrhagic disease of the newborn is a life-threatening disorder. This is due to a lack of vitamin K, iron, folic acid, vitamin C, copper, zinc, and vitamin  A. This may occur in inadequately fed infants and children.  In adolescence, nutritional requirements increase because they are growing. Anorexia nervosa, a form of starvation, may affect adolescents.  Pregnancy and lactation. Requirements for all nutrients are increased during pregnancy and lactation.  Abnormal diets, such as pica (the consumption of nonnutritive substances, such as clay and charcoal), are common in pregnancy.  Anemia due to folic acid deficiency is common in pregnant women. This is especially true for those who have taken oral contraceptives. Folic acid supplements are now recommended for pregnant women. Folic acid prevents neural tube defects (spina bifida) in children.  Breast-fed-only infants may develop vitamin B12 deficiency if the mother is a vegan.  An alcoholic mother may have a handicapped and stunted child with fetal alcohol syndrome. This is due to the effects of alcohol on the fetus. Do not drink during pregnancy.  Old age: A weakened sense of taste and smell, loneliness, physical and mental handicaps, immobility, and chronic illness can hurt the food intake in the elderly. Absorption is reduced. This may add to iron deficiency, calcium and bone problems and also a softening of the bones due to lack of vitamin D. This is also made worse by not being in the sun.  With aging, we loose lean body mass. These changes and a reduction in physical activity result in lower energy and protein requirements compared with those of younger adults.  Chronic disease including malabsorption states (including those resulting from surgery) tend to impair the absorption of fat-soluble vitamins, vitamin B12, calcium, and iron.  Liver disease impairs the storage of vitamins A and B12. It also interferes with the metabolism of protein and energy sources.  Kidney disease may   cause deficiencies of protein, iron, and vitamin D.  Cancer and AIDS may cause anorexia. This is a loss of  appetite.  Vegetarian diet. The most common form of this type of diet is when meat and fish are not eaten, but eggs and dairy products are eaten. Iron deficiency is the only risk. Ovo-lacto vegetarians tend to live longer and to develop fewer chronic disabling conditions than their meat-eating peers. However, their lifestyle usually includes regular exercise and abstention from alcohol and tobacco. This may contribute to better health. Vegans consume no animal products and are susceptible to vitamin B12 deficiency. Yeast extracts and oriental-style fermented foods provide this vitamin. Intake of calcium, iron, and zinc also tends to be low. A fruitarian diet (eat only fruit) is deficient in protein, salt, and many micronutrients. This is not recommended.  Fad diets: Many commercial diets are claimed to enhance well-being or reduce weight. A physician should be alert to early evidence of nutrient deficiency or toxicity in patients on these diets. Such diets have resulted in vitamin, mineral, and protein deficiency states and cardiac, renal, and metabolic disorders. Some fad diets have resulted in death. People on very low calorie diets (less than 400 kcal/day) cannot sustain health for long. Some trace mineral supplements have induced toxicity.  Alcohol or drug dependency: Addiction leads to a troubled lifestyle in which adequate nourishment is ignored. Absorption and metabolism of nutrients are impaired. High levels of alcohol are poisonous. Too much alcohol can cause tissue injury, particularly of the GI tract, liver, pancreas, brain, and peripheral nervous system. Beer drinkers who consume food may gain weight, but alcoholics who use more than one quart of hard liquor per day lose weight and become undernourished. Drug addicts are usually very skinny. Alcoholism is the most common cause of thiamine deficiency and may lead to deficiencies of magnesium, zinc, and other vitamins. TREATMENT  Get treatment if  you experience changes in how your body is working.  PREVENTION  Eating a good, well-balanced diet helps to prevent most forms of malnutrition. Document Released: 12/12/2004 Document Revised: 04/20/2011 Document Reviewed: 01/03/2007 Exit[employer] Patient Information 2015 [employer]. This information is not intended to replace advice given to you by your health care provider. Make sure you discuss any questions you have with your health care provider.  

## 2014-08-06 NOTE — Progress Notes (Signed)
Name: Angel Cruz   MRN: [id number]    DOB: 1955-03-14   Date:08/06/2014       Progress Note  Subjective  Chief Complaint  Chief Complaint  Patient presents with  . Follow-up    1 week  . Diabetes    A1C- 10.7% at last visit-07/30/2014    HPI  DMII: last hgbA1C was 10.7%, he was started on Metformin, but explained that because glucose toxicity we need to resume a long acting insulin.  He had vomiting with Lantus in the past, we will try [place]. Denies blurred vision, no polydipsia or polyuria. 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  Depression: wife is concerned because since he has stopped working due to disability he has been sleeping a lot, no energy. Denies crying spells. Patient states he does not feel sad, but is frustrated, but he agrees with the lack of energy/motivation  Protein-calorie malnutrition: total protein is low, diet is low in protein. Skips meals.    Patient Active Problem List   Diagnosis Date Noted  . Angioedema 07/30/2014  . RLS (restless legs syndrome) 07/30/2014  . B12 deficiency 07/30/2014  . Vitamin D deficiency 07/30/2014  . Chronic systolic heart failure 04/30/2246  . Ischemic cardiomyopathy   . CAD (coronary artery disease)   . Hypertension   . Hyperlipidemia   . 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 Depression   . CKD (chronic kidney disease), stage III     Past Surgical History  Procedure Laterality Date  . Cardiac catheterization  10/19/2013    [facility]  . Uvulectomy  1995  . Atrial fibrillation ablation  1995  . Elbow surgery Left   . Rotator cuff repair    . Carpal tunnel release      Family History  Problem Relation Age of  Onset  . Stroke Mother   . COPD Mother   . Heart failure Mother   . Heart attack Mother   . Colon cancer Father   . Diabetes Father   . Diabetes Brother   . Cancer Brother     History   Social History  . Marital Status: Married    Spouse Name: [name]  . Number of Children: 2  . Years of Education: N/A   Occupational History  . Not on file.   Social History Main Topics  . Smoking status: Never Smoker   . Smokeless tobacco: Not on file  . Alcohol Use: No  . Drug Use: No  . Sexual Activity: No   Other Topics Concern  . Not on file   Social History Narrative     Current outpatient prescriptions:  .  aspirin 81 MG tablet, Take 81 mg by mouth daily., Disp: , Rfl:  .  carvedilol (COREG) 25 MG tablet, Take 0.5 tablets (12.5 mg total) by mouth 2 (two) times daily., Disp: 180 tablet, Rfl: 3 .  diphenhydrAMINE (BENADRYL) 25 MG tablet, Take 1 tablet (25 mg total) by mouth every 6 (six) hours as needed., Disp: 30 tablet, Rfl: 0 .  loratadine (CLARITIN) 10 MG tablet, Take 1 tablet (10 mg total) by mouth daily., Disp: 30 tablet, Rfl: 0 .  metFORMIN (GLUCOPHAGE) 850 MG tablet, Take 1 tablet (  850 mg total) by mouth 2 (two) times daily with a meal., Disp: 60 tablet, Rfl: 0 .  nitroGLYCERIN (NITROSTAT) 0.4 MG SL tablet, Place 0.4 mg under the tongue every 5 (five) minutes as needed for chest pain., Disp: , Rfl:  .  pimecrolimus (ELIDEL) 1 % cream, Apply topically 2 (two) times daily. Around eye, Disp: 30 g, Rfl: 0 .  QUEtiapine (SEROQUEL) 100 MG tablet, Take 100 mg by mouth at bedtime., Disp: , Rfl:  .  rOPINIRole (REQUIP) 0.5 MG tablet, Take 1 tablet (0.5 mg total) by mouth at bedtime., Disp: 30 tablet, Rfl: 5 .  rosuvastatin (CRESTOR) 20 MG tablet, Take 20 mg by mouth daily., Disp: , Rfl:  .  sacubitril-valsartan (ENTRESTO) 24-26 MG, Take 1 tablet by mouth 2 (two) times daily., Disp: 60 tablet, Rfl: 3 .  Insulin Degludec (TRESIBA FLEXTOUCH) 200 UNIT/ML SOPN, Inject 10 Units into the  skin daily., Disp: 5 pen, Rfl: 2  No Known Allergies   ROS  Constitutional: Negative for fever or weight change.  Respiratory: Negative for cough and shortness of breath.   Cardiovascular: Negative for chest pain or palpitations.  Gastrointestinal: Negative for abdominal pain, no bowel changes.  Musculoskeletal: Negative for gait problem or joint swelling.  Skin: Negative for rash - resolved on face Neurological: Negative for dizziness or headache.  No other specific complaints in a complete review of systems (except as listed in HPI above).   Objective  Filed Vitals:   08/06/14 1151  BP: 124/72  Pulse: 76  Temp: 98.3 F (36.8 C)  TempSrc: Oral  Resp: 17  Height: $Remove'5\' 6"'HvlavGL$  (1.676 m)  Weight: 221 lb 4.8 oz (100.381 kg)  SpO2: 97%    Body mass index is 35.74 kg/(m^2).  Physical Exam  Constitutional: Patient appears well-developed and well-nourished. No distress.  Eyes:  No scleral icterus.  Neck: Normal range of motion. Neck supple. Cardiovascular: Normal rate, regular rhythm and normal heart sounds.  No murmur heard. No BLE edema. Pulmonary/Chest: Effort normal and breath sounds normal. No respiratory distress. Abdominal: Soft.  There is no tenderness. Psychiatric: Patient has a normal mood and affect. behavior is normal. Judgment and thought content normal.  Recent Results (from the past 2160 hour(s))  Lipid panel     Status: Abnormal   Collection Time: 07/31/14  9:38 AM  Result Value Ref Range   Cholesterol 153 0 - 200 mg/dL   Triglycerides 346 (H) <150 mg/dL   HDL 24 (L) >40 mg/dL   Total CHOL/HDL Ratio 6.4 RATIO   VLDL 69 (H) 0 - 40 mg/dL   LDL Cholesterol 60 0 - 99 mg/dL    Comment:        Total Cholesterol/HDL:CHD Risk Coronary Heart Disease Risk Table                     Men   Women  1/2 Average Risk   3.4   3.3  Average Risk       5.0   4.4  2 X Average Risk   9.6   7.1  3 X Average Risk  23.4   11.0        Use the calculated Patient Ratio above and  the CHD Risk Table to determine the patient's CHD Risk.        ATP III CLASSIFICATION (LDL):  <100     mg/dL   Optimal  100-129  mg/dL   Near or Above  Optimal  130-159  mg/dL   Borderline  160-189  mg/dL   High  >190     mg/dL   Very High   Comprehensive metabolic panel     Status: Abnormal   Collection Time: 07/31/14  9:38 AM  Result Value Ref Range   Sodium 136 135 - 145 mmol/L   Potassium 4.2 3.5 - 5.1 mmol/L   Chloride 108 101 - 111 mmol/L   CO2 25 22 - 32 mmol/L   Glucose, Bld 219 (H) 65 - 99 mg/dL   BUN 42 (H) 6 - 20 mg/dL   Creatinine, Ser 1.73 (H) 0.61 - 1.24 mg/dL   Calcium 9.3 8.9 - 10.3 mg/dL   Total Protein 6.3 (L) 6.5 - 8.1 g/dL   Albumin 3.6 3.5 - 5.0 g/dL   AST 18 15 - 41 U/L   ALT 22 17 - 63 U/L   Alkaline Phosphatase 63 38 - 126 U/L   Total Bilirubin 0.6 0.3 - 1.2 mg/dL   GFR calc non Af Amer 42 (L) >60 mL/min   GFR calc Af Amer 49 (L) >60 mL/min    Comment: (NOTE) The eGFR has been calculated using the CKD EPI equation. This calculation has not been validated in all clinical situations. eGFR's persistently <60 mL/min signify possible Chronic Kidney Disease.    Anion gap 3 (L) 5 - 15  Vit D  25 hydroxy (rtn osteoporosis monitoring)     Status: Abnormal   Collection Time: 07/31/14  9:38 AM  Result Value Ref Range   Vit D, 25-Hydroxy 23.3 (L) 30.0 - 100.0 ng/mL    Comment: (NOTE) Vitamin D deficiency has been defined by the [place] practice guideline as a level of serum 25-OH vitamin D less than 20 ng/mL (1,2). The Endocrine Society went on to further define vitamin D insufficiency as a level between 21 and 29 ng/mL (2). 1. IOM (Institute of Medicine). 2010. Dietary reference   intakes for calcium and D. [place]: The   [employer]. 2. Holick MF, Binkley , Bischoff-Ferrari HA, et al.   Evaluation, treatment, and prevention of vitamin D   deficiency: an Endocrine Society  clinical practice   guideline. JCEM. 2011 Jul; 96(7):1911-30. Performed At: [facility] [place], [place] [id number] Lindon Romp MD RA:[id number]   Hemoglobin A1c     Status: Abnormal   Collection Time: 07/31/14  9:38 AM  Result Value Ref Range   Hgb A1c MFr Bld 10.7 (H) 4.0 - 6.0 %  Vitamin B12     Status: None   Collection Time: 07/31/14  9:38 AM  Result Value Ref Range   Vitamin B-12 677 180 - 914 pg/mL    Comment: (NOTE) This assay is not validated for testing neonatal or myeloproliferative syndrome specimens for Vitamin B12 levels. Performed at [facility]       PHQ2/9: Depression screen [facility] 2/9 07/30/2014  Decreased Interest 1  Down, Depressed, Hopeless 1  PHQ - 2 Score 2  Altered sleeping 3  Tired, decreased energy 2  Change in appetite 2  Feeling bad or failure about yourself  1  Trouble concentrating 1  Moving slowly or fidgety/restless 2  Suicidal thoughts 0  PHQ-9 Score 13  Difficult doing work/chores Somewhat difficult     Fall Risk: Fall Risk  07/30/2014  Falls in the past year? No     Assessment & Plan  1. Uncontrolled diabetes mellitus with chronic  kidney disease Add [place], patient states he has diabetic retinopathy and needs a referral, asked him to request release medical records from [facility].   - Insulin Degludec (TRESIBA FLEXTOUCH) 200 UNIT/ML SOPN; Inject 10 Units into the skin daily.  Dispense: 5 pen; Refill: 2  2. Hyperlipidemia Lipid panel shows low HDL : to improve HDL patient  needs to eat tree nuts ( pecans/pistachios/almonds ) four times weekly, eat fish two times weekly  and exercise  at least 150 minutes per week  3. Essential hypertension At goal   4. B12 deficiency Continue supplementation  5. Eczema of face Doing well now  6. Depression Adjusting on being home , disability.   Started on Lexapro $RemoveBe'10mg'KVWWoZcaY$  -   7. Colon cancer screening  - Ambulatory referral to  Gastroenterology .dia  8. Protein-Calorie Malnutrition:   Needs to increase protein intake

## 2014-08-09 ENCOUNTER — Ambulatory Visit: Payer: Medicaid Other | Admitting: Family Medicine

## 2014-08-15 ENCOUNTER — Encounter: Payer: Self-pay | Admitting: Emergency Medicine

## 2014-08-15 ENCOUNTER — Emergency Department: Payer: Medicaid Other

## 2014-08-15 ENCOUNTER — Emergency Department
Admission: EM | Admit: 2014-08-15 | Discharge: 2014-08-15 | Disposition: A | Discharge status: Home / Self Care | Payer: Medicaid Other | Attending: Emergency Medicine | Admitting: Emergency Medicine

## 2014-08-15 ENCOUNTER — Other Ambulatory Visit: Payer: Self-pay

## 2014-08-15 DIAGNOSIS — I509 Heart failure, unspecified: Secondary | ICD-10-CM | POA: Insufficient documentation

## 2014-08-15 DIAGNOSIS — I959 Hypotension, unspecified: Secondary | ICD-10-CM | POA: Diagnosis not present

## 2014-08-15 DIAGNOSIS — I129 Hypertensive chronic kidney disease with stage 1 through stage 4 chronic kidney disease, or unspecified chronic kidney disease: Secondary | ICD-10-CM | POA: Diagnosis not present

## 2014-08-15 DIAGNOSIS — N183 Chronic kidney disease, stage 3 (moderate): Secondary | ICD-10-CM | POA: Insufficient documentation

## 2014-08-15 DIAGNOSIS — Z79899 Other long term (current) drug therapy: Secondary | ICD-10-CM | POA: Insufficient documentation

## 2014-08-15 DIAGNOSIS — I2510 Atherosclerotic heart disease of native coronary artery without angina pectoris: Secondary | ICD-10-CM | POA: Insufficient documentation

## 2014-08-15 DIAGNOSIS — R079 Chest pain, unspecified: Secondary | ICD-10-CM | POA: Insufficient documentation

## 2014-08-15 DIAGNOSIS — E1122 Type 2 diabetes mellitus with diabetic chronic kidney disease: Secondary | ICD-10-CM | POA: Diagnosis not present

## 2014-08-15 DIAGNOSIS — Z794 Long term (current) use of insulin: Secondary | ICD-10-CM | POA: Insufficient documentation

## 2014-08-15 DIAGNOSIS — Z7982 Long term (current) use of aspirin: Secondary | ICD-10-CM | POA: Insufficient documentation

## 2014-08-15 LAB — CBC WITH DIFFERENTIAL/PLATELET
BASOS ABS: 0.1 10*3/uL (ref 0–0.1)
BASOS PCT: 1 %
EOS ABS: 0.2 10*3/uL (ref 0–0.7)
EOS PCT: 4 %
HCT: 36.6 % — ABNORMAL LOW (ref 40.0–52.0)
Hemoglobin: 12.2 g/dL — ABNORMAL LOW (ref 13.0–18.0)
LYMPHS PCT: 30 %
Lymphs Abs: 1.6 10*3/uL (ref 1.0–3.6)
MCH: 29.3 pg (ref 26.0–34.0)
MCHC: 33.3 g/dL (ref 32.0–36.0)
MCV: 88.1 fL (ref 80.0–100.0)
MONO ABS: 0.3 10*3/uL (ref 0.2–1.0)
Monocytes Relative: 5 %
Neutro Abs: 3.3 10*3/uL (ref 1.4–6.5)
Neutrophils Relative %: 60 %
Platelets: 170 10*3/uL (ref 150–440)
RBC: 4.16 MIL/uL — ABNORMAL LOW (ref 4.40–5.90)
RDW: 14.4 % (ref 11.5–14.5)
WBC: 5.5 10*3/uL (ref 3.8–10.6)

## 2014-08-15 LAB — COMPREHENSIVE METABOLIC PANEL
ALK PHOS: 51 U/L (ref 38–126)
ALT: 14 U/L — ABNORMAL LOW (ref 17–63)
AST: 18 U/L (ref 15–41)
Albumin: 4 g/dL (ref 3.5–5.0)
Anion gap: 6 (ref 5–15)
BILIRUBIN TOTAL: 0.3 mg/dL (ref 0.3–1.2)
BUN: 40 mg/dL — AB (ref 6–20)
CO2: 23 mmol/L (ref 22–32)
CREATININE: 1.39 mg/dL — AB (ref 0.61–1.24)
Calcium: 9 mg/dL (ref 8.9–10.3)
Chloride: 107 mmol/L (ref 101–111)
GFR calc Af Amer: >60 mL/min (ref >60)
GFR calc non Af Amer: 55 mL/min — ABNORMAL LOW (ref >60)
Glucose, Bld: 159 mg/dL — ABNORMAL HIGH (ref 65–99)
Potassium: 4.9 mmol/L (ref 3.5–5.1)
Sodium: 136 mmol/L (ref 135–145)
Total Protein: 7 g/dL (ref 6.5–8.1)

## 2014-08-15 LAB — TROPONIN I

## 2014-08-15 NOTE — ED Provider Notes (Signed)
[facility] Emergency Department Provider Note  ____________________________________________  Time seen: Approximately 10 PM  I have reviewed the triage vital signs and the nursing notes.   HISTORY  Chief Complaint Hypotension    HPI Angel Cruz is a 59 y.o. male with a history of CAD, CHF and hypertension presents with hypotension over the last 2 days. The patient says that he has had several new diabetes medications but has not had any change in his cardiac, or blood pressure medications. He says that he has been having some chest pain lasting for about 2-3 minutes over the past 2 days it is nonexertional. He says it is midsternal and nonradiating. It is not associated with any shortness of breath. Denies any exertional shortness of breath. Says that the chest pain is sharp. Denies any nausea or vomiting. Is a patient of Dr. Kary Kos.Denies any chest pain, shortness of breath at this time. His that he has been having some lightheadedness and headache with exertion. This is resolved with sitting. Denies any diarrhea.   Past Medical History  Diagnosis Date  . Diabetes mellitus without complication   . Hypertension   . Hyperlipidemia   . Depression   . Rotator cuff tear   . CKD (chronic kidney disease), stage III   . Ischemic cardiomyopathy     a. 10/2013 Echo: EF 35-40%, severe distal anteroseptal, anterior, and apical HK. Diast dysfxn, mild conc LVH, mildly dil LA, mild Ao sclerosis w/o stenosis.  Marland Kitchen CAD (coronary artery disease)     a. 10/2013 Lexi MV: EF 31%, apical, septal, ant-apical, inf-apical, lat-apical scar, septal and apical peri-infarct ischemia;  b. 10/2013 Cath: LM nl, LAD 20ost, 170m (faint R->L collats), D1 80p, LCX min irregs, OM1 min irregs, OM2 nl, OM3 30p, RCA 20p, PDA 60, RPL min irregs-->Med Rx.  . Morbid obesity   . CHF (congestive heart failure)   . Depression   . OSA (obstructive sleep apnea)   . Sleep apnea     recent test confirmed  diagnosis, was performed at the sleep center across the street    Patient Active Problem List   Diagnosis Date Noted  . Angioedema 07/30/2014  . RLS (restless legs syndrome) 07/30/2014  . B12 deficiency 07/30/2014  . Vitamin D deficiency 07/30/2014  . Chronic systolic heart failure 11/14/2013  . Ischemic cardiomyopathy   . CAD (coronary artery disease)   . Hypertension   . Hyperlipidemia   . 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 Depression   . CKD (chronic kidney disease), stage III     Past Surgical History  Procedure Laterality Date  . Cardiac catheterization  10/19/2013    [facility]  . Uvulectomy  1995  . Atrial fibrillation ablation  1995  . Elbow surgery Left   . Rotator cuff repair    . Carpal tunnel release      Current Outpatient Rx  Name  Route  Sig  Dispense  Refill  . aspirin 81 MG tablet   Oral   Take 81 mg by mouth daily.         . carvedilol (COREG) 25 MG tablet   Oral   Take 0.5 tablets (12.5 mg total) by mouth 2 (two) times daily.   180 tablet   3   . diphenhydrAMINE (BENADRYL) 25 MG tablet   Oral   Take 1 tablet (25 mg total) by mouth every 6 (six) hours as needed.   30 tablet   0   .  escitalopram (LEXAPRO) 10 MG tablet   Oral   Take 1 tablet (10 mg total) by mouth daily.   30 tablet   1   . Insulin Degludec (TRESIBA FLEXTOUCH) 200 UNIT/ML SOPN   Subcutaneous   Inject 10 Units into the skin daily.   5 pen   2   . loratadine (CLARITIN) 10 MG tablet   Oral   Take 1 tablet (10 mg total) by mouth daily.   30 tablet   0   . metFORMIN (GLUCOPHAGE) 850 MG tablet   Oral   Take 1 tablet (850 mg total) by mouth 2 (two) times daily with a meal.   60 tablet   0   . nitroGLYCERIN (NITROSTAT) 0.4 MG SL tablet   Sublingual   Place 0.4 mg under the tongue every 5 (five) minutes as needed for chest pain.         . pimecrolimus (ELIDEL) 1 % cream   Topical   Apply topically 2 (two) times daily. Around eye   30 g    0   . QUEtiapine (SEROQUEL) 100 MG tablet   Oral   Take 100 mg by mouth at bedtime.         Marland Kitchen rOPINIRole (REQUIP) 0.5 MG tablet   Oral   Take 1 tablet (0.5 mg total) by mouth at bedtime.   30 tablet   5   . rosuvastatin (CRESTOR) 20 MG tablet   Oral   Take 20 mg by mouth daily.         . sacubitril-valsartan (ENTRESTO) 24-26 MG   Oral   Take 1 tablet by mouth 2 (two) times daily.   60 tablet   3     Allergies Review of patient's allergies indicates no known allergies.  Family History  Problem Relation Age of Onset  . Stroke Mother   . COPD Mother   . Heart failure Mother   . Heart attack Mother   . Colon cancer Father   . Diabetes Father   . Diabetes Brother   . Cancer Brother     Social History History  Substance Use Topics  . Smoking status: Never Smoker   . Smokeless tobacco: Not on file  . Alcohol Use: No    Review of Systems Constitutional: No fever/chills Eyes: No visual changes. ENT: No sore throat. Cardiovascular: As above  Respiratory: Denies shortness of breath. Gastrointestinal: No abdominal pain.  No nausea, no vomiting.  No diarrhea.  No constipation. Genitourinary: Negative for dysuria. Musculoskeletal: Negative for back pain. Skin: Negative for rash. Neurological: Negative for headaches, focal weakness or numbness.  10-point ROS otherwise negative.  ____________________________________________   PHYSICAL EXAM:  VITAL SIGNS: ED Triage Vitals  Enc Vitals Group     BP 08/15/14 1937 95/65 mmHg     Pulse Rate 08/15/14 1937 69     Resp 08/15/14 1937 18     Temp 08/15/14 1937 98.3 F (36.8 C)     Temp Source 08/15/14 1937 Oral     SpO2 08/15/14 1934 99 %     Weight 08/15/14 1937 216 lb (97.977 kg)     Height 08/15/14 1937  (1.702 m)     Head Cir --      Peak Flow --      Pain Score --      Pain Loc --      Pain Edu? --      Excl. in GC? --     Constitutional: Alert  and oriented. Well appearing and in no acute  distress. Eyes: Conjunctivae are normal. PERRL. EOMI. Head: Atraumatic. Nose: No congestion/rhinnorhea. Mouth/Throat: Mucous membranes are moist.  Oropharynx non-erythematous. Neck: No stridor.   Cardiovascular: Normal rate, regular rhythm. Grossly normal heart sounds.  Good peripheral circulation. Respiratory: Normal respiratory effort.  No retractions. Lungs CTAB. Gastrointestinal: Soft and nontender. No distention. No abdominal bruits. No CVA tenderness. Musculoskeletal: No lower extremity tenderness nor edema.  No joint effusions. Neurologic:  Normal speech and language. No gross focal neurologic deficits are appreciated. Speech is normal. No gait instability. Skin:  Skin is warm, dry and intact. No rash noted. Psychiatric: Mood and affect are normal. Speech and behavior are normal.  ____________________________________________   LABS (all labs ordered are listed, but only abnormal results are displayed)  Labs Reviewed  COMPREHENSIVE METABOLIC PANEL - Abnormal; Notable for the following:    Glucose, Bld 159 (*)    BUN 40 (*)    Creatinine, Ser 1.39 (*)    ALT 14 (*)    GFR calc non Af Amer 55 (*)    All other components within normal limits  CBC WITH DIFFERENTIAL/PLATELET - Abnormal; Notable for the following:    RBC 4.16 (*)    Hemoglobin 12.2 (*)    HCT 36.6 (*)    All other components within normal limits  TROPONIN I  URINALYSIS COMPLETEWITH MICROSCOPIC ([facility] ONLY)   ____________________________________________  EKG  ED ECG REPORT I, Arelia Longest, the attending physician, personally viewed and interpreted this ECG.   Date: 08/15/2014  EKG Time: 2228  Rate: 55  Rhythm: sinus bradycardia  Axis: Normal axis  Intervals:none  ST&T Change: No abnormal ST elevations or depressions. No abnormal T-wave inversions.  ____________________________________________  RADIOLOGY  Chest x-ray no acute disease. I personally reviewed these  images. ____________________________________________   PROCEDURES   ____________________________________________   INITIAL IMPRESSION / ASSESSMENT AND PLAN / ED COURSE  Pertinent labs & imaging results that were available during my care of the patient were reviewed by me and considered in my medical decision making (see chart for details).  ----------------------------------------- 10:58 PM on 08/15/2014 -----------------------------------------  Patient is resting comfortably at this time and continues to be asymptomatic. He has reassuring labs as well as images. His EKG is bradycardic but otherwise without any ischemic changes. I discussed the case with Dr. Sherlie Ban who is covering for Dr. Kary Kos and thinks the patient is appropriate for follow-up in the office. Dr. Sherlie Ban says that he will pass the patient's information along to the office staff that they will call him tomorrow for an urgent follow-up appointment. The patient's pressure has recovered dramatically and is now in the 150s over 90s. I do not want to make any changes to his medications at this time because of the lability of his pressures. I think it would be better suited in the office with his cardiologist. Chest pain is atypical and the patient has reassuring labs and EKG and imaging. ____________________________________________   FINAL CLINICAL IMPRESSION(S) / ED DIAGNOSES  Acute hypotension, resolved. Initial visit. Acute chest pain. Return visit.    Myrna Blazer, MD 08/15/14 2300

## 2014-08-15 NOTE — ED Notes (Signed)
Patient unable to void at this time

## 2014-08-15 NOTE — ED Notes (Signed)
Pt arrived to the ED for complaints of hypotension. Pt reports that he takes his BP regularly and for the last couple of days it has been lower than normal. Pt also reports being light headed and dizzy when going from sitting to standing. Pt denies fever. Pt is AOx4 in no apparent distress.

## 2014-08-15 NOTE — Discharge Instructions (Signed)

## 2014-08-16 ENCOUNTER — Encounter: Payer: Self-pay | Admitting: Cardiovascular Disease

## 2014-08-16 ENCOUNTER — Ambulatory Visit (INDEPENDENT_AMBULATORY_CARE_PROVIDER_SITE_OTHER): Payer: Medicaid Other | Admitting: Cardiovascular Disease

## 2014-08-16 ENCOUNTER — Telehealth: Payer: Self-pay | Admitting: Family Medicine

## 2014-08-16 VITALS — BP 104/70 | HR 64 | Ht 67.0 in | Wt 216.0 lb

## 2014-08-16 DIAGNOSIS — I2510 Atherosclerotic heart disease of native coronary artery without angina pectoris: Secondary | ICD-10-CM

## 2014-08-16 DIAGNOSIS — E785 Hyperlipidemia, unspecified: Secondary | ICD-10-CM

## 2014-08-16 DIAGNOSIS — I5022 Chronic systolic (congestive) heart failure: Secondary | ICD-10-CM

## 2014-08-16 DIAGNOSIS — I10 Essential (primary) hypertension: Secondary | ICD-10-CM | POA: Diagnosis not present

## 2014-08-16 MED ORDER — CARVEDILOL 6.25 MG PO TABS: 6.2500 mg | ORAL_TABLET | Freq: Two times a day (BID) | ORAL | Status: DC

## 2014-08-16 NOTE — Assessment & Plan Note (Signed)
He appears to be euvolemic but continues to have labile hypertension. He is mostly bothered by symptomatic hypotension. Thus, I decreased the dose of carvedilol to 6.25 mg twice daily. I instructed him to take an extra dose if his systolic blood pressure exceeds 150 mmHg.

## 2014-08-16 NOTE — Telephone Encounter (Signed)
Got patient in with his cardiologist Dr. Kirke Corin today @ 3:15

## 2014-08-16 NOTE — Assessment & Plan Note (Signed)
The patient has no convincing symptoms of angina. Continue medical therapy.

## 2014-08-16 NOTE — Telephone Encounter (Signed)
Pt went to ER last night and the doctor on call want Angel Cruz to see the heart doctor (Dr [facility]) today. His Blood Pressure was 90/62 then jumped to 150/99. The chest xray was clear. Patient has mediciad and therefore Angelique Blonder (wife) would like to know the steps as far as getting him in today to see her. Do he have to come in to see you for this referral please advise

## 2014-08-16 NOTE — Assessment & Plan Note (Signed)
Lab Results  Component Value Date   CHOL 153 07/31/2014   HDL 24* 07/31/2014   LDLCALC 60 07/31/2014   TRIG 346* 07/31/2014   CHOLHDL 6.4 07/31/2014   Continue treatment with rosuvastatin. LDL was at target. Triglyceride was elevated likely due to suboptimal control of diabetes.

## 2014-08-16 NOTE — Assessment & Plan Note (Signed)
I made changes as outlined above. 

## 2014-08-16 NOTE — Progress Notes (Signed)
HPI   59 year old male who is here today for a followup visit regarding coronary artery disease and chronic systolic heart failure. He was admitted to [facility] in September, 2015 with complaints of cough and chest pain.  He ruled out for myocardial infarction.  He underwent stress testing which revealed apical infarct with peri-infarct ischemia.  EF was 31%.  Echocardiogram showed EF 35-40% with anteroseptal, anterior, apical wall motion abnormalities. He underwent cardiac catheterization which revealed an occluded mid LAD with otherwise nonobstructive CAD.  There were faint right to left collaterals supplying the distal LAD distribution.  He had recurrent problems with labile hypertension as well as episodes of fluid overload alternating with volume depletion whenever he is on a diuretic.  He reports recent episodes of dizziness and low blood pressure. He denies chest pain or shortness of breath. He went to the emergency room yesterday and was noted to have a systolic blood pressure in the 90s. His basic workup was negative. Before discharge, his systolic blood pressure went up to 150.   No Known Allergies   Current Outpatient Prescriptions on File Prior to Visit  Medication Sig Dispense Refill  . aspirin 81 MG tablet Take 81 mg by mouth daily.    . carvedilol (COREG) 25 MG tablet Take 0.5 tablets (12.5 mg total) by mouth 2 (two) times daily. 180 tablet 3  . escitalopram (LEXAPRO) 10 MG tablet Take 1 tablet (10 mg total) by mouth daily. 30 tablet 1  . Insulin Degludec (TRESIBA FLEXTOUCH) 200 UNIT/ML SOPN Inject 10 Units into the skin daily. 5 pen 2  . metFORMIN (GLUCOPHAGE) 850 MG tablet Take 1 tablet (850 mg total) by mouth 2 (two) times daily with a meal. 60 tablet 0  . nitroGLYCERIN (NITROSTAT) 0.4 MG SL tablet Place 0.4 mg under the tongue every 5 (five) minutes as needed for chest pain.    Marland Kitchen QUEtiapine (SEROQUEL) 100 MG tablet Take 100 mg by mouth at bedtime.    Marland Kitchen  rOPINIRole (REQUIP) 0.5 MG tablet Take 1 tablet (0.5 mg total) by mouth at bedtime. 30 tablet 5  . rosuvastatin (CRESTOR) 20 MG tablet Take 20 mg by mouth daily.    . sacubitril-valsartan (ENTRESTO) 24-26 MG Take 1 tablet by mouth 2 (two) times daily. 60 tablet 3   No current facility-administered medications on file prior to visit.     Past Medical History  Diagnosis Date  . Diabetes mellitus without complication   . Hypertension   . Hyperlipidemia   . Depression   . Rotator cuff tear   . CKD (chronic kidney disease), stage III   . Ischemic cardiomyopathy     a. 10/2013 Echo: EF 35-40%, severe distal anteroseptal, anterior, and apical HK. Diast dysfxn, mild conc LVH, mildly dil LA, mild Ao sclerosis w/o stenosis.  Marland Kitchen CAD (coronary artery disease)     a. 10/2013 Lexi MV: EF 31%, apical, septal, ant-apical, inf-apical, lat-apical scar, septal and apical peri-infarct ischemia;  b. 10/2013 Cath: LM nl, LAD 20ost, 154m (faint R->L collats), D1 80p, LCX min irregs, OM1 min irregs, OM2 nl, OM3 30p, RCA 20p, PDA 60, RPL min irregs-->Med Rx.  . Morbid obesity   . CHF (congestive heart failure)   . Depression   . OSA (obstructive sleep apnea)   . Sleep apnea     recent test confirmed diagnosis, was performed at the sleep center across the street     Past Surgical History  Procedure Laterality Date  . Cardiac  catheterization  10/19/2013    [facility]  . Uvulectomy  1995  . Atrial fibrillation ablation  1995  . Elbow surgery Left   . Rotator cuff repair    . Carpal tunnel release       Family History  Problem Relation Age of Onset  . Stroke Mother   . COPD Mother   . Heart failure Mother   . Heart attack Mother   . Colon cancer Father   . Diabetes Father   . Diabetes Brother   . Cancer Brother      History   Social History  . Marital Status: Married    Spouse Name: Angelique Blonder  . Number of Children: 2  . Years of Education: N/A   Occupational History  . Not on file.   Social  History Main Topics  . Smoking status: Never Smoker   . Smokeless tobacco: Not on file  . Alcohol Use: No  . Drug Use: No  . Sexual Activity: No   Other Topics Concern  . Not on file   Social History Narrative     PHYSICAL EXAM   BP 104/70 mmHg  Pulse 64  Ht  (1.702 m)  Wt 216 lb (97.977 kg)  BMI 33.82 kg/m2 Constitutional: He is oriented to person, place, and time. He appears well-developed and well-nourished. No distress.  HENT: No nasal discharge.  Head: Normocephalic and atraumatic.  Eyes: Pupils are equal and round.  No discharge. Neck: Normal range of motion. Neck supple. No JVD present. No thyromegaly present.  Cardiovascular: Normal rate, regular rhythm, normal heart sounds. Exam reveals no gallop and no friction rub. No murmur heard.  Pulmonary/Chest: Effort normal and breath sounds normal. No stridor. No respiratory distress. He has no wheezes. He has no rales. He exhibits no tenderness.  Abdominal: Soft. Bowel sounds are normal. He exhibits no distension. There is no tenderness. There is no rebound and no guarding.  Musculoskeletal: Normal range of motion. He exhibits no edema and no tenderness.  Neurological: He is alert and oriented to person, place, and time. Coordination normal.  Skin: Skin is warm and dry. No rash noted. He is not diaphoretic. No erythema. No pallor.  Psychiatric: He has a normal mood and affect. His behavior is normal. Judgment and thought content normal.      ASSESSMENT AND PLAN

## 2014-08-16 NOTE — Patient Instructions (Signed)
Medication Instructions: Decrease Carvedilol to 6.25 mg twice daily. You can take an extra dose if blood pressure above 150  Labwork: None.   Procedures/Testing: None.   Follow-Up: Keep follow up next month.   Any Additional Special Instructions Will Be Listed Below (If Applicable).

## 2014-08-17 ENCOUNTER — Encounter: Payer: Self-pay | Admitting: Gastroenterology

## 2014-08-19 ENCOUNTER — Other Ambulatory Visit: Payer: Self-pay | Admitting: Family Medicine

## 2014-08-30 NOTE — Telephone Encounter (Signed)
ERROR

## 2014-09-06 ENCOUNTER — Other Ambulatory Visit: Payer: Self-pay

## 2014-09-06 ENCOUNTER — Emergency Department
Admission: EM | Admit: 2014-09-06 | Discharge: 2014-09-06 | Disposition: A | Discharge status: Home / Self Care | Payer: Medicaid Other | Attending: Student | Admitting: Student

## 2014-09-06 ENCOUNTER — Encounter: Payer: Self-pay | Admitting: *Deleted

## 2014-09-06 ENCOUNTER — Telehealth: Payer: Self-pay

## 2014-09-06 ENCOUNTER — Ambulatory Visit: Payer: Self-pay | Admitting: Gastroenterology

## 2014-09-06 DIAGNOSIS — E875 Hyperkalemia: Secondary | ICD-10-CM | POA: Insufficient documentation

## 2014-09-06 DIAGNOSIS — Z79899 Other long term (current) drug therapy: Secondary | ICD-10-CM | POA: Insufficient documentation

## 2014-09-06 DIAGNOSIS — R42 Dizziness and giddiness: Secondary | ICD-10-CM | POA: Diagnosis not present

## 2014-09-06 DIAGNOSIS — I129 Hypertensive chronic kidney disease with stage 1 through stage 4 chronic kidney disease, or unspecified chronic kidney disease: Secondary | ICD-10-CM | POA: Insufficient documentation

## 2014-09-06 DIAGNOSIS — N183 Chronic kidney disease, stage 3 (moderate): Secondary | ICD-10-CM | POA: Diagnosis not present

## 2014-09-06 DIAGNOSIS — Z7982 Long term (current) use of aspirin: Secondary | ICD-10-CM | POA: Insufficient documentation

## 2014-09-06 LAB — CBC
HCT: 38.1 % — ABNORMAL LOW (ref 40.0–52.0)
Hemoglobin: 12.6 g/dL — ABNORMAL LOW (ref 13.0–18.0)
MCH: 29.3 pg (ref 26.0–34.0)
MCHC: 32.9 g/dL (ref 32.0–36.0)
MCV: 89.1 fL (ref 80.0–100.0)
Platelets: 160 10*3/uL (ref 150–440)
RBC: 4.28 MIL/uL — AB (ref 4.40–5.90)
RDW: 14.5 % (ref 11.5–14.5)
WBC: 6.9 10*3/uL (ref 3.8–10.6)

## 2014-09-06 LAB — BASIC METABOLIC PANEL
ANION GAP: 8 (ref 5–15)
BUN: 27 mg/dL — AB (ref 6–20)
CALCIUM: 9.3 mg/dL (ref 8.9–10.3)
CO2: 22 mmol/L (ref 22–32)
Chloride: 106 mmol/L (ref 101–111)
Creatinine, Ser: 1.74 mg/dL — ABNORMAL HIGH (ref 0.61–1.24)
GFR calc Af Amer: 49 mL/min — ABNORMAL LOW (ref >60)
GFR calc non Af Amer: 42 mL/min — ABNORMAL LOW (ref >60)
Glucose, Bld: 176 mg/dL — ABNORMAL HIGH (ref 65–99)
Potassium: 5.3 mmol/L — ABNORMAL HIGH (ref 3.5–5.1)
SODIUM: 136 mmol/L (ref 135–145)

## 2014-09-06 LAB — TROPONIN I: Troponin I: <0.03 ng/mL (ref <0.031)

## 2014-09-06 LAB — POTASSIUM
Potassium: 5.6 mmol/L — ABNORMAL HIGH (ref 3.5–5.1)
Potassium: 5.5 mmol/L — ABNORMAL HIGH (ref 3.5–5.1)

## 2014-09-06 MED ORDER — SODIUM CHLORIDE 0.9 % IV BOLUS (SEPSIS): 1000.0000 mL | Freq: Once | INTRAVENOUS | Status: DC

## 2014-09-06 MED ORDER — SODIUM CHLORIDE 0.9 % IV BOLUS (SEPSIS)
500.0000 mL | Freq: Once | INTRAVENOUS | Status: AC
  Administered 2014-09-06: 500 mL via INTRAVENOUS

## 2014-09-06 MED ORDER — ACETAMINOPHEN 500 MG PO TABS
1000.0000 mg | ORAL_TABLET | Freq: Once | ORAL | Status: AC
  Administered 2014-09-06: 1000 mg via ORAL
  Filled 2014-09-06: qty 2

## 2014-09-06 NOTE — ED Provider Notes (Signed)
[facility] Emergency Department Provider Note  ____________________________________________  Time seen: Approximately 5:38 PM  I have reviewed the triage vital signs and the nursing notes.   HISTORY  Chief Complaint Dizziness    HPI Angel Cruz is a 59 y.o. male with history of coronary artery disease, diabetes, hypertension, Hyponatremia, stage III chronic kidney disease, CHF with EF of 35-40% who presents for evaluation of hypotension and positional lightheadedness, intermittent, gradual onset, ongoing for 3-4 days. Patient reports that when he attempts to stand up, he becomes lightheaded. He has not fainted. He took his blood pressure today and it was "77/64". Additionally, he has been having multiple episodes of nonbloody diarrhea for the past 2 days which appears to be improving today. No fevers. No abdominal pain, no vomiting. Current severity of symptoms is mild. No modifying factors. No chest pain, no difficulty breathing. He is complaining of some mild pain in neck and lower back which has been ongoing for several days.   Past Medical History  Diagnosis Date  . Diabetes mellitus without complication   . Hypertension   . Hyperlipidemia   . Depression   . Rotator cuff tear   . CKD (chronic kidney disease), stage III   . Ischemic cardiomyopathy     a. 10/2013 Echo: EF 35-40%, severe distal anteroseptal, anterior, and apical HK. Diast dysfxn, mild conc LVH, mildly dil LA, mild Ao sclerosis w/o stenosis.  Marland Kitchen CAD (coronary artery disease)     a. 10/2013 Lexi MV: EF 31%, apical, septal, ant-apical, inf-apical, lat-apical scar, septal and apical peri-infarct ischemia;  b. 10/2013 Cath: LM nl, LAD 20ost, 122m (faint R->L collats), D1 80p, LCX min irregs, OM1 min irregs, OM2 nl, OM3 30p, RCA 20p, PDA 60, RPL min irregs-->Med Rx.  . Morbid obesity   . CHF (congestive heart failure)   . Depression   . OSA (obstructive sleep apnea)   . Sleep apnea     recent test  confirmed diagnosis, was performed at the sleep center across the street    Patient Active Problem List   Diagnosis Date Noted  . Angioedema 07/30/2014  . RLS (restless legs syndrome) 07/30/2014  . B12 deficiency 07/30/2014  . Vitamin D deficiency 07/30/2014  . Chronic systolic heart failure 11/14/2013  . Ischemic cardiomyopathy   . CAD (coronary artery disease)   . Hypertension   . Hyperlipidemia   . 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 Depression   . CKD (chronic kidney disease), stage III     Past Surgical History  Procedure Laterality Date  . Cardiac catheterization  10/19/2013    [facility]  . Uvulectomy  1995  . Atrial fibrillation ablation  1995  . Elbow surgery Left   . Rotator cuff repair    . Carpal tunnel release      Current Outpatient Rx  Name  Route  Sig  Dispense  Refill  . aspirin EC 81 MG tablet   Oral   Take 81 mg by mouth daily.         . carvedilol (COREG) 6.25 MG tablet   Oral   Take 1 tablet (6.25 mg total) by mouth 2 (two) times daily.   180 tablet   3   . Cyanocobalamin (VITAMIN B-12 PO)   Oral   Take 1 tablet by mouth daily.         Marland Kitchen escitalopram (LEXAPRO) 10 MG tablet   Oral   Take 1 tablet (10 mg total) by  mouth daily.   30 tablet   1   . Insulin Degludec (TRESIBA FLEXTOUCH) 200 UNIT/ML SOPN   Subcutaneous   Inject 10 Units into the skin daily.   5 pen   2   . metFORMIN (GLUCOPHAGE) 850 MG tablet   Oral   Take 850 mg by mouth 2 (two) times daily with a meal.         . nitroGLYCERIN (NITROSTAT) 0.4 MG SL tablet   Sublingual   Place 0.4 mg under the tongue every 5 (five) minutes as needed for chest pain.         Marland Kitchen QUEtiapine (SEROQUEL) 100 MG tablet   Oral   Take 100 mg by mouth at bedtime.         Marland Kitchen rOPINIRole (REQUIP) 0.5 MG tablet   Oral   Take 1 tablet (0.5 mg total) by mouth at bedtime.   30 tablet   5   . rosuvastatin (CRESTOR) 20 MG tablet   Oral   Take 20 mg by mouth at  bedtime.          . sacubitril-valsartan (ENTRESTO) 24-26 MG   Oral   Take 1 tablet by mouth 2 (two) times daily.   60 tablet   3     Allergies Review of patient's allergies indicates no known allergies.  Family History  Problem Relation Age of Onset  . Stroke Mother   . COPD Mother   . Heart failure Mother   . Heart attack Mother   . Colon cancer Father   . Diabetes Father   . Diabetes Brother   . Cancer Brother     Social History History  Substance Use Topics  . Smoking status: Never Smoker   . Smokeless tobacco: Not on file  . Alcohol Use: No    Review of Systems Constitutional: No fever/chills Eyes: No visual changes. ENT: No sore throat. Cardiovascular: Denies chest pain. Respiratory: Denies shortness of breath. Gastrointestinal: No abdominal pain.  No nausea, no vomiting.  + diarrhea.  No constipation. Genitourinary: Negative for dysuria. Musculoskeletal: Positive for lower back pain. Skin: Negative for rash. Neurological: Negative for headaches, focal weakness or numbness.  10-point ROS otherwise negative.  ____________________________________________   PHYSICAL EXAM:  VITAL SIGNS: ED Triage Vitals  Enc Vitals Group     BP 09/06/14 1533 125/78 mmHg     Pulse Rate 09/06/14 1533 64     Resp 09/06/14 1533 20     Temp 09/06/14 1533 98 F (36.7 C)     Temp Source 09/06/14 1533 Oral     SpO2 09/06/14 1533 98 %     Weight 09/06/14 1533 213 lb (96.616 kg)     Height 09/06/14 1533 5\' 7"  (1.702 m)     Head Cir --      Peak Flow --      Pain Score 09/06/14 1533 7     Pain Loc --      Pain Edu? --      Excl. in GC? --     Constitutional: Alert and oriented. Well appearing and in no acute distress. Eyes: Conjunctivae are normal. PERRL. EOMI. Head: Atraumatic. Nose: No congestion/rhinnorhea. Mouth/Throat: Mucous membranes are moist.  Oropharynx non-erythematous. Neck: No stridor.   Cardiovascular: Normal rate, regular rhythm. Grossly normal  heart sounds.  Good peripheral circulation. Respiratory: Normal respiratory effort.  No retractions. Lungs CTAB. Gastrointestinal: Soft and nontender. No distention. No abdominal bruits. No CVA tenderness. Genitourinary: deferred Musculoskeletal: No lower extremity  tenderness nor edema.  No joint effusions. Neurologic:  Normal speech and language. No gross focal neurologic deficits are appreciated. No gait instability. No saddle anesthesia. Strong dorsi flexion of bilateral big toes. Skin:  Skin is warm, dry and intact. No rash noted. Psychiatric: Mood and affect are normal. Speech and behavior are normal.  ____________________________________________   LABS (all labs ordered are listed, but only abnormal results are displayed)  Labs Reviewed  BASIC METABOLIC PANEL - Abnormal; Notable for the following:    Potassium 5.3 (*)    Glucose, Bld 176 (*)    BUN 27 (*)    Creatinine, Ser 1.74 (*)    GFR calc non Af Amer 42 (*)    GFR calc Af Amer 49 (*)    All other components within normal limits  CBC - Abnormal; Notable for the following:    RBC 4.28 (*)    Hemoglobin 12.6 (*)    HCT 38.1 (*)    All other components within normal limits  POTASSIUM - Abnormal; Notable for the following:    Potassium 5.6 (*)    All other components within normal limits  POTASSIUM - Abnormal; Notable for the following:    Potassium 5.5 (*)    All other components within normal limits  TROPONIN I   ____________________________________________  EKG  ED ECG REPORT I, Gayla Doss, the attending physician, personally viewed and interpreted this ECG.   Date: 09/06/2014  EKG Time: 15:48  Rate: 66  Rhythm: normal EKG, normal sinus rhythm  Axis: normal  Intervals:none  ST&T Change: No acute ST segment elevation, no peaked T waves or QRS widening.  ____________________________________________  RADIOLOGY  none ____________________________________________   PROCEDURES  Procedure(s)  performed: None  Critical Care performed: No  ____________________________________________   INITIAL IMPRESSION / ASSESSMENT AND PLAN / ED COURSE  Pertinent labs & imaging results that were available during my care of the patient were reviewed by me and considered in my medical decision making (see chart for details).  Angel Cruz is a 59 y.o. male with history of coronary artery disease, diabetes, hypertension, stage III chronic kidney disease, CHF with EF of 35-40% who presents for evaluation of hypotension and positional lightheadedness. On exam, he is very well-appearing and in no acute distress. He is not at all hypotensive here in the emergency department however he does have orthostatic changes in blood pressure when he stands. Will give light IV fluids given history of CHF and reassess. Suspect dehydration in the setting of recent diarrheal illness which has nearly completely resolved. No chest pain or difficulty breathing, intact neuro exam, doubt ACS or acute CVA. Neck and back pain are nonspecific, not consistent with meningitis, cauda equina or epidural abscess.   ----------------------------------------- 10:23 PM on 09/06/2014 -----------------------------------------  Patient's potassium was 5.6 earlier today, CKD is stable, no acute hyperkalemic EKG changes. At time of discharge, repeat potassium 5.5. On chart review, he appears to frequently have potassiums in this range and they seem to be independent of creatinine. He is followed by Dr. Kirke Corin who has recommended strict low potassium diet. Per Dr. Jari Sportsman note, the patient has been noted to have labile blood pressure including hypotension and they are attempting to regulate his medications so that he is not so symptomatic. He feels better after small fluid bolus and since he feels better, I am reluctant to give him any additional fluids in the setting of his compromised ejection fraction. I discussed case with Dr. Jens Som, on  call  for Dr. Kirke Corin and the patient will be seen tomorrow in clinic for continued titration of blood pressure medicines and recheck of potassium. I discussed return precautions with the patient and his wife at bedside and they're comfortable with discharge plan. ____________________________________________   FINAL CLINICAL IMPRESSION(S) / ED DIAGNOSES  Final diagnoses:  Orthostatic lightheadedness  Hyperkalemia      Gayla Doss, MD 09/06/14 2227

## 2014-09-06 NOTE — ED Notes (Signed)
Since yesterday has had some low b/p, c/o neck pain and low back pain, some dizziness,

## 2014-09-06 NOTE — Telephone Encounter (Signed)
Wife called the nurse line stating that her husband's blood pressure was 75/64 and 72/56. She stated that he feels light-headed when he stands up and has been having some chest pains. After consulting with Dr. Sherley Bounds, this patient's wife was encouraged to take him to the nearest ER since he might need IV fluids. Wife stated that she figured that we were going to say that and was already heading that way although he was upset about going anywhere. She was encourage to take him there as soon as possible then follow up here afterwards.

## 2014-09-06 NOTE — ED Notes (Signed)

## 2014-09-07 ENCOUNTER — Ambulatory Visit (INDEPENDENT_AMBULATORY_CARE_PROVIDER_SITE_OTHER): Payer: Medicaid Other | Admitting: Cardiovascular Disease

## 2014-09-07 ENCOUNTER — Encounter: Payer: Self-pay | Admitting: Cardiovascular Disease

## 2014-09-07 VITALS — BP 92/60 | HR 62 | Ht 67.0 in | Wt 214.5 lb

## 2014-09-07 DIAGNOSIS — I5022 Chronic systolic (congestive) heart failure: Secondary | ICD-10-CM | POA: Diagnosis not present

## 2014-09-07 DIAGNOSIS — R079 Chest pain, unspecified: Secondary | ICD-10-CM

## 2014-09-07 DIAGNOSIS — I10 Essential (primary) hypertension: Secondary | ICD-10-CM

## 2014-09-07 DIAGNOSIS — N183 Chronic kidney disease, stage 3 unspecified: Secondary | ICD-10-CM

## 2014-09-07 DIAGNOSIS — E785 Hyperlipidemia, unspecified: Secondary | ICD-10-CM

## 2014-09-07 DIAGNOSIS — I951 Orthostatic hypotension: Secondary | ICD-10-CM

## 2014-09-07 DIAGNOSIS — I2510 Atherosclerotic heart disease of native coronary artery without angina pectoris: Secondary | ICD-10-CM

## 2014-09-07 DIAGNOSIS — R0602 Shortness of breath: Secondary | ICD-10-CM | POA: Diagnosis not present

## 2014-09-07 DIAGNOSIS — E875 Hyperkalemia: Secondary | ICD-10-CM | POA: Insufficient documentation

## 2014-09-07 MED ORDER — CARVEDILOL 3.125 MG PO TABS: 3.1250 mg | ORAL_TABLET | Freq: Two times a day (BID) | ORAL | Status: DC

## 2014-09-07 NOTE — Assessment & Plan Note (Signed)
Recommended he decrease the Coreg down to 3.125 mg twice a day Only take daily for continued low blood pressure Also blood pressure runs in the 90s, would hold the entresto until blood pressure improves Again increase fluid intake for days when blood pressure is in the 90s

## 2014-09-07 NOTE — Assessment & Plan Note (Signed)
Creatinine running above his baseline. Suggested he increase his fluid intake

## 2014-09-07 NOTE — Assessment & Plan Note (Signed)
Suspect high potassium from dietary indiscretion. He denies taking potassium supplement. He does have some powdered drink supplements he uses.  We'll recheck lab work today Encouraged increased by mouth fluid intake, water.

## 2014-09-07 NOTE — Assessment & Plan Note (Signed)
Currently with no symptoms of angina. No further workup at this time. Continue current medication regimen. 

## 2014-09-07 NOTE — Assessment & Plan Note (Signed)
Cholesterol is at goal on the current lipid regimen. No changes to the medications were made.  

## 2014-09-07 NOTE — Assessment & Plan Note (Signed)
Creatinine is elevated on recent lab work, blood pressure is low Currently not on a diuretic. Encouraged him to increase his fluid intake

## 2014-09-07 NOTE — Patient Instructions (Addendum)
For low blood pressure: hold the coreg and entresto as needed Drink more water  Please cut the coreg down to 3.125 mg twice a day (cut the pill in 1/2) on a regular basis  Please call us if you have new issues that need to be addressed before your next appt.  Your physician wants you to follow-up in: 1 months with Dr. Kirke Corin

## 2014-09-07 NOTE — Progress Notes (Signed)
Patient ID: Angel Cruz, male    DOB: 1955/11/25, 59 y.o.   MRN: [id number]  HPI Comments: 59 year old male with a history of coronary artery disease and chronic systolic heart failure. He presents today for follow-up after being in the emergency room yesterday for dizziness, elated potassium  Review of the records indicates labile blood pressures Prior history of hyperkalemia.  He denies having high potassium intake though does report using a powdered cocktail for his water. Unclear if this is high in potassium but he denies this. Reports feeling somewhat dizzy this morning, has not taken his morning medications. Systolic pressure in the 90s Reports he has been drinking fluids but did have several days of diarrhea last weekend of uncertain etiology He is given IV fluids in the emergency room yesterday and discharged with follow-up today  Carvedilol dose has been slowly decreased over his last several visits for hypotension  EKG on today's visit shows normal sinus rhythm with rate 62 bpm, no significant ST or T-wave changes  Other past medical history He was admitted to [facility] in September, 2015 with complaints of cough and chest pain.  He ruled out for myocardial infarction.   He underwent stress testing which revealed apical infarct with peri-infarct ischemia.  EF was 31%.   Echocardiogram showed EF 35-40% with anteroseptal, anterior, apical wall motion abnormalities. cardiac catheterization which revealed an occluded mid LAD with otherwise nonobstructive CAD.  There were faint right to left collaterals supplying the distal LAD distribution.     No Known Allergies  Current Outpatient Prescriptions on File Prior to Visit  Medication Sig Dispense Refill  . aspirin EC 81 MG tablet Take 81 mg by mouth daily.    . Cyanocobalamin (VITAMIN B-12 PO) Take 1 tablet by mouth daily.    Marland Kitchen escitalopram (LEXAPRO) 10 MG tablet Take 1 tablet (10 mg total) by mouth daily.  30 tablet 1  . Insulin Degludec (TRESIBA FLEXTOUCH) 200 UNIT/ML SOPN Inject 10 Units into the skin daily. 5 pen 2  . metFORMIN (GLUCOPHAGE) 850 MG tablet Take 850 mg by mouth 2 (two) times daily with a meal.    . nitroGLYCERIN (NITROSTAT) 0.4 MG SL tablet Place 0.4 mg under the tongue every 5 (five) minutes as needed for chest pain.    Marland Kitchen QUEtiapine (SEROQUEL) 100 MG tablet Take 100 mg by mouth at bedtime.    Marland Kitchen rOPINIRole (REQUIP) 0.5 MG tablet Take 1 tablet (0.5 mg total) by mouth at bedtime. 30 tablet 5  . rosuvastatin (CRESTOR) 20 MG tablet Take 20 mg by mouth at bedtime.     . sacubitril-valsartan (ENTRESTO) 24-26 MG Take 1 tablet by mouth 2 (two) times daily. 60 tablet 3   No current facility-administered medications on file prior to visit.    Past Medical History  Diagnosis Date  . Diabetes mellitus without complication   . Hypertension   . Hyperlipidemia   . Depression   . Rotator cuff tear   . CKD (chronic kidney disease), stage III   . Ischemic cardiomyopathy     a. 10/2013 Echo: EF 35-40%, severe distal anteroseptal, anterior, and apical HK. Diast dysfxn, mild conc LVH, mildly dil LA, mild Ao sclerosis w/o stenosis.  Marland Kitchen CAD (coronary artery disease)     a. 10/2013 Lexi MV: EF 31%, apical, septal, ant-apical, inf-apical, lat-apical scar, septal and apical peri-infarct ischemia;  b. 10/2013 Cath: LM nl, LAD 20ost, 128m (faint R->L collats), D1 80p, LCX min irregs, OM1 min irregs, OM2  nl, OM3 30p, RCA 20p, PDA 60, RPL min irregs-->Med Rx.  . Morbid obesity   . CHF (congestive heart failure)   . Depression   . OSA (obstructive sleep apnea)   . Sleep apnea     recent test confirmed diagnosis, was performed at the sleep center across the street    Past Surgical History  Procedure Laterality Date  . Cardiac catheterization  10/19/2013    [facility]  . Uvulectomy  1995  . Atrial fibrillation ablation  1995  . Elbow surgery Left   . Rotator cuff repair    . Carpal tunnel release       Social History  reports that he has never smoked. He does not have any smokeless tobacco history on file. He reports that he does not drink alcohol or use illicit drugs.  Family History family history includes COPD in his mother; Cancer in his brother; Colon cancer in his father; Diabetes in his brother and father; Heart attack in his mother; Heart failure in his mother; Stroke in his mother.      Review of Systems  Constitutional: Negative.   Respiratory: Negative.   Cardiovascular: Negative.   Gastrointestinal: Negative.   Musculoskeletal: Negative.   Skin: Negative.   Neurological: Positive for light-headedness.  Hematological: Negative.   Psychiatric/Behavioral: Negative.   All other systems reviewed and are negative.   BP 92/60 mmHg  Pulse 62  Ht 5\' 7"  (1.702 m)  Wt 214 lb 8 oz (97.297 kg)  BMI 33.59 kg/m2  Physical Exam  Constitutional: He is oriented to person, place, and time. He appears well-developed and well-nourished.  HENT:  Head: Normocephalic.  Nose: Nose normal.  Mouth/Throat: Oropharynx is clear and moist.  Eyes: Conjunctivae are normal. Pupils are equal, round, and reactive to light.  Neck: Normal range of motion. Neck supple. No JVD present.  Cardiovascular: Normal rate, regular rhythm, S1 normal, S2 normal, normal heart sounds and intact distal pulses.  Exam reveals no gallop and no friction rub.   No murmur heard. Pulmonary/Chest: Effort normal and breath sounds normal. No respiratory distress. He has no wheezes. He has no rales. He exhibits no tenderness.  Abdominal: Soft. Bowel sounds are normal. He exhibits no distension. There is no tenderness.  Musculoskeletal: Normal range of motion. He exhibits no edema or tenderness.  Lymphadenopathy:    He has no cervical adenopathy.  Neurological: He is alert and oriented to person, place, and time. Coordination normal.  Skin: Skin is warm and dry. No rash noted. No erythema.  Psychiatric: He has a  normal mood and affect. His behavior is normal. Judgment and thought content normal.      Assessment and Plan   Nursing note and vitals reviewed.

## 2014-09-08 ENCOUNTER — Observation Stay
Admission: EM | Admit: 2014-09-08 | Discharge: 2014-09-11 | Disposition: A | Discharge status: Home / Self Care | Payer: Medicaid Other | Attending: Specialist | Admitting: Specialist

## 2014-09-08 ENCOUNTER — Encounter: Payer: Self-pay | Admitting: Emergency Medicine

## 2014-09-08 DIAGNOSIS — I130 Hypertensive heart and chronic kidney disease with heart failure and stage 1 through stage 4 chronic kidney disease, or unspecified chronic kidney disease: Secondary | ICD-10-CM | POA: Diagnosis not present

## 2014-09-08 DIAGNOSIS — I951 Orthostatic hypotension: Secondary | ICD-10-CM | POA: Diagnosis present

## 2014-09-08 DIAGNOSIS — R42 Dizziness and giddiness: Secondary | ICD-10-CM | POA: Diagnosis not present

## 2014-09-08 DIAGNOSIS — Z800 Family history of malignant neoplasm of digestive organs: Secondary | ICD-10-CM | POA: Diagnosis not present

## 2014-09-08 DIAGNOSIS — Z823 Family history of stroke: Secondary | ICD-10-CM | POA: Insufficient documentation

## 2014-09-08 DIAGNOSIS — N170 Acute kidney failure with tubular necrosis: Secondary | ICD-10-CM | POA: Insufficient documentation

## 2014-09-08 DIAGNOSIS — E538 Deficiency of other specified B group vitamins: Secondary | ICD-10-CM | POA: Diagnosis not present

## 2014-09-08 DIAGNOSIS — F329 Major depressive disorder, single episode, unspecified: Secondary | ICD-10-CM | POA: Insufficient documentation

## 2014-09-08 DIAGNOSIS — Z825 Family history of asthma and other chronic lower respiratory diseases: Secondary | ICD-10-CM | POA: Insufficient documentation

## 2014-09-08 DIAGNOSIS — E785 Hyperlipidemia, unspecified: Secondary | ICD-10-CM | POA: Diagnosis not present

## 2014-09-08 DIAGNOSIS — I5022 Chronic systolic (congestive) heart failure: Secondary | ICD-10-CM | POA: Insufficient documentation

## 2014-09-08 DIAGNOSIS — I255 Ischemic cardiomyopathy: Secondary | ICD-10-CM | POA: Insufficient documentation

## 2014-09-08 DIAGNOSIS — N183 Chronic kidney disease, stage 3 (moderate): Secondary | ICD-10-CM | POA: Diagnosis not present

## 2014-09-08 DIAGNOSIS — R079 Chest pain, unspecified: Secondary | ICD-10-CM | POA: Diagnosis not present

## 2014-09-08 DIAGNOSIS — G4733 Obstructive sleep apnea (adult) (pediatric): Secondary | ICD-10-CM | POA: Diagnosis not present

## 2014-09-08 DIAGNOSIS — E860 Dehydration: Secondary | ICD-10-CM | POA: Insufficient documentation

## 2014-09-08 DIAGNOSIS — E1165 Type 2 diabetes mellitus with hyperglycemia: Secondary | ICD-10-CM | POA: Insufficient documentation

## 2014-09-08 DIAGNOSIS — Z7982 Long term (current) use of aspirin: Secondary | ICD-10-CM | POA: Insufficient documentation

## 2014-09-08 DIAGNOSIS — Z8249 Family history of ischemic heart disease and other diseases of the circulatory system: Secondary | ICD-10-CM | POA: Insufficient documentation

## 2014-09-08 DIAGNOSIS — I2510 Atherosclerotic heart disease of native coronary artery without angina pectoris: Secondary | ICD-10-CM | POA: Insufficient documentation

## 2014-09-08 DIAGNOSIS — Z833 Family history of diabetes mellitus: Secondary | ICD-10-CM | POA: Insufficient documentation

## 2014-09-08 DIAGNOSIS — E559 Vitamin D deficiency, unspecified: Secondary | ICD-10-CM | POA: Diagnosis not present

## 2014-09-08 DIAGNOSIS — T783XXA Angioneurotic edema, initial encounter: Secondary | ICD-10-CM | POA: Insufficient documentation

## 2014-09-08 DIAGNOSIS — Z809 Family history of malignant neoplasm, unspecified: Secondary | ICD-10-CM | POA: Diagnosis not present

## 2014-09-08 DIAGNOSIS — G2581 Restless legs syndrome: Secondary | ICD-10-CM | POA: Insufficient documentation

## 2014-09-08 DIAGNOSIS — E875 Hyperkalemia: Secondary | ICD-10-CM | POA: Diagnosis not present

## 2014-09-08 DIAGNOSIS — E1122 Type 2 diabetes mellitus with diabetic chronic kidney disease: Secondary | ICD-10-CM | POA: Insufficient documentation

## 2014-09-08 DIAGNOSIS — R05 Cough: Secondary | ICD-10-CM | POA: Insufficient documentation

## 2014-09-08 DIAGNOSIS — R55 Syncope and collapse: Secondary | ICD-10-CM | POA: Diagnosis not present

## 2014-09-08 LAB — BASIC METABOLIC PANEL
Anion gap: 4 — ABNORMAL LOW (ref 5–15)
BUN/Creatinine Ratio: 17 (ref 9–20)
BUN: 30 mg/dL — AB (ref 6–24)
BUN: 33 mg/dL — ABNORMAL HIGH (ref 6–20)
CALCIUM: 9.9 mg/dL (ref 8.7–10.2)
CHLORIDE: 109 mmol/L (ref 101–111)
CO2: 20 mmol/L (ref 18–29)
CO2: 23 mmol/L (ref 22–32)
CREATININE: 1.79 mg/dL — AB (ref 0.76–1.27)
Calcium: 9.7 mg/dL (ref 8.9–10.3)
Chloride: 103 mmol/L (ref 97–108)
Creatinine, Ser: 2.01 mg/dL — ABNORMAL HIGH (ref 0.61–1.24)
GFR calc non Af Amer: 35 mL/min — ABNORMAL LOW (ref >60)
GFR, EST AFRICAN AMERICAN: 48 mL/min/{1.73_m2} — AB (ref >59)
GFR, EST AFRICAN AMERICAN: 41 mL/min — AB (ref >60)
GFR, EST NON AFRICAN AMERICAN: 41 mL/min/{1.73_m2} — AB (ref >59)
Glucose, Bld: 208 mg/dL — ABNORMAL HIGH (ref 65–99)
Glucose: 145 mg/dL — ABNORMAL HIGH (ref 65–99)
POTASSIUM: 5.7 mmol/L — AB (ref 3.5–5.2)
Potassium: 5.6 mmol/L — ABNORMAL HIGH (ref 3.5–5.1)
SODIUM: 138 mmol/L (ref 134–144)
Sodium: 136 mmol/L (ref 135–145)

## 2014-09-08 LAB — TROPONIN I
Troponin I: <0.03 ng/mL (ref <0.031)
Troponin I: <0.03 ng/mL (ref <0.031)

## 2014-09-08 LAB — CBC
HCT: 34.9 % — ABNORMAL LOW (ref 40.0–52.0)
HEMOGLOBIN: 11.4 g/dL — AB (ref 13.0–18.0)
MCH: 29 pg (ref 26.0–34.0)
MCHC: 32.8 g/dL (ref 32.0–36.0)
MCV: 88.4 fL (ref 80.0–100.0)
Platelets: 156 10*3/uL (ref 150–440)
RBC: 3.94 MIL/uL — AB (ref 4.40–5.90)
RDW: 13.7 % (ref 11.5–14.5)
WBC: 5.1 10*3/uL (ref 3.8–10.6)

## 2014-09-08 MED ORDER — ACETAMINOPHEN 650 MG RE SUPP: 650.0000 mg | Freq: Four times a day (QID) | RECTAL | Status: DC | PRN

## 2014-09-08 MED ORDER — NITROGLYCERIN 0.4 MG SL SUBL: 0.4000 mg | SUBLINGUAL_TABLET | SUBLINGUAL | Status: DC | PRN

## 2014-09-08 MED ORDER — SODIUM POLYSTYRENE SULFONATE 15 GM/60ML PO SUSP
30.0000 g | Freq: Once | ORAL | Status: AC
  Administered 2014-09-08: 30 g via ORAL
  Filled 2014-09-08: qty 120

## 2014-09-08 MED ORDER — SODIUM CHLORIDE 0.9 % IV BOLUS (SEPSIS)
1000.0000 mL | Freq: Once | INTRAVENOUS | Status: AC
  Administered 2014-09-08: 1000 mL via INTRAVENOUS

## 2014-09-08 MED ORDER — SODIUM CHLORIDE 0.9 % IV SOLN
INTRAVENOUS | Status: DC
  Administered 2014-09-08 – 2014-09-09 (×2): via INTRAVENOUS

## 2014-09-08 MED ORDER — HEPARIN SODIUM (PORCINE) 5000 UNIT/ML IJ SOLN
5000.0000 [IU] | Freq: Three times a day (TID) | INTRAMUSCULAR | Status: DC
  Administered 2014-09-08 – 2014-09-11 (×8): 5000 [IU] via SUBCUTANEOUS
  Filled 2014-09-08 (×8): qty 1

## 2014-09-08 MED ORDER — ESCITALOPRAM OXALATE 10 MG PO TABS
10.0000 mg | ORAL_TABLET | Freq: Every day | ORAL | Status: DC
  Administered 2014-09-08 – 2014-09-11 (×4): 10 mg via ORAL
  Filled 2014-09-08 (×4): qty 1

## 2014-09-08 MED ORDER — ROSUVASTATIN CALCIUM 20 MG PO TABS
20.0000 mg | ORAL_TABLET | Freq: Every day | ORAL | Status: DC
  Administered 2014-09-08 – 2014-09-10 (×3): 20 mg via ORAL
  Filled 2014-09-08 (×3): qty 1

## 2014-09-08 MED ORDER — ASPIRIN EC 81 MG PO TBEC
81.0000 mg | DELAYED_RELEASE_TABLET | Freq: Every day | ORAL | Status: DC
  Administered 2014-09-08 – 2014-09-11 (×4): 81 mg via ORAL
  Filled 2014-09-08 (×4): qty 1

## 2014-09-08 MED ORDER — ACETAMINOPHEN 325 MG PO TABS: 650.0000 mg | ORAL_TABLET | Freq: Four times a day (QID) | ORAL | Status: DC | PRN

## 2014-09-08 MED ORDER — SENNOSIDES-DOCUSATE SODIUM 8.6-50 MG PO TABS: 1.0000 | ORAL_TABLET | Freq: Every evening | ORAL | Status: DC | PRN

## 2014-09-08 MED ORDER — INSULIN DEGLUDEC 200 UNIT/ML ~~LOC~~ SOPN
10.0000 [IU] | PEN_INJECTOR | Freq: Every day | SUBCUTANEOUS | Status: DC
  Administered 2014-09-09 – 2014-09-10 (×2): 10 [IU] via SUBCUTANEOUS
  Filled 2014-09-08: qty 3

## 2014-09-08 MED ORDER — VITAMIN B-12 100 MCG PO TABS
100.0000 ug | ORAL_TABLET | Freq: Every day | ORAL | Status: DC
  Administered 2014-09-08 – 2014-09-11 (×4): 100 ug via ORAL
  Filled 2014-09-08 (×4): qty 1

## 2014-09-08 MED ORDER — ASPIRIN EC 81 MG PO TBEC: 81.0000 mg | DELAYED_RELEASE_TABLET | Freq: Every day | ORAL | Status: DC

## 2014-09-08 MED ORDER — ONDANSETRON HCL 4 MG PO TABS: 4.0000 mg | ORAL_TABLET | Freq: Four times a day (QID) | ORAL | Status: DC | PRN

## 2014-09-08 MED ORDER — QUETIAPINE FUMARATE 25 MG PO TABS
100.0000 mg | ORAL_TABLET | Freq: Every day | ORAL | Status: DC
  Administered 2014-09-08 – 2014-09-10 (×3): 100 mg via ORAL
  Filled 2014-09-08 (×3): qty 4

## 2014-09-08 MED ORDER — ONDANSETRON HCL 4 MG/2ML IJ SOLN: 4.0000 mg | Freq: Four times a day (QID) | INTRAMUSCULAR | Status: DC | PRN

## 2014-09-08 MED ORDER — ROPINIROLE HCL 0.25 MG PO TABS
0.5000 mg | ORAL_TABLET | Freq: Every day | ORAL | Status: DC
  Administered 2014-09-08 – 2014-09-10 (×3): 0.5 mg via ORAL
  Filled 2014-09-08 (×3): qty 2

## 2014-09-08 NOTE — ED Notes (Signed)
Patient to ER for hypotension. Patient was seen here previously for same and has followed up with cardiologist. Reports dizziness with movement. Per patient, MD told him he was dehydrated. Reports headache and neck pain, that he states he has had with previous hypotension. Also had hyperkalemia.

## 2014-09-08 NOTE — H&P (Signed)
[facility] [place] at [facility]   PATIENT NAME: Angel Cruz    MR#:  [id number]  DATE OF BIRTH:  December 12, 1955  DATE OF ADMISSION:  09/08/2014  PRIMARY CARE PHYSICIAN: Ruel Favors, MD   REQUESTING/REFERRING PHYSICIAN: Quale  CHIEF COMPLAINT:   Dizziness HISTORY OF PRESENT ILLNESS:  Angel Cruz  is a 59 y.o. male with a known history of ischemic cardiomyopathy with recent ejection fraction at 35-40%, systolic congestive heart failure, diverticulosis, hypertension and hyperlipidemia is presenting to the ED with a chief complaint of low blood pressure. As reported by wife patient has been feeling lightheaded for the past 2 weeks and was evaluated in the [place] [facility] ED 2 days ago, he was hydrated with IV fluids and was sent home. Yesterday he was seen by his cardiologist Dr. Mariah Milling regarding the same complaint and his blood pressure medications were discontinued. The patient is still feeling lightheaded and today he was complaining of some chest pressure which made her bring him to the ED. Patient's blood pressure was 97/58 in reviewing posture at home and it was dropping down to 69/47 while standing. Patient was given IV fluid boluses and hospitalist team is called to admit the patient. Reporting diarrhea 4 days ago which was completely resolved  PAST MEDICAL HISTORY:   Past Medical History  Diagnosis Date  . Diabetes mellitus without complication   . Hypertension   . Hyperlipidemia   . Depression   . Rotator cuff tear   . CKD (chronic kidney disease), stage III   . Ischemic cardiomyopathy     a. 10/2013 Echo: EF 35-40%, severe distal anteroseptal, anterior, and apical HK. Diast dysfxn, mild conc LVH, mildly dil LA, mild Ao sclerosis w/o stenosis.  Marland Kitchen CAD (coronary artery disease)     a. 10/2013 Lexi MV: EF 31%, apical, septal, ant-apical, inf-apical, lat-apical scar, septal and apical peri-infarct ischemia;  b. 10/2013 Cath: LM nl, LAD 20ost, 172m  (faint R->L collats), D1 80p, LCX min irregs, OM1 min irregs, OM2 nl, OM3 30p, RCA 20p, PDA 60, RPL min irregs-->Med Rx.  . Morbid obesity   . CHF (congestive heart failure)   . Depression   . OSA (obstructive sleep apnea)   . Sleep apnea     recent test confirmed diagnosis, was performed at the sleep center across the street    PAST SURGICAL HISTOIRY:   Past Surgical History  Procedure Laterality Date  . Cardiac catheterization  10/19/2013    [facility]  . Uvulectomy  1995  . Atrial fibrillation ablation  1995  . Elbow surgery Left   . Rotator cuff repair    . Carpal tunnel release      SOCIAL HISTORY:   History  Substance Use Topics  . Smoking status: Never Smoker   . Smokeless tobacco: Not on file  . Alcohol Use: No    FAMILY HISTORY:   Family History  Problem Relation Age of Onset  . Stroke Mother   . COPD Mother   . Heart failure Mother   . Heart attack Mother   . Colon cancer Father   . Diabetes Father   . Diabetes Brother   . Cancer Brother     DRUG ALLERGIES:  No Known Allergies  REVIEW OF SYSTEMS:  CONSTITUTIONAL: No fever, fatigue or weakness.  EYES: No blurred or double vision.  EARS, NOSE, AND THROAT: No tinnitus or ear pain.  RESPIRATORY: No cough, shortness of breath, wheezing or hemoptysis.  CARDIOVASCULAR: Intermittent episodes  of chest pain, denies orthopnea, edema. Reporting dizziness but no loss of consciousness GASTROINTESTINAL: No nausea, vomiting, diarrhea or abdominal pain. Had diarrhea 4 days ago GENITOURINARY: No dysuria, hematuria.  ENDOCRINE: No polyuria, nocturia,  HEMATOLOGY: No anemia, easy bruising or bleeding SKIN: No rash or lesion. MUSCULOSKELETAL: No joint pain or arthritis.   NEUROLOGIC: No tingling, numbness, weakness.  PSYCHIATRY: No anxiety or depression.   MEDICATIONS AT HOME:   Prior to Admission medications   Medication Sig Start Date End Date Taking? Authorizing Provider  aspirin EC 81 MG tablet Take 81 mg by mouth  daily.   Yes Historical Provider, MD  carvedilol (COREG) 3.125 MG tablet Take 1 tablet (3.125 mg total) by mouth 2 (two) times daily. 09/07/14  Yes Antonieta Iba, MD  Cyanocobalamin (VITAMIN B-12 PO) Take 1 tablet by mouth daily.   Yes Historical Provider, MD  escitalopram (LEXAPRO) 10 MG tablet Take 1 tablet (10 mg total) by mouth daily. 08/06/14  Yes Alba Cory, MD  Insulin Degludec (TRESIBA FLEXTOUCH) 200 UNIT/ML SOPN Inject 10 Units into the skin daily. 08/06/14  Yes Alba Cory, MD  metFORMIN (GLUCOPHAGE) 850 MG tablet Take 850 mg by mouth 2 (two) times daily with a meal.   Yes Historical Provider, MD  nitroGLYCERIN (NITROSTAT) 0.4 MG SL tablet Place 0.4 mg under the tongue every 5 (five) minutes as needed for chest pain.   Yes Historical Provider, MD  QUEtiapine (SEROQUEL) 100 MG tablet Take 100 mg by mouth at bedtime.   Yes Historical Provider, MD  rOPINIRole (REQUIP) 0.5 MG tablet Take 1 tablet (0.5 mg total) by mouth at bedtime. 07/30/14  Yes Alba Cory, MD  rosuvastatin (CRESTOR) 20 MG tablet Take 20 mg by mouth at bedtime.    Yes Historical Provider, MD  sacubitril-valsartan (ENTRESTO) 24-26 MG Take 1 tablet by mouth 2 (two) times daily. 05/16/14  Yes Iran Ouch, MD      VITAL SIGNS:  Blood pressure 153/84, pulse 55, temperature 98.2 F (36.8 C), temperature source Oral, resp. rate 22, height 5\' 7"  (1.702 m), weight 97.07 kg (214 lb), SpO2 99 %.  PHYSICAL EXAMINATION:  GENERAL:  59 y.o.-year-old patient lying in the bed with no acute distress.  EYES: Pupils equal, round, reactive to light and accommodation. No scleral icterus. Extraocular muscles intact.  HEENT: Head atraumatic, normocephalic. Oropharynx and nasopharynx clear.  NECK:  Supple, no jugular venous distention. No thyroid enlargement, no tenderness.  LUNGS: Normal breath sounds bilaterally, no wheezing, rales,rhonchi or crepitation. No use of accessory muscles of respiration.  CARDIOVASCULAR: S1, S2 normal.  No murmurs, rubs, or gallops.  ABDOMEN: Soft, nontender, nondistended. Bowel sounds present. No organomegaly or mass.  EXTREMITIES: No pedal edema, cyanosis, or clubbing.  NEUROLOGIC: Cranial nerves II through XII are intact. Muscle strength 5/5 in all extremities. Sensation intact. Gait not checked.  PSYCHIATRIC: The patient is alert and oriented x 3.  SKIN: No obvious rash, lesion, or ulcer.   LABORATORY PANEL:   CBC  Recent Labs Lab 09/08/14 1514  WBC 5.1  HGB 11.4*  HCT 34.9*  PLT 156   ------------------------------------------------------------------------------------------------------------------  Chemistries   Recent Labs Lab 09/08/14 1514  NA 136  K 5.6*  CL 109  CO2 23  GLUCOSE 208*  BUN 33*  CREATININE 2.01*  CALCIUM 9.7   ------------------------------------------------------------------------------------------------------------------  Cardiac Enzymes  Recent Labs Lab 09/08/14 1514  TROPONINI <0.03   ------------------------------------------------------------------------------------------------------------------  RADIOLOGY:  No results found.  EKG:   Orders placed or performed in visit  on 09/07/14  . EKG 12-Lead    IMPRESSION AND PLAN:   Angel Cruz  is a 59 y.o. male with a known history of ischemic cardiomyopathy with recent ejection fraction at 35-40%, systolic congestive heart failure, diverticulosis, hypertension and hyperlipidemia is presenting to the ED with a chief complaint of low blood pressure. As reported by wife patient has been feeling lightheaded for the past 2 weeks and was evaluated in the [place] [facility] ED 2 days ago, he was hydrated with IV fluids and was sent home. Yesterday he was seen by his cardiologist Dr. Mariah Milling regarding the same complaint and his blood pressure medications were discontinued. The patient is still feeling lightheaded and today he was complaining of some chest pressure which made her bring him to the  ED.  1. Near syncope secondary to orthostatic hypotension/component of dehydration from recent diarrhea Admit him to telemetry Patient is significantly orthostatic in the emergency department Discontinue his medication Coreg and Entresto Provide gentle hydration with IV fluids Monitor for symptoms and signs of fluid overload in view of cardiomyopathy with ejection fraction 35-40% Cardiology consult to Dr. Lewie Loron  2. Chest discomfort with history of coronary artery disease   monitor him closely on telemetry Cycle cardiac biomarkers Continue baby aspirin, sublingual nitroglycerin as needed basis. Holding Coreg in view of dizziness Continue statin, check fasting lipid panel in a.m. Recent cardiac catheterization in October has revealed one-vessel blockage but cardiology has recommended medical management   3. Insulin requiring diabetes mellitus Provide sliding scale insulin Diabetic diet Hold metformin in view of acute kidney injury Resume Tresiba insulin which is his home medication  4. Acute kidney injury on chronic kidney disease 3 Hold metformin and valsartan Hydration with IV fluids Avoid nephrotoxins Check BMP in a.m.  5. Hyperkalemia-no acute EKG changes Provide Kayexalate and check BMP in a.m.   DVT per flexes with heparin subcutaneous GI prophylaxis with Protonix  All the records are reviewed and case discussed with ED provider. Management plans discussed with the patient, family and they are in agreement.  CODE STATUS: Full code, wife is the healthcare power of attorney  TOTAL TIME TAKING CARE OF THIS PATIENT: Reviewing medical records, history and physical, admission orders and coordination of care-45 minutes.    Ramonita Lab M.D on 09/08/2014 at 6:22 PM  Between 7am to 6pm - Pager - [phone]  After 6pm go to [email or web address] - password [email or web address]  [place] [facility]  Office  [phone]  CC: Primary care physician; Ruel Favors, MD

## 2014-09-08 NOTE — ED Notes (Signed)
Pt states he get lightheaded when he stands up with occasional headache.

## 2014-09-08 NOTE — ED Provider Notes (Signed)
[facility] Emergency Department Provider Note  ____________________________________________  Time seen: Approximately 4:55 PM  I have reviewed the triage vital signs and the nursing notes.   HISTORY  Chief Complaint Hypotension    HPI Angel Cruz is a 59 y.o. male who presents today with ongoing low blood pressure with standing. Wife reports along with the patient that for about 2 weeks now he isn't expressing lightheadedness with walking, came to the ER about 2 days ago and was seen and hydrated and sent home and held some of his medications. He says cardiologist, and for the last 24 hours he has not had any of his blood pressure medicines despite this he continues to feel lightheaded with standing. He also mentions the cardiologist felt that he was still dry and dehydrated yesterday in the clinic.  No pain, fevers, chills, chest pain or other symptoms. He doesn't feel short of breath. He just reports a lightheadedness though is given up as though when he goes to stand or walk. He had a blood pressure slowly 67 systolic while standing at home.   Past Medical History  Diagnosis Date  . Diabetes mellitus without complication   . Hypertension   . Hyperlipidemia   . Depression   . Rotator cuff tear   . CKD (chronic kidney disease), stage III   . Ischemic cardiomyopathy     a. 10/2013 Echo: EF 35-40%, severe distal anteroseptal, anterior, and apical HK. Diast dysfxn, mild conc LVH, mildly dil LA, mild Ao sclerosis w/o stenosis.  Marland Kitchen CAD (coronary artery disease)     a. 10/2013 Lexi MV: EF 31%, apical, septal, ant-apical, inf-apical, lat-apical scar, septal and apical peri-infarct ischemia;  b. 10/2013 Cath: LM nl, LAD 20ost, 159m (faint R->L collats), D1 80p, LCX min irregs, OM1 min irregs, OM2 nl, OM3 30p, RCA 20p, PDA 60, RPL min irregs-->Med Rx.  . Morbid obesity   . CHF (congestive heart failure)   . Depression   . OSA (obstructive sleep apnea)   . Sleep  apnea     recent test confirmed diagnosis, was performed at the sleep center across the street    Patient Active Problem List   Diagnosis Date Noted  . Orthostatic hypotension 09/07/2014  . Hyperkalemia 09/07/2014  . Angioedema 07/30/2014  . RLS (restless legs syndrome) 07/30/2014  . B12 deficiency 07/30/2014  . Vitamin D deficiency 07/30/2014  . Chronic systolic heart failure 11/14/2013  . Ischemic cardiomyopathy   . CAD (coronary artery disease)   . Hypertension   . Hyperlipidemia   . 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 Depression   . CKD (chronic kidney disease), stage III     Past Surgical History  Procedure Laterality Date  . Cardiac catheterization  10/19/2013    [facility]  . Uvulectomy  1995  . Atrial fibrillation ablation  1995  . Elbow surgery Left   . Rotator cuff repair    . Carpal tunnel release      Current Outpatient Rx  Name  Route  Sig  Dispense  Refill  . aspirin EC 81 MG tablet   Oral   Take 81 mg by mouth daily.         . carvedilol (COREG) 3.125 MG tablet   Oral   Take 1 tablet (3.125 mg total) by mouth 2 (two) times daily.   180 tablet   3   . Cyanocobalamin (VITAMIN B-12 PO)   Oral   Take 1 tablet by mouth  daily.         . escitalopram (LEXAPRO) 10 MG tablet   Oral   Take 1 tablet (10 mg total) by mouth daily.   30 tablet   1   . Insulin Degludec (TRESIBA FLEXTOUCH) 200 UNIT/ML SOPN   Subcutaneous   Inject 10 Units into the skin daily.   5 pen   2   . metFORMIN (GLUCOPHAGE) 850 MG tablet   Oral   Take 850 mg by mouth 2 (two) times daily with a meal.         . nitroGLYCERIN (NITROSTAT) 0.4 MG SL tablet   Sublingual   Place 0.4 mg under the tongue every 5 (five) minutes as needed for chest pain.         Marland Kitchen QUEtiapine (SEROQUEL) 100 MG tablet   Oral   Take 100 mg by mouth at bedtime.         Marland Kitchen rOPINIRole (REQUIP) 0.5 MG tablet   Oral   Take 1 tablet (0.5 mg total) by mouth at bedtime.   30  tablet   5   . rosuvastatin (CRESTOR) 20 MG tablet   Oral   Take 20 mg by mouth at bedtime.          . sacubitril-valsartan (ENTRESTO) 24-26 MG   Oral   Take 1 tablet by mouth 2 (two) times daily.   60 tablet   3     Allergies Review of patient's allergies indicates no known allergies.  Family History  Problem Relation Age of Onset  . Stroke Mother   . COPD Mother   . Heart failure Mother   . Heart attack Mother   . Colon cancer Father   . Diabetes Father   . Diabetes Brother   . Cancer Brother     Social History History  Substance Use Topics  . Smoking status: Never Smoker   . Smokeless tobacco: Not on file  . Alcohol Use: No    Review of Systems Constitutional: No fever/chills Eyes: No visual changes. ENT: No sore throat. Cardiovascular: Denies chest pain. Respiratory: Denies shortness of breath. Gastrointestinal: No abdominal pain.  No nausea, no vomiting.  No diarrhea.  No constipation. Genitourinary: Negative for dysuria. Musculoskeletal: Negative for back pain. Skin: Negative for rash. Neurological: Negative for headaches, focal weakness or numbness.  No speech changes. No weakness in arm or leg. No facial droop. No headache.  10-point ROS otherwise negative.  ____________________________________________   PHYSICAL EXAM:  VITAL SIGNS: ED Triage Vitals  Enc Vitals Group     BP 09/08/14 1504 85/46 mmHg     Pulse Rate 09/08/14 1504 65     Resp 09/08/14 1504 18     Temp 09/08/14 1504 98.2 F (36.8 C)     Temp Source 09/08/14 1504 Oral     SpO2 09/08/14 1504 95 %     Weight 09/08/14 1504 214 lb (97.07 kg)     Height 09/08/14 1504 5\' 7"  (1.702 m)     Head Cir --      Peak Flow --      Pain Score 09/08/14 1505 6     Pain Loc --      Pain Edu? --      Excl. in GC? --     Constitutional: Alert and oriented. Well appearing and in no acute distress. Eyes: Conjunctivae are normal. PERRL. EOMI. Head: Atraumatic. Nose: No  congestion/rhinnorhea. Mouth/Throat: Mucous membranes are slightly dry.  Oropharynx non-erythematous. Neck: No stridor.  Cardiovascular: Normal rate, regular rhythm. Grossly normal heart sounds.  Good peripheral circulation. Respiratory: Normal respiratory effort.  No retractions. Lungs CTAB. Gastrointestinal: Soft and nontender. No distention. No abdominal bruits. No CVA tenderness. Musculoskeletal: No lower extremity tenderness nor edema.  No joint effusions. Neurologic:  Normal speech and language. No gross focal neurologic deficits are appreciated. Skin:  Skin is warm, dry and intact. No rash noted. Psychiatric: Mood and affect are normal. Speech and behavior are normal. No JVD, no peripheral edema. ____________________________________________   LABS (all labs ordered are listed, but only abnormal results are displayed)  Labs Reviewed  CBC - Abnormal; Notable for the following:    RBC 3.94 (*)    Hemoglobin 11.4 (*)    HCT 34.9 (*)    All other components within normal limits  BASIC METABOLIC PANEL - Abnormal; Notable for the following:    Potassium 5.6 (*)    Glucose, Bld 208 (*)    BUN 33 (*)    Creatinine, Ser 2.01 (*)    GFR calc non Af Amer 35 (*)    GFR calc Af Amer 41 (*)    Anion gap 4 (*)    All other components within normal limits  TROPONIN I   ____________________________________________  EKG  ED ECG REPORT I, QUALE, MARK, the attending physician, personally viewed and interpreted this ECG.  Date: 09/08/2014 EKG Time: 1525 Rate: 60 Rhythm: normal sinus rhythm QRS Axis: normal Intervals: normal ST/T Wave abnormalities: normal except for slight T-wave depressions in lateral leads Conduction Disutrbances: none Narrative Interpretation: Normal sinus rhythm with slight T-wave abnormality seen in lateral leads  ____________________________________________  RADIOLOGY   ____________________________________________   PROCEDURES  Procedure(s)  performed: None  Critical Care performed: No  ____________________________________________   INITIAL IMPRESSION / ASSESSMENT AND PLAN / ED COURSE  Pertinent labs & imaging results that were available during my care of the patient were reviewed by me and considered in my medical decision making (see chart for details).  Patient clearly orthostatic even after hydration with a liter fluid in the ER. I am not sure why he is orthostatic, but he does not have any concerning acute cardiopulmonary symptoms and his exam is very reassuring. This may be medication related though he has held all his antihypertensives medicines for over 24 hours. Because of his persistent symptomatic orthostatic hypotension, we will admit in the hospital and give additional liter of fluid in the ER for hydration. I suspect this is likely due to an element of dehydration, but other etiologies are also considered.  Laboratory analysis shows an increasing creatinine, this could be prerenal in etiology and will continue to hydrate him and admit to the hospitalist service for ongoing workup. ____________________________________________   FINAL CLINICAL IMPRESSION(S) / ED DIAGNOSES  Final diagnoses:  Dehydration  Orthostatic hypotension      Sharyn Creamer, MD 09/08/14 [phone]

## 2014-09-09 DIAGNOSIS — I255 Ischemic cardiomyopathy: Secondary | ICD-10-CM | POA: Diagnosis not present

## 2014-09-09 DIAGNOSIS — I2510 Atherosclerotic heart disease of native coronary artery without angina pectoris: Secondary | ICD-10-CM

## 2014-09-09 DIAGNOSIS — E860 Dehydration: Secondary | ICD-10-CM | POA: Insufficient documentation

## 2014-09-09 DIAGNOSIS — I951 Orthostatic hypotension: Secondary | ICD-10-CM | POA: Diagnosis not present

## 2014-09-09 DIAGNOSIS — R42 Dizziness and giddiness: Secondary | ICD-10-CM

## 2014-09-09 LAB — BASIC METABOLIC PANEL
ANION GAP: 6 (ref 5–15)
BUN: 29 mg/dL — ABNORMAL HIGH (ref 6–20)
CHLORIDE: 111 mmol/L (ref 101–111)
CO2: 24 mmol/L (ref 22–32)
CREATININE: 1.69 mg/dL — AB (ref 0.61–1.24)
Calcium: 9 mg/dL (ref 8.9–10.3)
GFR calc non Af Amer: 44 mL/min — ABNORMAL LOW (ref >60)
GFR, EST AFRICAN AMERICAN: 51 mL/min — AB (ref >60)
Glucose, Bld: 187 mg/dL — ABNORMAL HIGH (ref 65–99)
POTASSIUM: 4.3 mmol/L (ref 3.5–5.1)
Sodium: 141 mmol/L (ref 135–145)

## 2014-09-09 LAB — CBC
HCT: 31.8 % — ABNORMAL LOW (ref 40.0–52.0)
Hemoglobin: 10.6 g/dL — ABNORMAL LOW (ref 13.0–18.0)
MCH: 29.2 pg (ref 26.0–34.0)
MCHC: 33.3 g/dL (ref 32.0–36.0)
MCV: 87.6 fL (ref 80.0–100.0)
PLATELETS: 125 10*3/uL — AB (ref 150–440)
RBC: 3.63 MIL/uL — ABNORMAL LOW (ref 4.40–5.90)
RDW: 14.2 % (ref 11.5–14.5)
WBC: 4.7 10*3/uL (ref 3.8–10.6)

## 2014-09-09 LAB — LIPID PANEL
CHOL/HDL RATIO: 4 ratio
CHOLESTEROL: 93 mg/dL (ref 0–200)
HDL: 23 mg/dL — ABNORMAL LOW (ref >40)
LDL Cholesterol: 26 mg/dL (ref 0–99)
Triglycerides: 220 mg/dL — ABNORMAL HIGH (ref <150)
VLDL: 44 mg/dL — ABNORMAL HIGH (ref 0–40)

## 2014-09-09 LAB — GLUCOSE, CAPILLARY: GLUCOSE-CAPILLARY: 172 mg/dL — AB (ref 65–99)

## 2014-09-09 LAB — TROPONIN I: Troponin I: <0.03 ng/mL (ref <0.031)

## 2014-09-09 NOTE — Progress Notes (Signed)
[facility] [place] [facility]   PATIENT NAME: Angel Cruz    MR#:  [id number]  DATE OF BIRTH:  03/19/1955  SUBJECTIVE:  CHIEF COMPLAINT:   Chief Complaint  Patient presents with  . Hypotension   Patient presented to the hospital with hypotension and syncope. Feels a little bit dizzy when standing up. Orthostatic vital signs are still positive.  REVIEW OF SYSTEMS:    Review of Systems  Constitutional: Negative for fever and chills.  HENT: Negative for congestion and tinnitus.   Eyes: Negative for blurred vision and double vision.  Respiratory: Negative for cough, shortness of breath and wheezing.   Cardiovascular: Negative for chest pain, orthopnea and PND.  Gastrointestinal: Negative for nausea, vomiting, abdominal pain and diarrhea.  Genitourinary: Negative for dysuria and hematuria.  Neurological: Positive for dizziness (when standing up). Negative for sensory change and focal weakness.  All other systems reviewed and are negative.   Nutrition: Heart healthy Tolerating Diet: Yes Tolerating PT: Seen by PT and they recommend no services.   DRUG ALLERGIES:  No Known Allergies  VITALS:  Blood pressure 129/72, pulse 57, temperature 97.9 F (36.6 C), temperature source Oral, resp. rate 21, height 5\' 7"  (1.702 m), weight 54.205 kg (119 lb 8 oz), SpO2 100 %.  PHYSICAL EXAMINATION:   Physical Exam  GENERAL:  59 y.o.-year-old patient lying in the bed with no acute distress.  EYES: Pupils equal, round, reactive to light and accommodation. No scleral icterus. Extraocular muscles intact.  HEENT: Head atraumatic, normocephalic. Oropharynx and nasopharynx clear.  NECK:  Supple, no jugular venous distention. No thyroid enlargement, no tenderness.  LUNGS: Normal breath sounds bilaterally, no wheezing, rales, rhonchi. No use of accessory muscles of respiration.  CARDIOVASCULAR: S1, S2 RRR. No murmurs, rubs, or gallops.  ABDOMEN: Soft, nontender,  nondistended. Bowel sounds present. No organomegaly or mass.  EXTREMITIES: No cyanosis, clubbing or edema b/l.    NEUROLOGIC: Cranial nerves II through XII are intact. No focal Motor or sensory deficits b/l.   PSYCHIATRIC: The patient is alert and oriented x 3. Good affect SKIN: No obvious rash, lesion, or ulcer.    LABORATORY PANEL:   CBC  Recent Labs Lab 09/09/14 0225  WBC 4.7  HGB 10.6*  HCT 31.8*  PLT 125*   ------------------------------------------------------------------------------------------------------------------  Chemistries   Recent Labs Lab 09/09/14 0225  NA 141  K 4.3  CL 111  CO2 24  GLUCOSE 187*  BUN 29*  CREATININE 1.69*  CALCIUM 9.0   ------------------------------------------------------------------------------------------------------------------  Cardiac Enzymes  Recent Labs Lab 09/09/14 0824  TROPONINI <0.03   ------------------------------------------------------------------------------------------------------------------  RADIOLOGY:  No results found.   ASSESSMENT AND PLAN:   59 year old male with past medical history of ischemic cardiomyopathy ejection fraction 35-40%, history of chronic systolic CHF, type 2 diabetes without consultation, hypertension, hyperlipidemia, depression, obstructive sleep apnea, chronic kidney disease stage III who presented to the hospital due to syncope and hypotension.  #1 orthostatic hypotension-this was likely the cause of patient's dizziness/syncope. -Patient still continues to have positive orthostatic vital signs. We'll continue to hold Coreg, Entresto for now. -Continue gentle IV fluids and repeat orthostatic vital signs in a.m. -As per cardiology may need to start the patient on low-dose Midodrine to help with this.   #2 acute on chronic renal failure-this is likely secondary to hypotension and ATN. -Continue gentle IV fluids and patient's BUN and creatinine are improving and we'll continue to  monitor.  #3 history of chronic systolic CHF-clinically patient is not  in congestive heart failure. We will watch for symptoms as patient is getting IV fluids.  #4 history of depression-continue Lexapro.  #5 restless leg syndrome-continue Requip.  #6 hyperlipidemia-continue Crestor.  #7 diabetes without complication-continue insulin as scheduled.    All the records are reviewed and case discussed with Care Management/Social Workerr. Management plans discussed with the patient, family and they are in agreement.  CODE STATUS: Full  DVT Prophylaxis: Heparin subcutaneous  TOTAL TIME TAKING CARE OF THIS PATIENT: 30 minutes.   POSSIBLE D/C IN 1-2 DAYS, DEPENDING ON CLINICAL CONDITION.   Houston Siren M.D on 09/09/2014 at 12:40 PM  Between 7am to 6pm - Pager - [phone]  After 6pm go to [email or web address] - password [email or web address] [email or web address]  [facility]  Office  [phone]  CC: Primary care physician; Ruel Favors, MD

## 2014-09-09 NOTE — Consult Note (Addendum)
Cardiology Consultation Note  Patient ID: Altair Appenzeller, MRN: [id number], DOB/AGE: 57-Jan-1957 57 y.o. Admit date: 09/08/2014   Date of Consult: 09/09/2014 Primary Physician: Ruel Favors, MD Primary Cardiologist: Dr. Kirke Corin, MD  Chief Complaint: Dizziness  Reason for Consult: Orthostatic hypotension   HPI: 59 year old male with history of CAD managed medically, ischemic cardiomyopathy, chronic systolic CHF, labile HTN, CKD stage III, HLD, DM2, morbid obesity, OSA, and depression who was recently seen in the [facility] ED on 7/28 for orthostasis and hyperkalemia and in clinic on 7/29 for the same [facility] for dizziness and orthostasis. Cardiology is consulted for further evaluation.   He was admitted to [facility] in September, 2015 with complaints of cough and chest pain.He ruled out for myocardial infarction.He underwent stress testing which revealed apical infarct with peri-infarct ischemia.EF was 31%. Echocardiogram showed EF 35-40% with anteroseptal, anterior, apical wall motion abnormalities. He underwent cardiac catheterization which revealed an occluded mid LAD with otherwise nonobstructive CAD.There were faint right to left collaterals supplying the distal LAD distribution. He has had recurrent problems with labile hypertension as well as episodes of fluid overload alternating with volume depletion whenever he is on a diuretic. He has had continued episodes of dizziness and low blood pressure with episodic vitis to the emergency room for orthostatic hypotension. Generally his basic work up is negative. He most recently presented to the ED on 7/28 for orthostasis and was found to be hyperkalemic with a K+ of 5.6, with a prior history of hyperkalemia. He was treated in the ED with IV fluids.  He has been using a powered cocktail for his water and there is some uncertainty if this is playing a role in his elevated potassium. He followed up in the office on 7/29  feeling somewhat dizzy and blood pressures running in the 90s/60s. He had recently been having several days of diarrhea of uncertain etiology. His Coreg was decreased from 6.25 bid to 3.125 mg bid.   He presented to [facility] again on 7/30 with complaints of ongoing orthostasis for the past 2 weeks.   Upon his arrival his BP was found to be 85/46. IV fluids were started. Troponin is negative x 3. SCr 1.79-->2.01-->1.69, BUN 30-->33-->29. Coreg and Entresto were held. He currently states he is feeling better. He has ambulated in his room since starting IV fluid replacement with no symptoms. He is scheduled to have orthostatics prior to lunch today.     Past Medical History  Diagnosis Date  . Diabetes mellitus without complication   . Hypertension   . Hyperlipidemia   . Depression   . Rotator cuff tear   . CKD (chronic kidney disease), stage III   . Ischemic cardiomyopathy     a. 10/2013 Echo: EF 35-40%, severe distal anteroseptal, anterior, and apical HK. Diast dysfxn, mild conc LVH, mildly dil LA, mild Ao sclerosis w/o stenosis.  Marland Kitchen CAD (coronary artery disease)     a. 10/2013 Lexi MV: EF 31%, apical, septal, ant-apical, inf-apical, lat-apical scar, septal and apical peri-infarct ischemia;  b. 10/2013 Cath: LM nl, LAD 20ost, 135m (faint R->L collats), D1 80p, LCX min irregs, OM1 min irregs, OM2 nl, OM3 30p, RCA 20p, PDA 60, RPL min irregs-->Med Rx.  . Morbid obesity   . CHF (congestive heart failure)   . Depression   . OSA (obstructive sleep apnea)   . Sleep apnea     recent test confirmed diagnosis, was performed at the sleep center  across the street      Most Recent Cardiac Studies: Myoview 10/2013:   Imaging quality poor, myocardial perfusion scan showed evidence of pathology and had an abnormal appearance. GI uptake was noted. EF 31%. No EKG changes. High risk scan.   Echo 10/2013:   EF 35-40%, severe distal anteroseptal, anterior and apical HK, impaired relaxation, mild concentric LVH,  mildly dilated LA, mild aortic sclerosis without stenosis.   Cardiac cath 10/2013:   Ostial LAD 20% stenosis, mid LAD 100% stenosis, D1 80% stenosis, OM3 30% stenosis, prox RCA 20%, RPDA 60% stenosis    Surgical History:  Past Surgical History  Procedure Laterality Date  . Cardiac catheterization  10/19/2013    [facility]  . Uvulectomy  1995  . Atrial fibrillation ablation  1995  . Elbow surgery Left   . Rotator cuff repair    . Carpal tunnel release       Home Meds: Prior to Admission medications   Medication Sig Start Date End Date Taking? Authorizing Provider  aspirin EC 81 MG tablet Take 81 mg by mouth daily.   Yes Historical Provider, MD  carvedilol (COREG) 3.125 MG tablet Take 1 tablet (3.125 mg total) by mouth 2 (two) times daily. 09/07/14  Yes Antonieta Iba, MD  Cyanocobalamin (VITAMIN B-12 PO) Take 1 tablet by mouth daily.   Yes Historical Provider, MD  escitalopram (LEXAPRO) 10 MG tablet Take 1 tablet (10 mg total) by mouth daily. 08/06/14  Yes Alba Cory, MD  Insulin Degludec (TRESIBA FLEXTOUCH) 200 UNIT/ML SOPN Inject 10 Units into the skin daily. 08/06/14  Yes Alba Cory, MD  metFORMIN (GLUCOPHAGE) 850 MG tablet Take 850 mg by mouth 2 (two) times daily with a meal.   Yes Historical Provider, MD  nitroGLYCERIN (NITROSTAT) 0.4 MG SL tablet Place 0.4 mg under the tongue every 5 (five) minutes as needed for chest pain.   Yes Historical Provider, MD  QUEtiapine (SEROQUEL) 100 MG tablet Take 100 mg by mouth at bedtime.   Yes Historical Provider, MD  rOPINIRole (REQUIP) 0.5 MG tablet Take 1 tablet (0.5 mg total) by mouth at bedtime. 07/30/14  Yes Alba Cory, MD  rosuvastatin (CRESTOR) 20 MG tablet Take 20 mg by mouth at bedtime.    Yes Historical Provider, MD  sacubitril-valsartan (ENTRESTO) 24-26 MG Take 1 tablet by mouth 2 (two) times daily. 05/16/14  Yes Iran Ouch, MD    Inpatient Medications:  . aspirin EC  81 mg Oral Daily  . escitalopram  10 mg Oral Daily  .  heparin  5,000 Units Subcutaneous 3 times per day  . Insulin Degludec  10 Units Subcutaneous Daily  . QUEtiapine  100 mg Oral QHS  . rOPINIRole  0.5 mg Oral QHS  . rosuvastatin  20 mg Oral QHS  . vitamin B-12  100 mcg Oral Daily   . sodium chloride 75 mL/hr at 09/08/14 1959    Allergies: No Known Allergies  History   Social History  . Marital Status: Married    Spouse Name: Angelique Blonder  . Number of Children: 2  . Years of Education: N/A   Occupational History  . Not on file.   Social History Main Topics  . Smoking status: Never Smoker   . Smokeless tobacco: Not on file  . Alcohol Use: No  . Drug Use: No  . Sexual Activity: No   Other Topics Concern  . Not on file   Social History Narrative     Family History  Problem  Relation Age of Onset  . Stroke Mother   . COPD Mother   . Heart failure Mother   . Heart attack Mother   . Colon cancer Father   . Diabetes Father   . Diabetes Brother   . Cancer Brother      Review of Systems: Review of Systems  Constitutional: Positive for weight loss and malaise/fatigue. Negative for fever, chills and diaphoresis.  HENT: Negative for congestion.   Eyes: Negative for blurred vision, discharge and redness.  Respiratory: Negative.  Negative for cough, hemoptysis, sputum production, shortness of breath and wheezing.   Cardiovascular: Positive for chest pain. Negative for palpitations, orthopnea, claudication, leg swelling and PND.       Chest pain resolved  Gastrointestinal: Negative.  Negative for heartburn, nausea, vomiting and abdominal pain.  Musculoskeletal: Negative.  Negative for myalgias and falls.  Skin: Negative for rash.  Neurological: Positive for dizziness and weakness. Negative for sensory change, speech change, focal weakness and headaches.  Psychiatric/Behavioral: Negative.  The patient is not nervous/anxious.   All other systems reviewed and are negative.    Labs:  Recent Labs  09/08/14 1514 09/08/14 2143  09/09/14 0236 09/09/14 0824  TROPONINI <0.03 <0.03 <0.03 <0.03   Lab Results  Component Value Date   WBC 4.7 09/09/2014   HGB 10.6* 09/09/2014   HCT 31.8* 09/09/2014   MCV 87.6 09/09/2014   PLT 125* 09/09/2014    Recent Labs Lab 09/09/14 0225  NA 141  K 4.3  CL 111  CO2 24  BUN 29*  CREATININE 1.69*  CALCIUM 9.0  GLUCOSE 187*   Lab Results  Component Value Date   CHOL 93 09/09/2014   HDL 23* 09/09/2014   LDLCALC 26 09/09/2014   TRIG 220* 09/09/2014   No results found for: DDIMER  Radiology/Studies:  Dg Chest 2 View  08/15/2014   CLINICAL DATA:  Chest pain  EXAM: CHEST  2 VIEW  COMPARISON:  02/11/2014  FINDINGS: The heart size and mediastinal contours are within normal limits. Both lungs are clear. The visualized skeletal structures are unremarkable.  IMPRESSION: No active cardiopulmonary disease.   Electronically Signed   By: Marnee Spring M.D.   On: 08/15/2014 22:16    EKG: NSR, 63 bpm, TWI leads I, aVL, V2,   Weights: Filed Weights   09/08/14 1504 09/09/14 0419  Weight: 214 lb (97.07 kg) 119 lb 8 oz (54.205 kg)     Physical Exam: Blood pressure 129/72, pulse 58, temperature 97.6 F (36.4 C), temperature source Oral, resp. rate 16, height 5\' 7"  (1.702 m), weight 119 lb 8 oz (54.205 kg), SpO2 98 %. Body mass index is 18.71 kg/(m^2). General: Well developed, well nourished, in no acute distress. Head: Normocephalic, atraumatic, sclera non-icteric, no xanthomas, nares are without discharge.  Neck: Negative for carotid bruits. JVD not elevated. Lungs: Clear bilaterally to auscultation without wheezes, rales, or rhonchi. Breathing is unlabored. Heart: RRR with S1 S2. No murmurs, rubs, or gallops appreciated. Abdomen: Soft, non-tender, non-distended with normoactive bowel sounds. No hepatomegaly. No rebound/guarding. No obvious abdominal masses. Msk:  Strength and tone appear normal for age. Extremities: No clubbing or cyanosis. No edema.  Distal pedal pulses are  2+ and equal bilaterally. Neuro: Alert and oriented X 3. No facial asymmetry. No focal deficit. Moves all extremities spontaneously. Psych:  Responds to questions appropriately with a normal affect.    Assessment and Plan:  59 year old male with history of CAD managed medically, ischemic cardiomyopathy, chronic systolic  CHF, labile HTN, CKD stage III, HLD, DM2, morbid obesity, OSA, and depression who was recently seen in the [facility] ED on 7/28 for orthostasis and hyperkalemia and in clinic on 7/29 for the same presented to [facility] on 7/30 for dizziness and orthostasis. Cardiology is consulted for further evaluation.  1. Orhtostatic hypotension: -Presented with elevated SCr and BUn, suggesting he was somewhat dehydrated  -Recent vitals suggest correction of BP -Would check othostatics at this time to verify -Consider adding midodrine to aid in BP support  -Will need to slowly add back in his heart failure medications  -Could start with low dose Coreg, followed by Entresto if BP allows vs ACei if BP does not allow  2. Chest pain/CAD with recent cardiac cath as above: -None currently -Likely in the setting of his hypotension -Recent cardiac cath as above. No further ischemic evaluation at this time  3. Hyperkalemia: -This is the second time this week he has presented with hyperkalemia -Of uncertain etiology. Perhaps this is related to his dehydration vs his powdered substance he is applying to his water. Would suggest he no longer uses this powder and drink regular water   4. Ischemic cardiomyopthy/chronic systolic CHF: -Lab work suggests dehydration as above -Currently not on heart failure medications secondary to #1, will need to slowly add back pending BP  5. Acute on CKD stage III: -Presented with dehydration -Improved with IV fluids   Signed, Eula Listen, PA-C Pager: [phone] 09/09/2014, 9:22 AM    Attending Note Patient seen and examined, agree with detailed note above,   Patient presentation and plan discussed on rounds.   Recently seen in clinic for hypotension (follow-up after visit to the emergency room for hyperkalemia and lightheadedness). Renal function continues to run high, above his baseline concerning for hypovolemia. Elevated potassium. Unclear if this is secondary to dietary. He does add a citrus packet to his water. He reports drinking 4-5 glasses of water per day otherwise no other drinks.  Carvedilol has slowly been weaned over the past several months, was recommended he hold his medications for low blood pressure on his last clinic visit. He reports that he is been holding his medications in the past 24 hours.  --If no improvement in his blood pressure/orthostasis with IV fluids, will need to add midodrine 5-10 mg 3 times a day for blood pressure support --We'll hold Coreg and entresto for now Would recheck BMP tomorrow after fluids   Signed: Dossie Arbour  M.D., Ph.D. [facility]

## 2014-09-09 NOTE — Progress Notes (Signed)
Physical Therapy Evaluation Patient Details Name: Angel Cruz MRN: [id number] DOB: 1956-01-25 Today's Date: 09/09/2014   History of Present Illness  Patient is a 59 y.o. male admitted for dizziness/low blood pressure. Patient has PMH of ischemic cardiomyopathy with an EF 35-40%, CHF, diverticulitis, HTN, hyperlipidemia, CKD III, and Diabetes.  Clinical Impression  Patient demonstrated his baseline level of mobility on evaluation with no complaints of dizziness throughout evaluation. Patient's BP was 169/80 in supine. BP dropped to 143/84 in sitting, raising to 157/83 after one minute. In standing BP was 114/76 and stayed the same upon reassessment one minute later. Post ambulation BP was 132/79. Patient is deemed safe to return home when his BP stabilizes. No further PT is necessary.    Follow Up Recommendations No PT follow up    Equipment Recommendations  None recommended by PT    Recommendations for Other Services       Precautions / Restrictions Precautions Precautions: Fall Restrictions Weight Bearing Restrictions: No      Mobility  Bed Mobility Overal bed mobility: Independent             General bed mobility comments: Patient demonstrated good safety awareness in bed mobility.  Transfers Overall transfer level: Modified independent Equipment used: Rolling walker (2 wheeled)             General transfer comment: Patient demonstrates good safety awareness. Able to stand with no UE support. BP supine 169/80, sit 143/84-157/83, stand 114/76-114/76; 132/79 after ambulation  Ambulation/Gait Ambulation/Gait assistance: Modified independent (Device/Increase time) Ambulation Distance (Feet): 200 Feet Assistive device: Rolling walker (2 wheeled) Gait Pattern/deviations: WFL(Within Functional Limits)     General Gait Details: Patient ambulates with no difficulties. RW utilized in case patient experienced light headedness but was not necessary.  Stairs            Wheelchair Mobility    Modified Rankin (Stroke Patients Only)       Balance Overall balance assessment: Independent                                           Pertinent Vitals/Pain Pain Assessment: No/denies pain    Home Living Family/patient expects to be discharged to:: Private residence Living Arrangements: Spouse/significant other;Children (Wife is a Runner, broadcasting/film/video; son works) Available Help at Discharge: Family;Other (Comment);Available PRN/intermittently (Wife, Father) Type of Home: House Home Access: Level entry     Home Layout: One level Home Equipment: None      Prior Function Level of Independence: Needs assistance   Gait / Transfers Assistance Needed: None  ADL's / Homemaking Assistance Needed: Patient is a household ambulator; occasionally helps with chores (dishes)        Hand Dominance        Extremity/Trunk Assessment   Upper Extremity Assessment: Overall WFL for tasks assessed (Slight visible atrophy L>R)           Lower Extremity Assessment: Overall WFL for tasks assessed (Slight visible atrophy L>R)         Communication   Communication: No difficulties  Cognition Arousal/Alertness: Awake/alert Behavior During Therapy: WFL for tasks assessed/performed Overall Cognitive Status: Within Functional Limits for tasks assessed                      General Comments      Exercises        Assessment/Plan  PT Assessment Patent does not need any further PT services  PT Diagnosis Difficulty walking   PT Problem List    PT Treatment Interventions     PT Goals (Current goals can be found in the Care Plan section) Acute Rehab PT Goals Patient Stated Goal: "To go home with a normal BP" PT Goal Formulation: With patient/family Time For Goal Achievement: 09/23/14 Potential to Achieve Goals: Good    Frequency     Barriers to discharge        Co-evaluation               End of Session Equipment  Utilized During Treatment: Gait belt Activity Tolerance: Patient tolerated treatment well Patient left: in chair;with call bell/phone within reach;with chair alarm set Nurse Communication: Mobility status    Functional Limitation: Mobility: Walking and moving around Mobility: Walking and Moving Around Current Status [phone]): At least 1 percent but less than 20 percent impaired, limited or restricted Mobility: Walking and Moving Around Goal Status [phone]): 0 percent impaired, limited or restricted    Time: 1030-1100 PT Time Calculation (min) (ACUTE ONLY): 30 min   Charges:   PT Evaluation $Initial PT Evaluation Tier I: 1 Procedure     PT G Codes:   PT G-Codes **NOT FOR INPATIENT CLASS** Functional Limitation: Mobility: Walking and moving around Mobility: Walking and Moving Around Current Status (U9811): At least 1 percent but less than 20 percent impaired, limited or restricted Mobility: Walking and Moving Around Goal Status [phone]): 0 percent impaired, limited or restricted    Neita Carp, PT, DPT 09/09/2014, 11:56 AM

## 2014-09-10 ENCOUNTER — Telehealth: Payer: Self-pay | Admitting: Family Medicine

## 2014-09-10 DIAGNOSIS — I2510 Atherosclerotic heart disease of native coronary artery without angina pectoris: Secondary | ICD-10-CM | POA: Diagnosis not present

## 2014-09-10 DIAGNOSIS — I255 Ischemic cardiomyopathy: Secondary | ICD-10-CM | POA: Diagnosis not present

## 2014-09-10 DIAGNOSIS — R42 Dizziness and giddiness: Secondary | ICD-10-CM | POA: Diagnosis not present

## 2014-09-10 DIAGNOSIS — E860 Dehydration: Secondary | ICD-10-CM | POA: Diagnosis not present

## 2014-09-10 LAB — BASIC METABOLIC PANEL
ANION GAP: 5 (ref 5–15)
BUN: 24 mg/dL — ABNORMAL HIGH (ref 6–20)
CO2: 24 mmol/L (ref 22–32)
Calcium: 8.7 mg/dL — ABNORMAL LOW (ref 8.9–10.3)
Chloride: 111 mmol/L (ref 101–111)
Creatinine, Ser: 1.3 mg/dL — ABNORMAL HIGH (ref 0.61–1.24)
GFR calc Af Amer: >60 mL/min (ref >60)
GFR calc non Af Amer: 60 mL/min — ABNORMAL LOW (ref >60)
Glucose, Bld: 179 mg/dL — ABNORMAL HIGH (ref 65–99)
POTASSIUM: 4.3 mmol/L (ref 3.5–5.1)
Sodium: 140 mmol/L (ref 135–145)

## 2014-09-10 LAB — GLUCOSE, CAPILLARY: Glucose-Capillary: 182 mg/dL — ABNORMAL HIGH (ref 65–99)

## 2014-09-10 MED ORDER — MIDODRINE HCL 5 MG PO TABS
5.0000 mg | ORAL_TABLET | Freq: Two times a day (BID) | ORAL | Status: DC
  Administered 2014-09-10: 5 mg via ORAL
  Filled 2014-09-10: qty 1

## 2014-09-10 MED ORDER — MIDODRINE HCL 5 MG PO TABS: 5.0000 mg | ORAL_TABLET | Freq: Two times a day (BID) | ORAL | Status: DC

## 2014-09-10 MED ORDER — MIDODRINE HCL 5 MG PO TABS
5.0000 mg | ORAL_TABLET | Freq: Two times a day (BID) | ORAL | Status: DC
  Administered 2014-09-11: 5 mg via ORAL
  Filled 2014-09-10: qty 1

## 2014-09-10 NOTE — Progress Notes (Addendum)
Patient: Angel Cruz / Admit Date: 09/08/2014 / Date of Encounter: 09/10/2014, 6:31 PM   Subjective: Continued orthostasis earlier today, symptomatic with systolic pressures into the 80s with standing. He was started on midodrine with dose given at 1:00 pm. significant improvement in orthostasis on blood pressures at 4:30  pm.  systolic pressure with standing 108    Review of Systems: ROS Constitutional: Positive for weight loss and malaise/fatigue. Negative for fever, chills and diaphoresis.  HENT: Negative for congestion.  Eyes: Negative for blurred vision, discharge and redness.  Respiratory: Negative. Negative for cough, hemoptysis, sputum production, shortness of breath and wheezing.  Cardiovascular: Positive for chest pain. Negative for palpitations, orthopnea, claudication, leg swelling and PND.   Chest pain resolved  Gastrointestinal: Negative. Negative for heartburn, nausea, vomiting and abdominal pain.  Musculoskeletal: Negative. Negative for myalgias and falls.  Skin: Negative for rash.  Neurological: Positive for dizziness and weakness. Negative for sensory change, speech change, focal weakness and headaches.  Psychiatric/Behavioral: Negative. The patient is not nervous/anxious.  All other systems reviewed and are negative.  Objective: Telemetry: Normal sinus rhythm with rate in the 60s  Physical Exam: Blood pressure 143/79, pulse 65, temperature 98.1 F (36.7 C), temperature source Oral, resp. rate 18, height 5\' 7"  (1.702 m), weight 119 lb 8 oz (54.205 kg), SpO2 98 %. Body mass index is 18.71 kg/(m^2). General: Well developed, well nourished, in no acute distress. Head: Normocephalic, atraumatic, sclera non-icteric, no xanthomas, nares are without discharge. Neck: Negative for carotid bruits. JVP not elevated. Lungs: Clear bilaterally to auscultation without wheezes, rales, or rhonchi. Breathing is unlabored. Heart: RRR S1 S2 without murmurs, rubs, or  gallops.  Abdomen: Soft, non-tender, non-distended with normoactive bowel sounds. No rebound/guarding. Extremities: No clubbing or cyanosis. No edema. Distal pedal pulses are 2+ and equal bilaterally. Neuro: Alert and oriented X 3. Moves all extremities spontaneously. Psych:  Responds to questions appropriately with a normal affect.   Intake/Output Summary (Last 24 hours) at 09/10/14 1831 Last data filed at 09/10/14 1831  Gross per 24 hour  Intake    720 ml  Output    950 ml  Net   -230 ml    Inpatient Medications:  . aspirin EC  81 mg Oral Daily  . escitalopram  10 mg Oral Daily  . heparin  5,000 Units Subcutaneous 3 times per day  . Insulin Degludec  10 Units Subcutaneous Daily  . midodrine  5 mg Oral BID WC  . QUEtiapine  100 mg Oral QHS  . rOPINIRole  0.5 mg Oral QHS  . rosuvastatin  20 mg Oral QHS  . vitamin B-12  100 mcg Oral Daily   Infusions:    Labs:  Recent Labs  09/09/14 0225 09/10/14 0416  NA 141 140  K 4.3 4.3  CL 111 111  CO2 24 24  GLUCOSE 187* 179*  BUN 29* 24*  CREATININE 1.69* 1.30*  CALCIUM 9.0 8.7*   No results for input(s): AST, ALT, ALKPHOS, BILITOT, PROT, ALBUMIN in the last 72 hours.  Recent Labs  09/08/14 1514 09/09/14 0225  WBC 5.1 4.7  HGB 11.4* 10.6*  HCT 34.9* 31.8*  MCV 88.4 87.6  PLT 156 125*    Recent Labs  09/08/14 1514 09/08/14 2143 09/09/14 0236 09/09/14 0824  TROPONINI <0.03 <0.03 <0.03 <0.03   Invalid input(s): POCBNP No results for input(s): HGBA1C in the last 72 hours.   Weights: Filed Weights   09/08/14 1504 09/09/14 0419  Weight: 214 lb (  97.07 kg) 119 lb 8 oz (54.205 kg)     Radiology/Studies:  Dg Chest 2 View  08/15/2014   CLINICAL DATA:  Chest pain  EXAM: CHEST  2 VIEW  COMPARISON:  02/11/2014  FINDINGS: The heart size and mediastinal contours are within normal limits. Both lungs are clear. The visualized skeletal structures are unremarkable.  IMPRESSION: No active cardiopulmonary disease.    Electronically Signed   By: Marnee Spring M.D.   On: 08/15/2014 22:16     Assessment and Plan  59 year old male with history of CAD managed medically, ischemic cardiomyopathy, chronic systolic CHF, labile HTN, CKD stage III, HLD, DM2, morbid obesity, OSA, and depression who was recently seen in the [facility] ED on 7/28 for orthostasis and hyperkalemia and in clinic on 7/29 for the same presented to [facility] on 7/30 for dizziness and orthostasis. Cardiology is consulted for further evaluation.  1. Orhtostatic hypotension: Persistent symptoms despite fluid hydration, holding his Coreg, and Entresto Midodrine 5 mg given this afternoon with improvement of his orthostasis Would plan on giving midodrine 5 mg at 7 AM, 1 PM If needed for symptoms could give additional 5 mg when necessary. Provide prescription at discharge for 10 mg dose to allow titration as an outpatient  2. Chest pain/CAD with recent cardiac cath as above: Faith chest pain symptoms this afternoon, atypical in nature -Recent cardiac cath as above. No further ischemic evaluation at this time  3. Hyperkalemia: Resolved, 4.3  4. Ischemic cardiomyopthy/chronic systolic CHF: We'll hold carvedilol and entresto for now. These could be added back very slowly in the setting of midodrine as an outpatient  5. Acute on CKD stage III: -Improved with IV fluids   Signed: Dossie Arbour M.D., Ph.D. [facility] 09/10/2014, 6:31 PM

## 2014-09-10 NOTE — Care Management (Addendum)
It is reported during progression that patient will discharge home today, but patient orthostatics are positive and patient symptomatic.

## 2014-09-10 NOTE — Plan of Care (Signed)
Problem: Phase III Progression Outcomes Goal: IV/normal saline lock discontinued Outcome: Completed/Met Date Met:  09/10/14 Pt is alert and oriented x 4, pt denies pain, up to bathroom with stand by assist, orthostatic vitals positive, MD aware, pt started on midodrine, orthostatic vitals improved with midodrine, good appetite, on room air, IV fluids d/c, Dr. Rockey Situ following patient, pt will likely d/c to home on 8/2, wife at bedside, uneventful shift.

## 2014-09-10 NOTE — Discharge Instructions (Signed)

## 2014-09-10 NOTE — Progress Notes (Signed)
Initial Nutrition Assessment    INTERVENTION:  1) Meals and Snacks: Cater to patient preferences 2) Education: provided verbal/written education on diabetic, heart healthy diet to pt and his wife. Focused mostly on carb counting, label reading and meal planning as this is where they had the most questions. Receptive to education, adhearance likely   NUTRITION DIAGNOSIS:   Food and nutrition related knowledge deficit related to chronic illness as evidenced by  (pt and wife with questions regarding diet).  GOAL:   Patient will meet greater than or equal to 90% of their needs  MONITOR:    (Energy Intake, Anthropometrics, Electrolyte/Renal Profile, Glucose PRofile, Knowledge)  REASON FOR ASSESSMENT:   Consult Assessment of nutrition requirement/status  ASSESSMENT:    Pt admitted with dehydration, orthostatic hypotension  Past Medical History  Diagnosis Date  . Diabetes mellitus without complication   . Hypertension   . Hyperlipidemia   . Depression   . Rotator cuff tear   . CKD (chronic kidney disease), stage III   . Ischemic cardiomyopathy     a. 10/2013 Echo: EF 35-40%, severe distal anteroseptal, anterior, and apical HK. Diast dysfxn, mild conc LVH, mildly dil LA, mild Ao sclerosis w/o stenosis.  Marland Kitchen CAD (coronary artery disease)     a. 10/2013 Lexi MV: EF 31%, apical, septal, ant-apical, inf-apical, lat-apical scar, septal and apical peri-infarct ischemia;  b. 10/2013 Cath: LM nl, LAD 20ost, 136m (faint R->L collats), D1 80p, LCX min irregs, OM1 min irregs, OM2 nl, OM3 30p, RCA 20p, PDA 60, RPL min irregs-->Med Rx.  . Morbid obesity   . CHF (congestive heart failure)   . Depression   . OSA (obstructive sleep apnea)   . Sleep apnea     recent test confirmed diagnosis, was performed at the sleep center across the street     Diet Order:  Diet heart healthy/carb modified Room service appropriate?: Yes; Fluid consistency:: Thin Diet - low sodium heart healthy   Energy  Intake: pt eating 90-100% of meals  Food and nutrition related history: reports appetite has been down at home but still trying to eat good meals (ate salmon with 3 sides the meal prior to admission)  Skin:  Reviewed, no issues  Height:   Ht Readings from Last 1 Encounters:  09/08/14 5\' 7"  (1.702 m)    Weight: need new weight, 100 pound difference in weight from admission to the following day; pt reports wt has been relatively stable  Wt Readings from Last 1 Encounters:  09/09/14 119 lb 8 oz (54.205 kg)   Filed Weights   09/08/14 1504 09/09/14 0419  Weight: 214 lb (97.07 kg) 119 lb 8 oz (54.205 kg)    BMI:  Body mass index is 18.71 kg/(m^2).-unsure of accuracy of weight  LOW Care Level  [facility] MS, RD, LDN [phone] Pager

## 2014-09-10 NOTE — Progress Notes (Signed)
Notified Dr. Mariah Milling that patient's wife does not feel comfortable with pt being discharged with symptomatic orthostatic vitals and initiation of midodrine, Okay for patient to stay until tommorow, and check ortho vitals this evening to see how well midodrine is working. Per Dr. Mariah Milling, he will round on patient this evening.

## 2014-09-10 NOTE — Progress Notes (Signed)
Notified Dr. Carla Drape that pt does not feel comfortable being d/c due to feeling "light headed" when going to bathroom, Okay with Dr. Cherlynn Kaiser if patientstays another day but would like to know Dr. Windell Hummingbird opinion, Dr. Mariah Milling paged.

## 2014-09-10 NOTE — Progress Notes (Signed)
Notified Dr. Cherlynn Kaiser via telephone over orthostatic vitals igns, MD aware, MD will enter medication, would like patient to see cardiologist in 1 week

## 2014-09-10 NOTE — Progress Notes (Addendum)
Pt requesting his own insulin degludec, states he takes it at night time. MD Hower notified.  Insuline has not been administered today. Okay per Dr. To give it at this time.

## 2014-09-11 DIAGNOSIS — I255 Ischemic cardiomyopathy: Secondary | ICD-10-CM | POA: Diagnosis not present

## 2014-09-11 DIAGNOSIS — R42 Dizziness and giddiness: Secondary | ICD-10-CM | POA: Diagnosis not present

## 2014-09-11 DIAGNOSIS — E860 Dehydration: Secondary | ICD-10-CM | POA: Diagnosis not present

## 2014-09-11 DIAGNOSIS — I2510 Atherosclerotic heart disease of native coronary artery without angina pectoris: Secondary | ICD-10-CM | POA: Diagnosis not present

## 2014-09-11 MED ORDER — MIDODRINE HCL 10 MG PO TABS: 5.0000 mg | ORAL_TABLET | Freq: Three times a day (TID) | ORAL | Status: DC | PRN

## 2014-09-11 NOTE — Discharge Summary (Signed)
[facility] [place] at [facility]   PATIENT NAME: Angel Cruz    MR#:  [id number]  DATE OF BIRTH:  16-Sep-1955  DATE OF ADMISSION:  09/08/2014 ADMITTING PHYSICIAN: Ramonita Lab, MD  DATE OF DISCHARGE: 09/11/2014 11:17 AM  PRIMARY CARE PHYSICIAN: Ruel Favors, MD    ADMISSION DIAGNOSIS:  Orthostatic hypotension [I95.1] Dehydration [E86.0]  DISCHARGE DIAGNOSIS:  Active Problems:   Dizziness   Dehydration   Coronary artery disease involving native coronary artery of native heart without angina pectoris   Cardiomyopathy, ischemic   SECONDARY DIAGNOSIS:   Past Medical History  Diagnosis Date  . Diabetes mellitus without complication   . Hypertension   . Hyperlipidemia   . Depression   . Rotator cuff tear   . CKD (chronic kidney disease), stage III   . Ischemic cardiomyopathy     a. 10/2013 Echo: EF 35-40%, severe distal anteroseptal, anterior, and apical HK. Diast dysfxn, mild conc LVH, mildly dil LA, mild Ao sclerosis w/o stenosis.  Marland Kitchen CAD (coronary artery disease)     a. 10/2013 Lexi MV: EF 31%, apical, septal, ant-apical, inf-apical, lat-apical scar, septal and apical peri-infarct ischemia;  b. 10/2013 Cath: LM nl, LAD 20ost, 153m (faint R->L collats), D1 80p, LCX min irregs, OM1 min irregs, OM2 nl, OM3 30p, RCA 20p, PDA 60, RPL min irregs-->Med Rx.  . Morbid obesity   . CHF (congestive heart failure)   . Depression   . OSA (obstructive sleep apnea)   . Sleep apnea     recent test confirmed diagnosis, was performed at the sleep center across the street    HOSPITAL COURSE:   59 year old male with past medical history of ischemic cardiomyopathy ejection fraction 35-40%, history of chronic systolic CHF, type 2 diabetes without commplication, hypertension, hyperlipidemia, depression, obstructive sleep apnea, chronic kidney disease stage III who presented to the hospital due to syncope and hypotension.  #1 orthostatic hypotension-this was likely  the cause of patient's dizziness/syncope. -Patient was hydrated IV fluids. His antihypertensives were held. -He continued to be orthostatic despite getting IV fluids and therefore midodrine was added. -After being on Midodrine his orthostatic vital signs have improved. He has no further episodes of dizziness or syncope and therefore being discharged home. He will resume his Entresto but hold Coreg for now. -He will have close follow-up with his cardiologist as an outpatient.  #2 acute on chronic renal failure-this is likely secondary to hypotension and ATN. -Resolved with IV fluids  #3 history of chronic systolic CHF-clinically patient is not in congestive heart failure while in the hospital  #4 history of depression-continue Lexapro.  #5 restless leg syndrome-continue Requip.  #6 hyperlipidemia-continue Crestor.  #7 diabetes without complication-patient will resume his metformin and insulin upon discharge.  DISCHARGE CONDITIONS:   Stable  CONSULTS OBTAINED:  Treatment Team:  Ramonita Lab, MD Antonieta Iba, MD  DRUG ALLERGIES:  No Known Allergies  DISCHARGE MEDICATIONS:   Discharge Medication List as of 09/11/2014 10:54 AM    START taking these medications   Details  midodrine (PROAMATINE) 10 MG tablet Take 0.5 tablets (5 mg total) by mouth 3 (three) times daily as needed., Starting 09/11/2014, Until Discontinued, Print      CONTINUE these medications which have NOT CHANGED   Details  aspirin EC 81 MG tablet Take 81 mg by mouth daily., Until Discontinued, Historical Med    Cyanocobalamin (VITAMIN B-12 PO) Take 1 tablet by mouth daily., Until Discontinued, Historical Med    escitalopram (LEXAPRO)  10 MG tablet Take 1 tablet (10 mg total) by mouth daily., Starting 08/06/2014, Until Discontinued, Normal    Insulin Degludec (TRESIBA FLEXTOUCH) 200 UNIT/ML SOPN Inject 10 Units into the skin daily., Starting 08/06/2014, Until Discontinued, Normal    metFORMIN (GLUCOPHAGE) 850  MG tablet Take 850 mg by mouth 2 (two) times daily with a meal., Until Discontinued, Historical Med    nitroGLYCERIN (NITROSTAT) 0.4 MG SL tablet Place 0.4 mg under the tongue every 5 (five) minutes as needed for chest pain., Until Discontinued, Historical Med    QUEtiapine (SEROQUEL) 100 MG tablet Take 100 mg by mouth at bedtime., Until Discontinued, Historical Med    rOPINIRole (REQUIP) 0.5 MG tablet Take 1 tablet (0.5 mg total) by mouth at bedtime., Starting 07/30/2014, Until Discontinued, Normal    rosuvastatin (CRESTOR) 20 MG tablet Take 20 mg by mouth at bedtime. , Until Discontinued, Historical Med    sacubitril-valsartan (ENTRESTO) 24-26 MG Take 1 tablet by mouth 2 (two) times daily., Starting 05/16/2014, Until Discontinued, Print      STOP taking these medications     carvedilol (COREG) 3.125 MG tablet          DISCHARGE INSTRUCTIONS:   DIET:  Cardiac diet  DISCHARGE CONDITION:  Stable  ACTIVITY:  Activity as tolerated  OXYGEN:  Home Oxygen: No.   Oxygen Delivery: room air  DISCHARGE LOCATION:  home   If you experience worsening of your admission symptoms, develop shortness of breath, life threatening emergency, suicidal or homicidal thoughts you must seek medical attention immediately by calling 911 or calling your MD immediately  if symptoms less severe.  You Must read complete instructions/literature along with all the possible adverse reactions/side effects for all the Medicines you take and that have been prescribed to you. Take any new Medicines after you have completely understood and accpet all the possible adverse reactions/side effects.   Please note  You were cared for by a hospitalist during your hospital stay. If you have any questions about your discharge medications or the care you received while you were in the hospital after you are discharged, you can call the unit and asked to speak with the hospitalist on call if the hospitalist that took care  of you is not available. Once you are discharged, your primary care physician will handle any further medical issues. Please note that NO REFILLS for any discharge medications will be authorized once you are discharged, as it is imperative that you return to your primary care physician (or establish a relationship with a primary care physician if you do not have one) for your aftercare needs so that they can reassess your need for medications and monitor your lab values.     Today   Patient denies any dizziness, syncope overnight. Ambulating to the bathroom without any symptoms. No chest pain, nausea, vomiting, palpitations.  VITAL SIGNS:  Blood pressure 109/64, pulse 70, temperature 97.9 F (36.6 C), temperature source Oral, resp. rate 20, height 5\' 7"  (1.702 m), weight 54.205 kg (119 lb 8 oz), SpO2 100 %.  I/O:   Intake/Output Summary (Last 24 hours) at 09/11/14 1601 Last data filed at 09/11/14 0952  Gross per 24 hour  Intake    600 ml  Output      0 ml  Net    600 ml    PHYSICAL EXAMINATION:  GENERAL:  59 y.o.-year-old patient lying in the bed with no acute distress.  EYES: Pupils equal, round, reactive to light and accommodation. No  scleral icterus. Extraocular muscles intact.  HEENT: Head atraumatic, normocephalic. Oropharynx and nasopharynx clear.  NECK:  Supple, no jugular venous distention. No thyroid enlargement, no tenderness.  LUNGS: Normal breath sounds bilaterally, no wheezing, rales,rhonchi. No use of accessory muscles of respiration.  CARDIOVASCULAR: S1, S2 RRR. No murmurs, rubs, or gallops.  ABDOMEN: Soft, non-tender, non-distended. Bowel sounds present. No organomegaly or mass.  EXTREMITIES: No pedal edema, cyanosis, or clubbing.  NEUROLOGIC: Cranial nerves II through XII are intact. No focal motor or sensory defecits b/l.  PSYCHIATRIC: The patient is alert and oriented x 3. Good affect.  SKIN: No obvious rash, lesion, or ulcer.   DATA REVIEW:   CBC  Recent  Labs Lab 09/09/14 0225  WBC 4.7  HGB 10.6*  HCT 31.8*  PLT 125*    Chemistries   Recent Labs Lab 09/10/14 0416  NA 140  K 4.3  CL 111  CO2 24  GLUCOSE 179*  BUN 24*  CREATININE 1.30*  CALCIUM 8.7*    Cardiac Enzymes  Recent Labs Lab 09/09/14 0824  TROPONINI <0.03     RADIOLOGY:  No results found.    Management plans discussed with the patient, family and they are in agreement.  CODE STATUS:   TOTAL TIME TAKING CARE OF THIS PATIENT: 40 minutes.    Houston Siren M.D on 09/11/2014 at 4:01 PM  Between 7am to 6pm - Pager - [phone]  After 6pm go to [email or web address] - password [email or web address] [facility]  [facility]  Office  [phone]  CC: Primary care physician; Ruel Favors, MD

## 2014-09-11 NOTE — Plan of Care (Signed)
Problem: Discharge Progression Outcomes Goal: Activity appropriate for discharge plan Outcome: Completed/Met Date Met:  09/11/14 Pt is alert and oriented x 4, denies pain, on room air, wife at bedside, up to bathroom with one assist, orthostatic vitals improved, pt is d/c to home, f/u appointments scheduled, rx for midodrine given to patient, pt is d/c to home via wife and patients father, pt reprots understanding of d/c instructions, reports that he is not experiencing diziness with ambulation and has no further questions at this time. Uneventful shift.

## 2014-09-11 NOTE — Progress Notes (Signed)
[facility] - [place] at [facility]   PATIENT NAME: Angel Cruz    MR#:  [id number]  DATE OF BIRTH:  04-23-1955  SUBJECTIVE:  CHIEF COMPLAINT:   Chief Complaint  Patient presents with  . Hypotension   Patient presented to the hospital with hypotension and syncope. Feels a bit better today.  BP a bit elevated this a.m.   REVIEW OF SYSTEMS:    Review of Systems  Constitutional: Negative for fever and chills.  HENT: Negative for congestion and tinnitus.   Eyes: Negative for blurred vision and double vision.  Respiratory: Negative for cough, shortness of breath and wheezing.   Cardiovascular: Negative for chest pain, orthopnea and PND.  Gastrointestinal: Negative for nausea, vomiting, abdominal pain and diarrhea.  Genitourinary: Negative for dysuria and hematuria.  Neurological: Negative for dizziness, sensory change and focal weakness.  All other systems reviewed and are negative.   Nutrition: Heart healthy Tolerating Diet: Yes Tolerating PT: Seen by PT and they recommend no services.   DRUG ALLERGIES:  No Known Allergies  VITALS:  Blood pressure 139/83, pulse 61, temperature 97.6 F (36.4 C), temperature source Oral, resp. rate 18, height 5\' 7"  (1.702 m), weight 54.205 kg (119 lb 8 oz), SpO2 94 %.  PHYSICAL EXAMINATION:   Physical Exam  GENERAL:  59 y.o.-year-old patient lying in the bed with no acute distress.  EYES: Pupils equal, round, reactive to light and accommodation. No scleral icterus. Extraocular muscles intact.  HEENT: Head atraumatic, normocephalic. Oropharynx and nasopharynx clear.  NECK:  Supple, no jugular venous distention. No thyroid enlargement, no tenderness.  LUNGS: Normal breath sounds bilaterally, no wheezing, rales, rhonchi. No use of accessory muscles of respiration.  CARDIOVASCULAR: S1, S2 RRR. No murmurs, rubs, or gallops.  ABDOMEN: Soft, nontender, nondistended. Bowel sounds present. No organomegaly or mass.   EXTREMITIES: No cyanosis, clubbing or edema b/l.    NEUROLOGIC: Cranial nerves II through XII are intact. No focal Motor or sensory deficits b/l.   PSYCHIATRIC: The patient is alert and oriented x 3. Good affect SKIN: No obvious rash, lesion, or ulcer.    LABORATORY PANEL:   CBC  Recent Labs Lab 09/09/14 0225  WBC 4.7  HGB 10.6*  HCT 31.8*  PLT 125*   ------------------------------------------------------------------------------------------------------------------  Chemistries   Recent Labs Lab 09/10/14 0416  NA 140  K 4.3  CL 111  CO2 24  GLUCOSE 179*  BUN 24*  CREATININE 1.30*  CALCIUM 8.7*   ------------------------------------------------------------------------------------------------------------------  Cardiac Enzymes  Recent Labs Lab 09/09/14 0824  TROPONINI <0.03   ------------------------------------------------------------------------------------------------------------------  RADIOLOGY:  No results found.   ASSESSMENT AND PLAN:   59 year old male with past medical history of ischemic cardiomyopathy ejection fraction 35-40%, history of chronic systolic CHF, type 2 diabetes without consultation, hypertension, hyperlipidemia, depression, obstructive sleep apnea, chronic kidney disease stage III who presented to the hospital due to syncope and hypotension.  #1 orthostatic hypotension-this was likely the cause of patient's dizziness/syncope. - clinically feels better and no dizziness presently.   - as per Cardiology will add low dose Midodrine and resume Entresto and hold Coreg for now.  - follow Orthostatic vital signs.   - d/c IV fluids.   #2 acute on chronic renal failure-this is likely secondary to hypotension and ATN. -improved w/ gentle IV fluids and will monitor.   #3 history of chronic systolic CHF-clinically patient is not in congestive heart failure.   #4 history of depression-continue Lexapro.  #5 restless leg syndrome-continue  Requip.  #  6 hyperlipidemia-continue Crestor.  #7 diabetes without complication-continue insulin as scheduled.   All the records are reviewed and case discussed with Care Management/Social Workerr. Management plans discussed with the patient, family and they are in agreement.  CODE STATUS: Full  DVT Prophylaxis: Heparin subcutaneous  TOTAL TIME TAKING CARE OF THIS PATIENT: 30 minutes.   POSSIBLE D/C home tomorrow if orthostatic vital signs have improved and feels better.   Houston Siren M.D on 09/11/2014 at 8:22 AM  Between 7am to 6pm - Pager - [phone]  After 6pm go to [email or web address] - password [email or web address] [facility]  [place] [facility]  Office  [phone]  CC: Primary care physician; Ruel Favors, MD

## 2014-09-11 NOTE — Progress Notes (Signed)
Patient: Angel Cruz / Admit Date: 09/08/2014 / Date of Encounter: 09/11/2014, 9:01 AM   Subjective: Significantly improved orthostasis after starting midodrine 5 mg He had a dose at 1 PM yesterday with improvement of his numbers Systolic pressure with standing 108. Since then he denies any further dizziness with standing, going to the bathroom   Review of Systems: Review of Systems  Constitutional: Negative.   Respiratory: Negative.   Cardiovascular: Negative.   Gastrointestinal: Negative.   Musculoskeletal: Negative.   Neurological: Negative.   Psychiatric/Behavioral: Negative.   All other systems reviewed and are negative.  Constitutional: Positive for weight loss and malaise/fatigue. Negative for fever, chills and diaphoresis.  HENT: Negative for congestion.  Eyes: Negative for blurred vision, discharge and redness.  Respiratory: Negative. Negative for cough, hemoptysis, sputum production, shortness of breath and wheezing.  Cardiovascular: Positive for chest pain. Negative for palpitations, orthopnea, claudication, leg swelling and PND.   Chest pain resolved  Gastrointestinal: Negative. Negative for heartburn, nausea, vomiting and abdominal pain.  Musculoskeletal: Negative. Negative for myalgias and falls.  Skin: Negative for rash.  Neurological: Positive for dizziness and weakness. Negative for sensory change, speech change, focal weakness and headaches.  Psychiatric/Behavioral: Negative. The patient is not nervous/anxious.  All other systems reviewed and are negative.  Objective: Telemetry: Normal sinus rhythm with rate in the 60s  Physical Exam: Blood pressure 139/83, pulse 61, temperature 97.6 F (36.4 C), temperature source Oral, resp. rate 18, height  (1.702 m), weight 119 lb 8 oz (54.205 kg), SpO2 94 %. Body mass index is 18.71 kg/(m^2). General: Well developed, well nourished, in no acute distress. Head: Normocephalic, atraumatic, sclera  non-icteric, no xanthomas, nares are without discharge. Neck: Negative for carotid bruits. JVP not elevated. Lungs: Clear bilaterally to auscultation without wheezes, rales, or rhonchi. Breathing is unlabored. Heart: RRR S1 S2 without murmurs, rubs, or gallops.  Abdomen: Soft, non-tender, non-distended with normoactive bowel sounds. No rebound/guarding. Extremities: No clubbing or cyanosis. No edema. Distal pedal pulses are 2+ and equal bilaterally. Neuro: Alert and oriented X 3. Moves all extremities spontaneously. Psych:  Responds to questions appropriately with a normal affect.   Intake/Output Summary (Last 24 hours) at 09/11/14 0901 Last data filed at 09/10/14 2237  Gross per 24 hour  Intake    720 ml  Output      0 ml  Net    720 ml    Inpatient Medications:  . aspirin EC  81 mg Oral Daily  . escitalopram  10 mg Oral Daily  . heparin  5,000 Units Subcutaneous 3 times per day  . Insulin Degludec  10 Units Subcutaneous Daily  . midodrine  5 mg Oral BID WC  . QUEtiapine  100 mg Oral QHS  . rOPINIRole  0.5 mg Oral QHS  . rosuvastatin  20 mg Oral QHS  . vitamin B-12  100 mcg Oral Daily   Infusions:    Labs:  Recent Labs  09/09/14 0225 09/10/14 0416  NA 141 140  K 4.3 4.3  CL 111 111  CO2 24 24  GLUCOSE 187* 179*  BUN 29* 24*  CREATININE 1.69* 1.30*  CALCIUM 9.0 8.7*   No results for input(s): AST, ALT, ALKPHOS, BILITOT, PROT, ALBUMIN in the last 72 hours.  Recent Labs  09/08/14 1514 09/09/14 0225  WBC 5.1 4.7  HGB 11.4* 10.6*  HCT 34.9* 31.8*  MCV 88.4 87.6  PLT 156 125*    Recent Labs  09/08/14 1514 09/08/14 2143 09/09/14  0236 09/09/14 0824  TROPONINI <0.03 <0.03 <0.03 <0.03   Invalid input(s): POCBNP No results for input(s): HGBA1C in the last 72 hours.   Weights: Filed Weights   09/08/14 1504 09/09/14 0419  Weight: 214 lb (97.07 kg) 119 lb 8 oz (54.205 kg)     Radiology/Studies:  Dg Chest 2 View  08/15/2014   CLINICAL DATA:  Chest pain   EXAM: CHEST  2 VIEW  COMPARISON:  02/11/2014  FINDINGS: The heart size and mediastinal contours are within normal limits. Both lungs are clear. The visualized skeletal structures are unremarkable.  IMPRESSION: No active cardiopulmonary disease.   Electronically Signed   By: Marnee Spring M.D.   On: 08/15/2014 22:16     Assessment and Plan  59 year old male with history of CAD managed medically, ischemic cardiomyopathy, chronic systolic CHF, labile HTN, CKD stage III, HLD, DM2, morbid obesity, OSA, and depression who was recently seen in the [facility] ED on 7/28 for orthostasis and hyperkalemia and in clinic on 7/29 for the same presented to [facility] on 7/30 for dizziness and orthostasis. Cardiology is consulted for further evaluation.  1. Orhtostatic hypotension: Etiology unclear, Would continue midodrine 5 mg at 7 AM, 1 PM Okay to restart entresto, in the position or  2. Chest pain/CAD with recent cardiac cath as above:  atypical in nature -Recent cardiac cath as above. No further ischemic evaluation at this time  3. Hyperkalemia: Resolved, 4.3 Possibly from the entresto, will need to monitor  4. Ischemic cardiomyopthy/chronic systolic CHF: We'll hold carvedilol  for now.   5. Acute on CKD stage III: -Improved with IV fluids   Signed: Dossie Arbour M.D., Ph.D. [facility] 09/11/2014, 9:01 AM

## 2014-09-14 ENCOUNTER — Encounter: Payer: Self-pay | Admitting: Family Medicine

## 2014-09-14 ENCOUNTER — Ambulatory Visit (INDEPENDENT_AMBULATORY_CARE_PROVIDER_SITE_OTHER): Payer: Medicaid Other | Admitting: Family Medicine

## 2014-09-14 ENCOUNTER — Other Ambulatory Visit: Payer: Self-pay

## 2014-09-14 VITALS — BP 158/96 | HR 80 | Temp 98.4°F | Resp 16 | Ht 67.0 in | Wt 218.4 lb

## 2014-09-14 DIAGNOSIS — D638 Anemia in other chronic diseases classified elsewhere: Secondary | ICD-10-CM | POA: Insufficient documentation

## 2014-09-14 DIAGNOSIS — N183 Chronic kidney disease, stage 3 unspecified: Secondary | ICD-10-CM

## 2014-09-14 DIAGNOSIS — I951 Orthostatic hypotension: Secondary | ICD-10-CM

## 2014-09-14 DIAGNOSIS — F329 Major depressive disorder, single episode, unspecified: Secondary | ICD-10-CM

## 2014-09-14 DIAGNOSIS — I10 Essential (primary) hypertension: Secondary | ICD-10-CM

## 2014-09-14 DIAGNOSIS — D649 Anemia, unspecified: Secondary | ICD-10-CM

## 2014-09-14 DIAGNOSIS — F32A Depression, unspecified: Secondary | ICD-10-CM

## 2014-09-14 DIAGNOSIS — E875 Hyperkalemia: Secondary | ICD-10-CM

## 2014-09-14 MED ORDER — MIDODRINE HCL 10 MG PO TABS: 5.0000 mg | ORAL_TABLET | Freq: Three times a day (TID) | ORAL | Status: DC | PRN

## 2014-09-14 MED ORDER — ESCITALOPRAM OXALATE 10 MG PO TABS: 10.0000 mg | ORAL_TABLET | Freq: Every day | ORAL | Status: DC

## 2014-09-14 MED ORDER — ROSUVASTATIN CALCIUM 20 MG PO TABS: 20.0000 mg | ORAL_TABLET | Freq: Every day | ORAL | Status: DC

## 2014-09-14 MED ORDER — QUETIAPINE FUMARATE 100 MG PO TABS
100.0000 mg | ORAL_TABLET | Freq: Every day | ORAL | Status: DC
Start: 2014-09-14 — End: 2014-10-10

## 2014-09-14 NOTE — Telephone Encounter (Signed)
Patient requesting refill. 

## 2014-09-14 NOTE — Progress Notes (Signed)
Name: Angel Cruz   MRN: [id number]    DOB: Jun 20, 1955   Date:09/14/2014       Progress Note  Subjective  Chief Complaint  Chief Complaint  Patient presents with  . Hospitalization Follow-up    Went to ER on 7/28 for low blood  pressure and was diagnosed with dehydration and orthostatic hypotension    HPI  Hospital Follow up: He went to [facility] on July 28th, 2016 and diagnosed with dehydration, he was given IV fluids and sent home, he was seen by Dr. Hedy Camara the following day and he decreased dose of Carvedilol to 3.125 mg but symptoms did not improve so he was admitted for hypotension on July 30th, 2016. He was sent home off all cardiovascular medications until follow up with cardiologist and started on Midodrine.  He states he is feeling better since bp is higher, no dizziness, or SOB.  BP at home is around 150's/90's   Patient Active Problem List   Diagnosis Date Noted  . Coronary artery disease involving native coronary artery of native heart without angina pectoris   . Cardiomyopathy, ischemic   . Orthostatic hypotension 09/07/2014  . Hyperkalemia 09/07/2014  . Angioedema 07/30/2014  . RLS (restless legs syndrome) 07/30/2014  . B12 deficiency 07/30/2014  . Vitamin D deficiency 07/30/2014  . Chronic systolic heart failure 16/55/3748  . Ischemic cardiomyopathy   . CAD (coronary artery disease)   . Hypertension   . Hyperlipidemia   . 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 Depression   . CKD (chronic kidney disease), stage III     Past Surgical History  Procedure Laterality Date  . Cardiac catheterization  10/19/2013    [facility]  . Uvulectomy  1995  . Atrial fibrillation ablation  1995  . Elbow surgery Left   . Rotator cuff repair    . Carpal tunnel release      Family History  Problem Relation Age of Onset  . Stroke Mother   . COPD Mother   . Heart failure Mother   . Heart attack Mother   . Colon cancer Father   . Diabetes Father   . Diabetes  Brother   . Cancer Brother     History   Social History  . Marital Status: Married    Spouse Name: [name]  . Number of Children: 2  . Years of Education: N/A   Occupational History  . Not on file.   Social History Main Topics  . Smoking status: Never Smoker   . Smokeless tobacco: Never Used  . Alcohol Use: No  . Drug Use: No  . Sexual Activity: No   Other Topics Concern  . Not on file   Social History Narrative     Current outpatient prescriptions:  .  aspirin EC 81 MG tablet, Take 81 mg by mouth daily., Disp: , Rfl:  .  Cyanocobalamin (VITAMIN B-12 PO), Take 1 tablet by mouth daily., Disp: , Rfl:  .  escitalopram (LEXAPRO) 10 MG tablet, Take 1 tablet (10 mg total) by mouth daily., Disp: 30 tablet, Rfl: 1 .  Insulin Degludec (TRESIBA FLEXTOUCH) 200 UNIT/ML SOPN, Inject 10 Units into the skin daily., Disp: 5 pen, Rfl: 2 .  metFORMIN (GLUCOPHAGE) 850 MG tablet, Take 850 mg by mouth 2 (two) times daily with a meal., Disp: , Rfl:  .  midodrine (PROAMATINE) 10 MG tablet, Take 0.5 tablets (5 mg total) by mouth 3 (three) times daily as needed., Disp: 120 tablet, Rfl:  1 .  nitroGLYCERIN (NITROSTAT) 0.4 MG SL tablet, Place 0.4 mg under the tongue every 5 (five) minutes as needed for chest pain., Disp: , Rfl:  .  QUEtiapine (SEROQUEL) 100 MG tablet, Take 100 mg by mouth at bedtime., Disp: , Rfl:  .  rOPINIRole (REQUIP) 0.5 MG tablet, Take 1 tablet (0.5 mg total) by mouth at bedtime., Disp: 30 tablet, Rfl: 5 .  rosuvastatin (CRESTOR) 20 MG tablet, Take 20 mg by mouth at bedtime. , Disp: , Rfl:   No Known Allergies   ROS  Constitutional: Negative for fever or weight change.  Respiratory: Negative for cough and shortness of breath.   Cardiovascular: Negative for chest pain or palpitations.  Gastrointestinal: Negative for abdominal pain, no bowel changes.  Musculoskeletal: Negative for gait problem or joint swelling.  Skin: Negative for rash.  Neurological: Negative for  dizziness or headache.  No other specific complaints in a complete review of systems (except as listed in HPI above).  Objective  Filed Vitals:   09/14/14 0937  BP: 158/96  Pulse: 80  Temp: 98.4 F (36.9 C)  TempSrc: Oral  Resp: 16  Height: $Remove'5\' 7"'icBxAzH$  (1.702 m)  Weight: 218 lb 6.4 oz (99.066 kg)  SpO2: 95%    Body mass index is 34.2 kg/(m^2).  Physical Exam  Constitutional: Patient appears well-developed and well-nourished. Obese No distress.  Eyes:  No scleral icterus. PERL Neck: Normal range of motion. Neck supple. Cardiovascular: Normal rate, regular rhythm and normal heart sounds.  No murmur heard. No BLE edema. Pulmonary/Chest: Effort normal and breath sounds normal. No respiratory distress. Abdominal: Soft.  There is no tenderness. Psychiatric: Patient has a normal mood and affect. behavior is normal. Judgment and thought content normal.  Recent Results (from the past 2160 hour(s))  Lipid panel     Status: Abnormal   Collection Time: 07/31/14  9:38 AM  Result Value Ref Range   Cholesterol 153 0 - 200 mg/dL   Triglycerides 346 (H) <150 mg/dL   HDL 24 (L) >40 mg/dL   Total CHOL/HDL Ratio 6.4 RATIO   VLDL 69 (H) 0 - 40 mg/dL   LDL Cholesterol 60 0 - 99 mg/dL    Comment:        Total Cholesterol/HDL:CHD Risk Coronary Heart Disease Risk Table                     Men   Women  1/2 Average Risk   3.4   3.3  Average Risk       5.0   4.4  2 X Average Risk   9.6   7.1  3 X Average Risk  23.4   11.0        Use the calculated Patient Ratio above and the CHD Risk Table to determine the patient's CHD Risk.        ATP III CLASSIFICATION (LDL):  <100     mg/dL   Optimal  100-129  mg/dL   Near or Above                    Optimal  130-159  mg/dL   Borderline  160-189  mg/dL   High  >190     mg/dL   Very High   Comprehensive metabolic panel     Status: Abnormal   Collection Time: 07/31/14  9:38 AM  Result Value Ref Range   Sodium 136 135 - 145 mmol/L   Potassium 4.2 3.5  - 5.1  mmol/L   Chloride 108 101 - 111 mmol/L   CO2 25 22 - 32 mmol/L   Glucose, Bld 219 (H) 65 - 99 mg/dL   BUN 42 (H) 6 - 20 mg/dL   Creatinine, Ser 1.73 (H) 0.61 - 1.24 mg/dL   Calcium 9.3 8.9 - 10.3 mg/dL   Total Protein 6.3 (L) 6.5 - 8.1 g/dL   Albumin 3.6 3.5 - 5.0 g/dL   AST 18 15 - 41 U/L   ALT 22 17 - 63 U/L   Alkaline Phosphatase 63 38 - 126 U/L   Total Bilirubin 0.6 0.3 - 1.2 mg/dL   GFR calc non Af Amer 42 (L) >60 mL/min   GFR calc Af Amer 49 (L) >60 mL/min    Comment: (NOTE) The eGFR has been calculated using the CKD EPI equation. This calculation has not been validated in all clinical situations. eGFR's persistently <60 mL/min signify possible Chronic Kidney Disease.    Anion gap 3 (L) 5 - 15  Vit D  25 hydroxy (rtn osteoporosis monitoring)     Status: Abnormal   Collection Time: 07/31/14  9:38 AM  Result Value Ref Range   Vit D, 25-Hydroxy 23.3 (L) 30.0 - 100.0 ng/mL    Comment: (NOTE) Vitamin D deficiency has been defined by the [place] practice guideline as a level of serum 25-OH vitamin D less than 20 ng/mL (1,2). The Endocrine Society went on to further define vitamin D insufficiency as a level between 21 and 29 ng/mL (2). 1. IOM (Institute of Medicine). 2010. Dietary reference   intakes for calcium and D. [place]: The   [employer]. 2. Holick MF, Binkley [place], Bischoff-Ferrari HA, et al.   Evaluation, treatment, and prevention of vitamin D   deficiency: an Endocrine Society clinical practice   guideline. JCEM. 2011 Jul; 96(7):1911-30. Performed At: [facility] [place], [place] [id number] Lindon Romp MD SV:[id number]   Hemoglobin A1c     Status: Abnormal   Collection Time: 07/31/14  9:38 AM  Result Value Ref Range   Hgb A1c MFr Bld 10.7 (H) 4.0 - 6.0 %  Vitamin B12     Status: None   Collection Time: 07/31/14  9:38 AM  Result Value Ref Range   Vitamin B-12 677 180 -  914 pg/mL    Comment: (NOTE) This assay is not validated for testing neonatal or myeloproliferative syndrome specimens for Vitamin B12 levels. Performed at [facility]   Comprehensive metabolic panel     Status: Abnormal   Collection Time: 08/15/14  7:51 PM  Result Value Ref Range   Sodium 136 135 - 145 mmol/L   Potassium 4.9 3.5 - 5.1 mmol/L   Chloride 107 101 - 111 mmol/L   CO2 23 22 - 32 mmol/L   Glucose, Bld 159 (H) 65 - 99 mg/dL   BUN 40 (H) 6 - 20 mg/dL   Creatinine, Ser 1.39 (H) 0.61 - 1.24 mg/dL   Calcium 9.0 8.9 - 10.3 mg/dL   Total Protein 7.0 6.5 - 8.1 g/dL   Albumin 4.0 3.5 - 5.0 g/dL   AST 18 15 - 41 U/L   ALT 14 (L) 17 - 63 U/L   Alkaline Phosphatase 51 38 - 126 U/L   Total Bilirubin 0.3 0.3 - 1.2 mg/dL   GFR calc non Af Amer 55 (L) >60 mL/min   GFR calc Af Amer >60 >60 mL/min    Comment: (NOTE)  The eGFR has been calculated using the CKD EPI equation. This calculation has not been validated in all clinical situations. eGFR's persistently <60 mL/min signify possible Chronic Kidney Disease.    Anion gap 6 5 - 15  CBC with Differential     Status: Abnormal   Collection Time: 08/15/14  7:51 PM  Result Value Ref Range   WBC 5.5 3.8 - 10.6 K/uL   RBC 4.16 (L) 4.40 - 5.90 MIL/uL   Hemoglobin 12.2 (L) 13.0 - 18.0 g/dL   HCT 36.6 (L) 40.0 - 52.0 %   MCV 88.1 80.0 - 100.0 fL   MCH 29.3 26.0 - 34.0 pg   MCHC 33.3 32.0 - 36.0 g/dL   RDW 14.4 11.5 - 14.5 %   Platelets 170 150 - 440 K/uL   Neutrophils Relative % 60 %   Neutro Abs 3.3 1.4 - 6.5 K/uL   Lymphocytes Relative 30 %   Lymphs Abs 1.6 1.0 - 3.6 K/uL   Monocytes Relative 5 %   Monocytes Absolute 0.3 0.2 - 1.0 K/uL   Eosinophils Relative 4 %   Eosinophils Absolute 0.2 0 - 0.7 K/uL   Basophils Relative 1 %   Basophils Absolute 0.1 0 - 0.1 K/uL  Troponin I     Status: None   Collection Time: 08/15/14  7:51 PM  Result Value Ref Range   Troponin I <0.03 <0.031 ng/mL    Comment:        NO INDICATION  OF MYOCARDIAL INJURY.   Basic metabolic panel     Status: Abnormal   Collection Time: 09/06/14  3:43 PM  Result Value Ref Range   Sodium 136 135 - 145 mmol/L   Potassium 5.3 (H) 3.5 - 5.1 mmol/L   Chloride 106 101 - 111 mmol/L   CO2 22 22 - 32 mmol/L   Glucose, Bld 176 (H) 65 - 99 mg/dL   BUN 27 (H) 6 - 20 mg/dL   Creatinine, Ser 1.74 (H) 0.61 - 1.24 mg/dL   Calcium 9.3 8.9 - 10.3 mg/dL   GFR calc non Af Amer 42 (L) >60 mL/min   GFR calc Af Amer 49 (L) >60 mL/min    Comment: (NOTE) The eGFR has been calculated using the CKD EPI equation. This calculation has not been validated in all clinical situations. eGFR's persistently <60 mL/min signify possible Chronic Kidney Disease.    Anion gap 8 5 - 15  CBC     Status: Abnormal   Collection Time: 09/06/14  3:43 PM  Result Value Ref Range   WBC 6.9 3.8 - 10.6 K/uL   RBC 4.28 (L) 4.40 - 5.90 MIL/uL   Hemoglobin 12.6 (L) 13.0 - 18.0 g/dL   HCT 38.1 (L) 40.0 - 52.0 %   MCV 89.1 80.0 - 100.0 fL   MCH 29.3 26.0 - 34.0 pg   MCHC 32.9 32.0 - 36.0 g/dL   RDW 14.5 11.5 - 14.5 %   Platelets 160 150 - 440 K/uL  Troponin I     Status: None   Collection Time: 09/06/14  3:43 PM  Result Value Ref Range   Troponin I <0.03 <0.031 ng/mL    Comment:        NO INDICATION OF MYOCARDIAL INJURY.   Potassium     Status: Abnormal   Collection Time: 09/06/14  6:46 PM  Result Value Ref Range   Potassium 5.6 (H) 3.5 - 5.1 mmol/L  Potassium     Status: Abnormal   Collection Time:  09/06/14  9:27 PM  Result Value Ref Range   Potassium 5.5 (H) 3.5 - 5.1 mmol/L  Basic Metabolic Panel (BMET)     Status: Abnormal   Collection Time: 09/07/14  9:57 AM  Result Value Ref Range   Glucose 145 (H) 65 - 99 mg/dL   BUN 30 (H) 6 - 24 mg/dL   Creatinine, Ser 1.79 (H) 0.76 - 1.27 mg/dL   GFR calc non Af Amer 41 (L) >59 mL/min/1.73   GFR calc Af Amer 48 (L) >59 mL/min/1.73   BUN/Creatinine Ratio 17 9 - 20   Sodium 138 134 - 144 mmol/L   Potassium 5.7 (H) 3.5 -  5.2 mmol/L   Chloride 103 97 - 108 mmol/L   CO2 20 18 - 29 mmol/L   Calcium 9.9 8.7 - 10.2 mg/dL  Troponin I     Status: None   Collection Time: 09/08/14  3:14 PM  Result Value Ref Range   Troponin I <0.03 <0.031 ng/mL    Comment:        NO INDICATION OF MYOCARDIAL INJURY.   CBC     Status: Abnormal   Collection Time: 09/08/14  3:14 PM  Result Value Ref Range   WBC 5.1 3.8 - 10.6 K/uL   RBC 3.94 (L) 4.40 - 5.90 MIL/uL   Hemoglobin 11.4 (L) 13.0 - 18.0 g/dL   HCT 34.9 (L) 40.0 - 52.0 %   MCV 88.4 80.0 - 100.0 fL   MCH 29.0 26.0 - 34.0 pg   MCHC 32.8 32.0 - 36.0 g/dL   RDW 13.7 11.5 - 14.5 %   Platelets 156 150 - 440 K/uL  Basic metabolic panel     Status: Abnormal   Collection Time: 09/08/14  3:14 PM  Result Value Ref Range   Sodium 136 135 - 145 mmol/L   Potassium 5.6 (H) 3.5 - 5.1 mmol/L   Chloride 109 101 - 111 mmol/L   CO2 23 22 - 32 mmol/L   Glucose, Bld 208 (H) 65 - 99 mg/dL   BUN 33 (H) 6 - 20 mg/dL   Creatinine, Ser 2.01 (H) 0.61 - 1.24 mg/dL   Calcium 9.7 8.9 - 10.3 mg/dL   GFR calc non Af Amer 35 (L) >60 mL/min   GFR calc Af Amer 41 (L) >60 mL/min    Comment: (NOTE) The eGFR has been calculated using the CKD EPI equation. This calculation has not been validated in all clinical situations. eGFR's persistently <60 mL/min signify possible Chronic Kidney Disease.    Anion gap 4 (L) 5 - 15  Troponin I     Status: None   Collection Time: 09/08/14  9:43 PM  Result Value Ref Range   Troponin I <0.03 <0.031 ng/mL    Comment:        NO INDICATION OF MYOCARDIAL INJURY.   Lipid panel     Status: Abnormal   Collection Time: 09/09/14  2:25 AM  Result Value Ref Range   Cholesterol 93 0 - 200 mg/dL   Triglycerides 220 (H) <150 mg/dL   HDL 23 (L) >40 mg/dL   Total CHOL/HDL Ratio 4.0 RATIO   VLDL 44 (H) 0 - 40 mg/dL   LDL Cholesterol 26 0 - 99 mg/dL    Comment:        Total Cholesterol/HDL:CHD Risk Coronary Heart Disease Risk Table                     Men    Women  1/2 Average Risk   3.4   3.3  Average Risk       5.0   4.4  2 X Average Risk   9.6   7.1  3 X Average Risk  23.4   11.0        Use the calculated Patient Ratio above and the CHD Risk Table to determine the patient's CHD Risk.        ATP III CLASSIFICATION (LDL):  <100     mg/dL   Optimal  100-129  mg/dL   Near or Above                    Optimal  130-159  mg/dL   Borderline  160-189  mg/dL   High  >190     mg/dL   Very High   CBC     Status: Abnormal   Collection Time: 09/09/14  2:25 AM  Result Value Ref Range   WBC 4.7 3.8 - 10.6 K/uL   RBC 3.63 (L) 4.40 - 5.90 MIL/uL   Hemoglobin 10.6 (L) 13.0 - 18.0 g/dL   HCT 31.8 (L) 40.0 - 52.0 %   MCV 87.6 80.0 - 100.0 fL   MCH 29.2 26.0 - 34.0 pg   MCHC 33.3 32.0 - 36.0 g/dL   RDW 14.2 11.5 - 14.5 %   Platelets 125 (L) 150 - 440 K/uL  Basic metabolic panel     Status: Abnormal   Collection Time: 09/09/14  2:25 AM  Result Value Ref Range   Sodium 141 135 - 145 mmol/L   Potassium 4.3 3.5 - 5.1 mmol/L   Chloride 111 101 - 111 mmol/L   CO2 24 22 - 32 mmol/L   Glucose, Bld 187 (H) 65 - 99 mg/dL   BUN 29 (H) 6 - 20 mg/dL   Creatinine, Ser 1.69 (H) 0.61 - 1.24 mg/dL   Calcium 9.0 8.9 - 10.3 mg/dL   GFR calc non Af Amer 44 (L) >60 mL/min   GFR calc Af Amer 51 (L) >60 mL/min    Comment: (NOTE) The eGFR has been calculated using the CKD EPI equation. This calculation has not been validated in all clinical situations. eGFR's persistently <60 mL/min signify possible Chronic Kidney Disease.    Anion gap 6 5 - 15  Troponin I     Status: None   Collection Time: 09/09/14  2:36 AM  Result Value Ref Range   Troponin I <0.03 <0.031 ng/mL    Comment:        NO INDICATION OF MYOCARDIAL INJURY.   Troponin I     Status: None   Collection Time: 09/09/14  8:24 AM  Result Value Ref Range   Troponin I <0.03 <0.031 ng/mL    Comment:        NO INDICATION OF MYOCARDIAL INJURY.   Glucose, capillary     Status: Abnormal   Collection  Time: 09/09/14  8:47 PM  Result Value Ref Range   Glucose-Capillary 172 (H) 65 - 99 mg/dL  Basic metabolic panel     Status: Abnormal   Collection Time: 09/10/14  4:16 AM  Result Value Ref Range   Sodium 140 135 - 145 mmol/L   Potassium 4.3 3.5 - 5.1 mmol/L   Chloride 111 101 - 111 mmol/L   CO2 24 22 - 32 mmol/L   Glucose, Bld 179 (H) 65 - 99 mg/dL   BUN 24 (H) 6 - 20 mg/dL   Creatinine, Ser 1.30 (H)  0.61 - 1.24 mg/dL   Calcium 8.7 (L) 8.9 - 10.3 mg/dL   GFR calc non Af Amer 60 (L) >60 mL/min   GFR calc Af Amer >60 >60 mL/min    Comment: (NOTE) The eGFR has been calculated using the CKD EPI equation. This calculation has not been validated in all clinical situations. eGFR's persistently <60 mL/min signify possible Chronic Kidney Disease.    Anion gap 5 5 - 15  Glucose, capillary     Status: Abnormal   Collection Time: 09/10/14 10:19 PM  Result Value Ref Range   Glucose-Capillary 182 (H) 65 - 99 mg/dL     PHQ2/9: Depression screen PHQ 2/9 07/30/2014  Decreased Interest 1  Down, Depressed, Hopeless 1  PHQ - 2 Score 2  Altered sleeping 3  Tired, decreased energy 2  Change in appetite 2  Feeling bad or failure about yourself  1  Trouble concentrating 1  Moving slowly or fidgety/restless 2  Suicidal thoughts 0  PHQ-9 Score 13  Difficult doing work/chores Somewhat difficult     Fall Risk: Fall Risk  07/30/2014  Falls in the past year? No      Assessment & Plan  1. Essential hypertension Continue follow up with cardiologist but we will also refer him to a nephrologist for further evaluation  - Ambulatory referral to Nephrology  2. Orthostatic hypotension Resolved with hydration and also discontinuation of his bp medications  3. CKD (chronic kidney disease), stage III  - Ambulatory referral to Nephrology  4. Hyperkalemia  - Ambulatory referral to Nephrology  5. Anemia, unspecified anemia type It may have been dilutional, but may be of chronic disease or  iron deficiency, patient prefers having labs done at nephrologist for further evaluation  - Ambulatory referral to Nephrology

## 2014-09-14 NOTE — Telephone Encounter (Signed)
ERRENOUS °

## 2014-09-18 ENCOUNTER — Ambulatory Visit: Payer: Medicaid Other | Admitting: Family Medicine

## 2014-09-20 ENCOUNTER — Encounter: Payer: Self-pay | Admitting: Cardiovascular Disease

## 2014-09-20 ENCOUNTER — Ambulatory Visit (INDEPENDENT_AMBULATORY_CARE_PROVIDER_SITE_OTHER): Payer: Medicaid Other | Admitting: Cardiovascular Disease

## 2014-09-20 VITALS — BP 140/82 | HR 72 | Ht 67.0 in | Wt 216.0 lb

## 2014-09-20 DIAGNOSIS — I10 Essential (primary) hypertension: Secondary | ICD-10-CM

## 2014-09-20 DIAGNOSIS — I5022 Chronic systolic (congestive) heart failure: Secondary | ICD-10-CM

## 2014-09-20 DIAGNOSIS — I2510 Atherosclerotic heart disease of native coronary artery without angina pectoris: Secondary | ICD-10-CM

## 2014-09-20 MED ORDER — CARVEDILOL 3.125 MG PO TABS: 3.1250 mg | ORAL_TABLET | Freq: Two times a day (BID) | ORAL | Status: DC

## 2014-09-20 NOTE — Assessment & Plan Note (Signed)
I discussed with him the importance of optimal fluid intake. It is not entirely clear why he keeps going into episodes of volume depletion in spite of not being on a diaphoretic. He is currently using Midodrin 5 mg as needed most of the time once daily.

## 2014-09-20 NOTE — Progress Notes (Signed)
HPI   59 year old male who is here today for a followup visit regarding coronary artery disease and chronic systolic heart failure. He was admitted to [facility] in September, 2015 with complaints of cough and chest pain.  He ruled out for myocardial infarction.  He underwent stress testing which revealed apical infarct with peri-infarct ischemia.  EF was 31%.  Echocardiogram showed EF 35-40% with anteroseptal, anterior, apical wall motion abnormalities. He underwent cardiac catheterization which revealed an occluded mid LAD with otherwise nonobstructive CAD.  There were faint right to left collaterals supplying the distal LAD distribution.  He had recurrent problems with labile hypertension as well as episodes of fluid overload alternating with volume depletion whenever he is on a diuretic.  The dose of carvedilol was gradually decreased due to hypotension. He continued to feel dizzy and was hospitalized at [facility] for acute on chronic renal failure and orthostatic hypotension. He improved with hydration. All heart failure medications were suspended. He was started on small dose Midodrin 5 mg to be taken 3 times daily only as needed. He has been mainly using it once a day with improvement and dizziness. He reports drinking enough fluids. He is going to see nephrology in the near future. He denies chest pain, significant shortness of breath or leg edema.  No Known Allergies   Current Outpatient Prescriptions on File Prior to Visit  Medication Sig Dispense Refill  . aspirin EC 81 MG tablet Take 81 mg by mouth daily.    . Cyanocobalamin (VITAMIN B-12 PO) Take 1 tablet by mouth daily.    Marland Kitchen escitalopram (LEXAPRO) 10 MG tablet Take 1 tablet (10 mg total) by mouth daily. 30 tablet 1  . Insulin Degludec (TRESIBA FLEXTOUCH) 200 UNIT/ML SOPN Inject 10 Units into the skin daily. 5 pen 2  . metFORMIN (GLUCOPHAGE) 850 MG tablet Take 850 mg by mouth 2 (two) times daily with a meal.    .  midodrine (PROAMATINE) 10 MG tablet Take 0.5 tablets (5 mg total) by mouth 3 (three) times daily as needed. 120 tablet 1  . nitroGLYCERIN (NITROSTAT) 0.4 MG SL tablet Place 0.4 mg under the tongue every 5 (five) minutes as needed for chest pain.    Marland Kitchen QUEtiapine (SEROQUEL) 100 MG tablet Take 1 tablet (100 mg total) by mouth at bedtime. 30 tablet 0  . rOPINIRole (REQUIP) 0.5 MG tablet Take 1 tablet (0.5 mg total) by mouth at bedtime. 30 tablet 5  . rosuvastatin (CRESTOR) 20 MG tablet Take 1 tablet (20 mg total) by mouth at bedtime. 30 tablet 0   No current facility-administered medications on file prior to visit.     Past Medical History  Diagnosis Date  . Diabetes mellitus without complication   . Hypertension   . Hyperlipidemia   . Depression   . Rotator cuff tear   . CKD (chronic kidney disease), stage III   . Ischemic cardiomyopathy     a. 10/2013 Echo: EF 35-40%, severe distal anteroseptal, anterior, and apical HK. Diast dysfxn, mild conc LVH, mildly dil LA, mild Ao sclerosis w/o stenosis.  Marland Kitchen CAD (coronary artery disease)     a. 10/2013 Lexi MV: EF 31%, apical, septal, ant-apical, inf-apical, lat-apical scar, septal and apical peri-infarct ischemia;  b. 10/2013 Cath: LM nl, LAD 20ost, 155m (faint R->L collats), D1 80p, LCX min irregs, OM1 min irregs, OM2 nl, OM3 30p, RCA 20p, PDA 60, RPL min irregs-->Med Rx.  . Morbid obesity   . CHF (congestive heart failure)   .  Depression   . OSA (obstructive sleep apnea)   . Sleep apnea     recent test confirmed diagnosis, was performed at the sleep center across the street     Past Surgical History  Procedure Laterality Date  . Cardiac catheterization  10/19/2013    [facility]  . Uvulectomy  1995  . Atrial fibrillation ablation  1995  . Elbow surgery Left   . Rotator cuff repair    . Carpal tunnel release       Family History  Problem Relation Age of Onset  . Stroke Mother   . COPD Mother   . Heart failure Mother   . Heart attack Mother    . Colon cancer Father   . Diabetes Father   . Diabetes Brother   . Cancer Brother      Social History   Social History  . Marital Status: Married    Spouse Name: Angelique Blonder  . Number of Children: 2  . Years of Education: N/A   Occupational History  . Not on file.   Social History Main Topics  . Smoking status: Never Smoker   . Smokeless tobacco: Never Used  . Alcohol Use: No  . Drug Use: No  . Sexual Activity: No   Other Topics Concern  . Not on file   Social History Narrative     PHYSICAL EXAM   BP 140/82 mmHg  Pulse 72  Ht 5\' 7"  (1.702 m)  Wt 216 lb (97.977 kg)  BMI 33.82 kg/m2 Constitutional: He is oriented to person, place, and time. He appears well-developed and well-nourished. No distress.  HENT: No nasal discharge.  Head: Normocephalic and atraumatic.  Eyes: Pupils are equal and round.  No discharge. Neck: Normal range of motion. Neck supple. No JVD present. No thyromegaly present.  Cardiovascular: Normal rate, regular rhythm, normal heart sounds. Exam reveals no gallop and no friction rub. No murmur heard.  Pulmonary/Chest: Effort normal and breath sounds normal. No stridor. No respiratory distress. He has no wheezes. He has no rales. He exhibits no tenderness.  Abdominal: Soft. Bowel sounds are normal. He exhibits no distension. There is no tenderness. There is no rebound and no guarding.  Musculoskeletal: Normal range of motion. He exhibits no edema and no tenderness.  Neurological: He is alert and oriented to person, place, and time. Coordination normal.  Skin: Skin is warm and dry. No rash noted. He is not diaphoretic. No erythema. No pallor.  Psychiatric: He has a normal mood and affect. His behavior is normal. Judgment and thought content normal.      ASSESSMENT AND PLAN

## 2014-09-20 NOTE — Assessment & Plan Note (Signed)
The patient has no symptoms of angina. Continue medical therapy. 

## 2014-09-20 NOTE — Assessment & Plan Note (Signed)
The patient seems to be euvolemic and symptoms have been stable. His heart failure medications are on hold due to recent episodes of orthostatic hypotension and volume depletion with hypotension. I elected to resume small dose carvedilol 3.125 mg twice daily. If blood pressure allows, I will consider resuming an ACE inhibitor/ARB. I will likely avoid Entersto  given his episodes of hypotension.

## 2014-09-20 NOTE — Patient Instructions (Signed)
Medication Instructions:  Your physician has recommended you make the following change in your medication:  START taking coreg 3.125 twice a day   Labwork: none  Testing/Procedures: none  Follow-Up: Your physician recommends that you keep Sept appt with Dr. Kirke Corin   Any Other Special Instructions Will Be Listed Below (If Applicable).

## 2014-09-24 ENCOUNTER — Telehealth: Payer: Self-pay

## 2014-09-24 ENCOUNTER — Inpatient Hospital Stay
Admission: EM | Admit: 2014-09-24 | Discharge: 2014-09-27 | DRG: 683 | Disposition: A | Discharge status: Home / Self Care | Payer: Medicaid Other | Attending: Internal Medicine | Admitting: Internal Medicine

## 2014-09-24 ENCOUNTER — Telehealth: Payer: Self-pay | Admitting: Cardiovascular Disease

## 2014-09-24 ENCOUNTER — Encounter: Payer: Self-pay | Admitting: Emergency Medicine

## 2014-09-24 DIAGNOSIS — E860 Dehydration: Secondary | ICD-10-CM | POA: Diagnosis present

## 2014-09-24 DIAGNOSIS — N179 Acute kidney failure, unspecified: Secondary | ICD-10-CM

## 2014-09-24 DIAGNOSIS — N183 Chronic kidney disease, stage 3 (moderate): Secondary | ICD-10-CM | POA: Diagnosis present

## 2014-09-24 DIAGNOSIS — E1122 Type 2 diabetes mellitus with diabetic chronic kidney disease: Secondary | ICD-10-CM | POA: Diagnosis present

## 2014-09-24 DIAGNOSIS — I951 Orthostatic hypotension: Secondary | ICD-10-CM | POA: Diagnosis present

## 2014-09-24 DIAGNOSIS — Z9889 Other specified postprocedural states: Secondary | ICD-10-CM

## 2014-09-24 DIAGNOSIS — Z825 Family history of asthma and other chronic lower respiratory diseases: Secondary | ICD-10-CM

## 2014-09-24 DIAGNOSIS — Z7982 Long term (current) use of aspirin: Secondary | ICD-10-CM

## 2014-09-24 DIAGNOSIS — I129 Hypertensive chronic kidney disease with stage 1 through stage 4 chronic kidney disease, or unspecified chronic kidney disease: Secondary | ICD-10-CM | POA: Diagnosis present

## 2014-09-24 DIAGNOSIS — N170 Acute kidney failure with tubular necrosis: Principal | ICD-10-CM | POA: Diagnosis present

## 2014-09-24 DIAGNOSIS — Z833 Family history of diabetes mellitus: Secondary | ICD-10-CM | POA: Diagnosis not present

## 2014-09-24 DIAGNOSIS — R0600 Dyspnea, unspecified: Secondary | ICD-10-CM | POA: Diagnosis not present

## 2014-09-24 DIAGNOSIS — D631 Anemia in chronic kidney disease: Secondary | ICD-10-CM | POA: Diagnosis present

## 2014-09-24 DIAGNOSIS — I2510 Atherosclerotic heart disease of native coronary artery without angina pectoris: Secondary | ICD-10-CM | POA: Diagnosis present

## 2014-09-24 DIAGNOSIS — Z800 Family history of malignant neoplasm of digestive organs: Secondary | ICD-10-CM | POA: Diagnosis not present

## 2014-09-24 DIAGNOSIS — Z823 Family history of stroke: Secondary | ICD-10-CM | POA: Diagnosis not present

## 2014-09-24 DIAGNOSIS — I5022 Chronic systolic (congestive) heart failure: Secondary | ICD-10-CM | POA: Diagnosis not present

## 2014-09-24 DIAGNOSIS — Z79899 Other long term (current) drug therapy: Secondary | ICD-10-CM | POA: Diagnosis not present

## 2014-09-24 DIAGNOSIS — G4733 Obstructive sleep apnea (adult) (pediatric): Secondary | ICD-10-CM | POA: Diagnosis present

## 2014-09-24 DIAGNOSIS — E785 Hyperlipidemia, unspecified: Secondary | ICD-10-CM | POA: Diagnosis present

## 2014-09-24 DIAGNOSIS — I509 Heart failure, unspecified: Secondary | ICD-10-CM | POA: Diagnosis not present

## 2014-09-24 DIAGNOSIS — E11319 Type 2 diabetes mellitus with unspecified diabetic retinopathy without macular edema: Secondary | ICD-10-CM | POA: Diagnosis present

## 2014-09-24 DIAGNOSIS — Z794 Long term (current) use of insulin: Secondary | ICD-10-CM

## 2014-09-24 DIAGNOSIS — Z6832 Body mass index (BMI) 32.0-32.9, adult: Secondary | ICD-10-CM

## 2014-09-24 DIAGNOSIS — E875 Hyperkalemia: Secondary | ICD-10-CM | POA: Diagnosis present

## 2014-09-24 DIAGNOSIS — I959 Hypotension, unspecified: Secondary | ICD-10-CM

## 2014-09-24 DIAGNOSIS — I255 Ischemic cardiomyopathy: Secondary | ICD-10-CM | POA: Diagnosis present

## 2014-09-24 DIAGNOSIS — Z8249 Family history of ischemic heart disease and other diseases of the circulatory system: Secondary | ICD-10-CM | POA: Diagnosis not present

## 2014-09-24 HISTORY — DX: Hypotension, unspecified: I95.9

## 2014-09-24 LAB — URINALYSIS COMPLETE WITH MICROSCOPIC (ARMC ONLY)
Bilirubin Urine: NEGATIVE
GLUCOSE, UA: NEGATIVE mg/dL
Ketones, ur: NEGATIVE mg/dL
Leukocytes, UA: NEGATIVE
Nitrite: NEGATIVE
Protein, ur: 100 mg/dL — AB
Specific Gravity, Urine: 1.018 (ref 1.005–1.030)
pH: 5 (ref 5.0–8.0)

## 2014-09-24 LAB — CBC
HCT: 37.7 % — ABNORMAL LOW (ref 40.0–52.0)
Hemoglobin: 12.3 g/dL — ABNORMAL LOW (ref 13.0–18.0)
MCH: 28.6 pg (ref 26.0–34.0)
MCHC: 32.7 g/dL (ref 32.0–36.0)
MCV: 87.4 fL (ref 80.0–100.0)
PLATELETS: 202 10*3/uL (ref 150–440)
RBC: 4.32 MIL/uL — AB (ref 4.40–5.90)
RDW: 13.3 % (ref 11.5–14.5)
WBC: 7.6 10*3/uL (ref 3.8–10.6)

## 2014-09-24 LAB — BASIC METABOLIC PANEL
Anion gap: 12 (ref 5–15)
BUN: 69 mg/dL — AB (ref 6–20)
CALCIUM: 9.9 mg/dL (ref 8.9–10.3)
CHLORIDE: 101 mmol/L (ref 101–111)
CO2: 23 mmol/L (ref 22–32)
CREATININE: 4.03 mg/dL — AB (ref 0.61–1.24)
GFR calc non Af Amer: 15 mL/min — ABNORMAL LOW (ref >60)
GFR, EST AFRICAN AMERICAN: 18 mL/min — AB (ref >60)
GLUCOSE: 189 mg/dL — AB (ref 65–99)
Potassium: 4.2 mmol/L (ref 3.5–5.1)
Sodium: 136 mmol/L (ref 135–145)

## 2014-09-24 LAB — GLUCOSE, CAPILLARY: Glucose-Capillary: 75 mg/dL (ref 65–99)

## 2014-09-24 MED ORDER — ASPIRIN EC 81 MG PO TBEC
81.0000 mg | DELAYED_RELEASE_TABLET | Freq: Every day | ORAL | Status: DC
  Administered 2014-09-25 – 2014-09-27 (×3): 81 mg via ORAL
  Filled 2014-09-24 (×3): qty 1

## 2014-09-24 MED ORDER — NITROGLYCERIN 0.4 MG SL SUBL: 0.4000 mg | SUBLINGUAL_TABLET | SUBLINGUAL | Status: DC | PRN

## 2014-09-24 MED ORDER — SODIUM CHLORIDE 0.9 % IV SOLN
INTRAVENOUS | Status: DC
  Administered 2014-09-24 – 2014-09-26 (×4): via INTRAVENOUS

## 2014-09-24 MED ORDER — SODIUM CHLORIDE 0.9 % IV BOLUS (SEPSIS)
1000.0000 mL | Freq: Once | INTRAVENOUS | Status: AC
  Administered 2014-09-24: 1000 mL via INTRAVENOUS

## 2014-09-24 MED ORDER — ACETAMINOPHEN 325 MG PO TABS
650.0000 mg | ORAL_TABLET | Freq: Four times a day (QID) | ORAL | Status: DC | PRN
  Administered 2014-09-27: 650 mg via ORAL
  Filled 2014-09-24: qty 2

## 2014-09-24 MED ORDER — INSULIN DEGLUDEC 200 UNIT/ML ~~LOC~~ SOPN
10.0000 [IU] | PEN_INJECTOR | Freq: Every day | SUBCUTANEOUS | Status: DC
  Administered 2014-09-25 – 2014-09-26 (×2): 10 [IU] via SUBCUTANEOUS
  Filled 2014-09-24: qty 10

## 2014-09-24 MED ORDER — ACETAMINOPHEN 650 MG RE SUPP: 650.0000 mg | Freq: Four times a day (QID) | RECTAL | Status: DC | PRN

## 2014-09-24 MED ORDER — QUETIAPINE FUMARATE 25 MG PO TABS
100.0000 mg | ORAL_TABLET | Freq: Every day | ORAL | Status: DC
  Administered 2014-09-24 – 2014-09-26 (×3): 100 mg via ORAL
  Filled 2014-09-24 (×3): qty 4

## 2014-09-24 MED ORDER — HEPARIN SODIUM (PORCINE) 5000 UNIT/ML IJ SOLN
5000.0000 [IU] | Freq: Three times a day (TID) | INTRAMUSCULAR | Status: DC
  Administered 2014-09-24 – 2014-09-27 (×8): 5000 [IU] via SUBCUTANEOUS
  Filled 2014-09-24 (×8): qty 1

## 2014-09-24 MED ORDER — ACETAMINOPHEN 325 MG PO TABS
ORAL_TABLET | ORAL | Status: AC
Start: 2014-09-24 — End: 2014-09-24
  Administered 2014-09-24: 650 mg via ORAL
  Filled 2014-09-24: qty 2

## 2014-09-24 MED ORDER — INSULIN ASPART 100 UNIT/ML ~~LOC~~ SOLN: 0.0000 [IU] | Freq: Every day | SUBCUTANEOUS | Status: DC

## 2014-09-24 MED ORDER — ROSUVASTATIN CALCIUM 20 MG PO TABS
20.0000 mg | ORAL_TABLET | Freq: Every day | ORAL | Status: DC
  Administered 2014-09-24 – 2014-09-26 (×3): 20 mg via ORAL
  Filled 2014-09-24 (×3): qty 1

## 2014-09-24 MED ORDER — MIDODRINE HCL 5 MG PO TABS: 5.0000 mg | ORAL_TABLET | Freq: Three times a day (TID) | ORAL | Status: DC | PRN

## 2014-09-24 MED ORDER — DOCUSATE SODIUM 100 MG PO CAPS: 100.0000 mg | ORAL_CAPSULE | Freq: Two times a day (BID) | ORAL | Status: DC | PRN

## 2014-09-24 MED ORDER — ROPINIROLE HCL 1 MG PO TABS
0.5000 mg | ORAL_TABLET | Freq: Every day | ORAL | Status: DC
  Administered 2014-09-24 – 2014-09-26 (×3): 0.5 mg via ORAL
  Filled 2014-09-24 (×3): qty 1

## 2014-09-24 MED ORDER — ACETAMINOPHEN 325 MG PO TABS
650.0000 mg | ORAL_TABLET | Freq: Once | ORAL | Status: AC
  Administered 2014-09-24: 650 mg via ORAL

## 2014-09-24 MED ORDER — INSULIN ASPART 100 UNIT/ML ~~LOC~~ SOLN
0.0000 [IU] | Freq: Three times a day (TID) | SUBCUTANEOUS | Status: DC
  Administered 2014-09-25: 3 [IU] via SUBCUTANEOUS
  Administered 2014-09-25: 1 [IU] via SUBCUTANEOUS
  Administered 2014-09-25: 2 [IU] via SUBCUTANEOUS
  Administered 2014-09-26: 3 [IU] via SUBCUTANEOUS
  Administered 2014-09-26: 2 [IU] via SUBCUTANEOUS
  Filled 2014-09-24: qty 1
  Filled 2014-09-24: qty 2
  Filled 2014-09-24: qty 3
  Filled 2014-09-24: qty 2
  Filled 2014-09-24: qty 3

## 2014-09-24 MED ORDER — VITAMIN B-12 100 MCG PO TABS
100.0000 ug | ORAL_TABLET | Freq: Every day | ORAL | Status: DC
Start: 2014-09-24 — End: 2014-09-27
  Administered 2014-09-25 – 2014-09-27 (×3): 100 ug via ORAL
  Filled 2014-09-24 (×3): qty 1

## 2014-09-24 MED ORDER — ESCITALOPRAM OXALATE 10 MG PO TABS
10.0000 mg | ORAL_TABLET | Freq: Every day | ORAL | Status: DC
  Administered 2014-09-24 – 2014-09-27 (×4): 10 mg via ORAL
  Filled 2014-09-24 (×4): qty 1

## 2014-09-24 MED ORDER — SENNA 8.6 MG PO TABS: 1.0000 | ORAL_TABLET | Freq: Every day | ORAL | Status: DC | PRN

## 2014-09-24 NOTE — Telephone Encounter (Signed)
Please call him back, needs to stay hydrated, if symptoms persists contact cardiologist

## 2014-09-24 NOTE — Telephone Encounter (Signed)
Per Patient wife 4:30 pressure 63/43 and she may end up taking him to ER if she doesn't hear back from [place] before 5

## 2014-09-24 NOTE — Telephone Encounter (Signed)
Patient wife states her husband called at 3 p.m. And his BP was 66/44, he is getting dizzy 5 mg of Proamatine, then 3:45 p.m. It was up to 99/78. Patient has been drinking tons of water and Gatorade due to being dehydrated.

## 2014-09-24 NOTE — ED Notes (Signed)
Wife reports pt seen here recently and admitted for hypotension. Wife reports pt's BP at 1500 was 66/44. Wife reports gave pt midodrine and pt's BP was 99/78 at 1545. Pt reports lightheaded and dizziness. Wife reports she called PCP and Cardiologist and was told to bring him here. Pt also started back on carvedilol on Friday. Wife also reports pt vomited last night from 2200 until 0800 this morning. Pt reports headache, neck pain and epigastric pressure.

## 2014-09-24 NOTE — ED Notes (Signed)
Patient was throwing up last night until this AM. Wife states that BP was low since yesterday afternoon but much lower today. Wife tried to hydrate him today. States cardiologist stated to come here due to low BP.

## 2014-09-24 NOTE — Telephone Encounter (Signed)
Pt c/o BP issue: STAT if pt c/o blurred vision, one-sided weakness or slurred speech  1. What are your last 5 BP readings? 3 pm today 66/44 took proamitine 5mg  3:45 pm 99/78  Sat up on couch 4:15 pm L 82/56 R 65/48   2. Are you having any other symptoms (ex. Dizziness, headache, blurred vision, passed out)? Dizzy, headache, sun light sensitive , n & V 10 pm-8 am this morning Pushed fluids   3. What is your BP issue? Please call patient wife should she take to urgent care or er?

## 2014-09-24 NOTE — Telephone Encounter (Signed)
S/w pt wife, Angelique Blonder, who states pt has been vomiting since last night, BP low (readings listed in prior phone note), dizzy and BP drops when sitting up. Advised pt wife to take him to ER to be seen. Wife verbalized understanding and states she will take him now.

## 2014-09-24 NOTE — ED Provider Notes (Signed)
[facility] Emergency Department Provider Note    ____________________________________________  Time seen: 60  I have reviewed the triage vital signs and the nursing notes.   HISTORY  Chief Complaint Hypotension and Emesis   History limited by: Not Limited   HPI Angel Cruz is a 59 y.o. male who presents to the emergency department today because of concerns for hypotension. The patient noted a blood pressure of systolic in the 60s today. He does have symptoms of headache, neck pain and dizziness with his low blood pressure. He took 5 mg midarone which did help elevate his blood pressure. Unfortunately the patient does have a recent history of hard to control blood pressure.He had a recent admission to the hospital for low blood pressure. It was at this admission that they started the Mandarin. His wife states that they restarted his carvedilol at the end of last week and she thinks this might be partly to blame for the hypotension today. In addition the patient spent a lot of time outside the past weekend and it is unclear if he has been drinking enough. He has only urinated twice today.     Past Medical History  Diagnosis Date  . Diabetes mellitus without complication   . Hypertension   . Hyperlipidemia   . Depression   . Rotator cuff tear   . CKD (chronic kidney disease), stage III   . Ischemic cardiomyopathy     a. 10/2013 Echo: EF 35-40%, severe distal anteroseptal, anterior, and apical HK. Diast dysfxn, mild conc LVH, mildly dil LA, mild Ao sclerosis w/o stenosis.  Marland Kitchen CAD (coronary artery disease)     a. 10/2013 Lexi MV: EF 31%, apical, septal, ant-apical, inf-apical, lat-apical scar, septal and apical peri-infarct ischemia;  b. 10/2013 Cath: LM nl, LAD 20ost, 173m (faint R->L collats), D1 80p, LCX min irregs, OM1 min irregs, OM2 nl, OM3 30p, RCA 20p, PDA 60, RPL min irregs-->Med Rx.  . Morbid obesity   . CHF (congestive heart failure)   . Depression    . OSA (obstructive sleep apnea)   . Sleep apnea     recent test confirmed diagnosis, was performed at the sleep center across the street  . Hypotension     Patient Active Problem List   Diagnosis Date Noted  . Anemia 09/14/2014  . Coronary artery disease involving native coronary artery of native heart without angina pectoris   . Cardiomyopathy, ischemic   . Orthostatic hypotension 09/07/2014  . Hyperkalemia 09/07/2014  . Angioedema 07/30/2014  . RLS (restless legs syndrome) 07/30/2014  . B12 deficiency 07/30/2014  . Vitamin D deficiency 07/30/2014  . Chronic systolic heart failure 11/14/2013  . Ischemic cardiomyopathy   . CAD (coronary artery disease)   . Hypertension   . Hyperlipidemia   . 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 Depression   . CKD (chronic kidney disease), stage III     Past Surgical History  Procedure Laterality Date  . Cardiac catheterization  10/19/2013    [facility]  . Uvulectomy  1995  . Atrial fibrillation ablation  1995  . Elbow surgery Left   . Rotator cuff repair    . Carpal tunnel release      Current Outpatient Rx  Name  Route  Sig  Dispense  Refill  . aspirin EC 81 MG tablet   Oral   Take 81 mg by mouth daily.         . carvedilol (COREG) 3.125 MG tablet  Oral   Take 1 tablet (3.125 mg total) by mouth 2 (two) times daily with a meal.   60 tablet   3   . Cyanocobalamin (VITAMIN B-12 PO)   Oral   Take 1 tablet by mouth daily.         Marland Kitchen escitalopram (LEXAPRO) 10 MG tablet   Oral   Take 1 tablet (10 mg total) by mouth daily.   30 tablet   1   . Insulin Degludec (TRESIBA FLEXTOUCH) 200 UNIT/ML SOPN   Subcutaneous   Inject 10 Units into the skin daily.   5 pen   2   . metFORMIN (GLUCOPHAGE) 850 MG tablet   Oral   Take 850 mg by mouth 2 (two) times daily with a meal.         . midodrine (PROAMATINE) 10 MG tablet   Oral   Take 0.5 tablets (5 mg total) by mouth 3 (three) times daily as needed.    120 tablet   1   . nitroGLYCERIN (NITROSTAT) 0.4 MG SL tablet   Sublingual   Place 0.4 mg under the tongue every 5 (five) minutes as needed for chest pain.         Marland Kitchen QUEtiapine (SEROQUEL) 100 MG tablet   Oral   Take 1 tablet (100 mg total) by mouth at bedtime.   30 tablet   0   . rOPINIRole (REQUIP) 0.5 MG tablet   Oral   Take 1 tablet (0.5 mg total) by mouth at bedtime.   30 tablet   5   . rosuvastatin (CRESTOR) 20 MG tablet   Oral   Take 1 tablet (20 mg total) by mouth at bedtime.   30 tablet   0     Allergies Review of patient's allergies indicates no known allergies.  Family History  Problem Relation Age of Onset  . Stroke Mother   . COPD Mother   . Heart failure Mother   . Heart attack Mother   . Colon cancer Father   . Diabetes Father   . Diabetes Brother   . Cancer Brother     Social History Social History  Substance Use Topics  . Smoking status: Never Smoker   . Smokeless tobacco: Never Used  . Alcohol Use: No    Review of Systems  Constitutional: Negative for fever. Cardiovascular: Negative for chest pain. Respiratory: Negative for shortness of breath. Gastrointestinal: Negative for abdominal pain, vomiting and diarrhea. Genitourinary: Decreased urination Musculoskeletal: Negative for back pain. Skin: Negative for rash. Neurological: Positive for headache  10-point ROS otherwise negative.  ____________________________________________   PHYSICAL EXAM:  VITAL SIGNS: ED Triage Vitals  Enc Vitals Group     BP 09/24/14 1739 118/78 mmHg     Pulse Rate 09/24/14 1739 82     Resp 09/24/14 1739 16     Temp 09/24/14 1739 98.4 F (36.9 C)     Temp Source 09/24/14 1739 Oral     SpO2 09/24/14 1739 99 %     Weight 09/24/14 1739 207 lb (93.895 kg)     Height 09/24/14 1739  (1.702 m)     Head Cir --      Peak Flow --      Pain Score 09/24/14 1740 8   Constitutional: Alert and oriented. Well appearing and in no distress. Eyes:  Conjunctivae are normal. PERRL. Normal extraocular movements. ENT   Head: Normocephalic and atraumatic.   Nose: No congestion/rhinnorhea.   Mouth/Throat: Mucous membranes are  moist.   Neck: No stridor. Hematological/Lymphatic/Immunilogical: No cervical lymphadenopathy. Cardiovascular: Normal rate, regular rhythm.  No murmurs, rubs, or gallops. Respiratory: Normal respiratory effort without tachypnea nor retractions. Breath sounds are clear and equal bilaterally. No wheezes/rales/rhonchi. Gastrointestinal: Soft and nontender. No distention.  Genitourinary: Deferred Musculoskeletal: Normal range of motion in all extremities. No joint effusions.  No lower extremity tenderness nor edema. Neurologic:  Normal speech and language. No gross focal neurologic deficits are appreciated. Speech is normal.  Skin:  Skin is warm, dry and intact. No rash noted. Psychiatric: Mood and affect are normal. Speech and behavior are normal. Patient exhibits appropriate insight and judgment.  ____________________________________________    LABS (pertinent positives/negatives)  Labs Reviewed  BASIC METABOLIC PANEL - Abnormal; Notable for the following:    Glucose, Bld 189 (*)    BUN 69 (*)    Creatinine, Ser 4.03 (*)    GFR calc non Af Amer 15 (*)    GFR calc Af Amer 18 (*)    All other components within normal limits  CBC - Abnormal; Notable for the following:    RBC 4.32 (*)    Hemoglobin 12.3 (*)    HCT 37.7 (*)    All other components within normal limits  URINALYSIS COMPLETEWITH MICROSCOPIC ([facility] ONLY)     ____________________________________________   EKG  I, Phineas Semen, attending physician, personally viewed and interpreted this EKG  EKG Time: 1745 Rate: 80 Rhythm: sinus rhythm Axis: normal Intervals: qtc 470 QRS: narrow ST changes: no st elevation  ____________________________________________     RADIOLOGY  None  ____________________________________________   PROCEDURES  Procedure(s) performed: None  Critical Care performed: No  ____________________________________________   INITIAL IMPRESSION / ASSESSMENT AND PLAN / ED COURSE  Pertinent labs & imaging results that were available during my care of the patient were reviewed by me and considered in my medical decision making (see chart for details).  Patient presented to the emergency department today after concerns for episodes of hypotension. The patient was orthostatic here. In addition patient's creatinine was elevated. Will admit for acute kidney injury and hypotension.   ____________________________________________   FINAL CLINICAL IMPRESSION(S) / ED DIAGNOSES  Final diagnoses:  AKI (acute kidney injury)  Hypotension, unspecified hypotension type     Phineas Semen, MD 09/24/14 2009

## 2014-09-24 NOTE — H&P (Signed)
[facility] - [place] at [facility]   PATIENT NAME: Angel Cruz    MR#:  [id number]  DATE OF BIRTH:  04/17/1955  DATE OF ADMISSION:  09/24/2014  PRIMARY CARE PHYSICIAN: Ruel Favors, MD   REQUESTING/REFERRING PHYSICIAN: Dr. Derrill Kay  CHIEF COMPLAINT:   Chief Complaint  Patient presents with  . Hypotension  . Emesis    HISTORY OF PRESENT ILLNESS:  Angel Cruz  is a 59 y.o. male with a known history of insulin-dependent diabetes mellitus, coronary artery disease, ischemic cardiomyopathy with last known ejection fraction of 35-40%, CK D stage III who was just in the hospital 2 weeks ago for hypotension and was discharged on Methergine comes back again for hypotension. Also noted to have acute renal failure today. Patient started to feel dizzy and weak yesterday, was in bed most of the day. This morning took his blood pressure at home and  was noted to be hypotensive. He called his PCPs office and also his cardiologist's office who recommended him to come to the hospital. He does have orthostatic hypotension. He was discharged on Methergine for this same reason. All his blood pressure medications were stopped during the last admission but his Coreg was just restarted about 3 days ago at a very low-dose of 3.125 mg. His wife feels like his symptoms might have started back once the Coreg was started on. He has urinated very little in the past 24 hours. He is also noted to have acute renal failure on labs with his creatinine worsening to 4 today. Patient denies any chest pain and difficulty breathing. No nausea vomiting or diarrhea.   PAST MEDICAL HISTORY:   Past Medical History  Diagnosis Date  . Diabetes mellitus without complication   . Hypertension   . Hyperlipidemia   . Depression   . Rotator cuff tear   . CKD (chronic kidney disease), stage III   . Ischemic cardiomyopathy     a. 10/2013 Echo: EF 35-40%, severe distal anteroseptal, anterior, and apical  HK. Diast dysfxn, mild conc LVH, mildly dil LA, mild Ao sclerosis w/o stenosis.  Marland Kitchen CAD (coronary artery disease)     a. 10/2013 Lexi MV: EF 31%, apical, septal, ant-apical, inf-apical, lat-apical scar, septal and apical peri-infarct ischemia;  b. 10/2013 Cath: LM nl, LAD 20ost, 163m (faint R->L collats), D1 80p, LCX min irregs, OM1 min irregs, OM2 nl, OM3 30p, RCA 20p, PDA 60, RPL min irregs-->Med Rx.  . Morbid obesity   . CHF (congestive heart failure)   . Depression   . OSA (obstructive sleep apnea)   . Sleep apnea     recent test confirmed diagnosis, was performed at the sleep center across the street  . Hypotension     PAST SURGICAL HISTORY:   Past Surgical History  Procedure Laterality Date  . Cardiac catheterization  10/19/2013    [facility]  . Uvulectomy  1995  . Atrial fibrillation ablation  1995  . Elbow surgery Left   . Rotator cuff repair    . Carpal tunnel release      SOCIAL HISTORY:   Social History  Substance Use Topics  . Smoking status: Never Smoker   . Smokeless tobacco: Never Used  . Alcohol Use: No    FAMILY HISTORY:   Family History  Problem Relation Age of Onset  . Stroke Mother   . COPD Mother   . Heart failure Mother   . Heart attack Mother   . Colon cancer Father   .  Diabetes Father   . Diabetes Brother   . Cancer Brother     DRUG ALLERGIES:  No Known Allergies  REVIEW OF SYSTEMS:   Review of Systems  Constitutional: Negative for fever, chills, weight loss and malaise/fatigue.  HENT: Negative for ear discharge, ear pain, hearing loss, nosebleeds and tinnitus.   Eyes: Negative for blurred vision, double vision and photophobia.  Respiratory: Negative for cough, hemoptysis, shortness of breath and wheezing.   Cardiovascular: Negative for chest pain, palpitations, orthopnea and leg swelling.  Gastrointestinal: Negative for heartburn, nausea, vomiting, abdominal pain, diarrhea, constipation and melena.  Genitourinary: Negative for dysuria,  urgency and hematuria.       Decreased frequency of urination noted.  Musculoskeletal: Negative for myalgias, back pain and neck pain.  Skin: Negative for rash.  Neurological: Positive for dizziness and headaches. Negative for tingling, tremors, sensory change, speech change and focal weakness.  Endo/Heme/Allergies: Does not bruise/bleed easily.  Psychiatric/Behavioral: Negative for depression.    MEDICATIONS AT HOME:   Prior to Admission medications   Medication Sig Start Date End Date Taking? Authorizing Provider  aspirin EC 81 MG tablet Take 81 mg by mouth daily.   Yes Historical Provider, MD  carvedilol (COREG) 3.125 MG tablet Take 1 tablet (3.125 mg total) by mouth 2 (two) times daily with a meal. 09/20/14  Yes Iran Ouch, MD  Cyanocobalamin (VITAMIN B-12 PO) Take 1 tablet by mouth daily.   Yes Historical Provider, MD  escitalopram (LEXAPRO) 10 MG tablet Take 1 tablet (10 mg total) by mouth daily. 09/14/14  Yes Alba Cory, MD  Insulin Degludec (TRESIBA FLEXTOUCH) 200 UNIT/ML SOPN Inject 10 Units into the skin daily. 08/06/14  Yes Alba Cory, MD  metFORMIN (GLUCOPHAGE) 850 MG tablet Take 850 mg by mouth 2 (two) times daily with a meal.   Yes Historical Provider, MD  midodrine (PROAMATINE) 10 MG tablet Take 0.5 tablets (5 mg total) by mouth 3 (three) times daily as needed. Patient taking differently: Take 5 mg by mouth 3 (three) times daily as needed (for low BP).  09/14/14  Yes Alba Cory, MD  nitroGLYCERIN (NITROSTAT) 0.4 MG SL tablet Place 0.4 mg under the tongue every 5 (five) minutes as needed for chest pain.   Yes Historical Provider, MD  QUEtiapine (SEROQUEL) 100 MG tablet Take 1 tablet (100 mg total) by mouth at bedtime. 09/14/14  Yes Alba Cory, MD  rOPINIRole (REQUIP) 0.5 MG tablet Take 1 tablet (0.5 mg total) by mouth at bedtime. 07/30/14  Yes Alba Cory, MD  rosuvastatin (CRESTOR) 20 MG tablet Take 1 tablet (20 mg total) by mouth at bedtime. 09/14/14  Yes Alba Cory, MD      VITAL SIGNS:  Blood pressure 156/94, pulse 65, temperature 98.4 F (36.9 C), temperature source Oral, resp. rate 16, height 5\' 7"  (1.702 m), weight 93.895 kg (207 lb), SpO2 100 %.  PHYSICAL EXAMINATION:   Physical Exam  GENERAL:  59 y.o.-year-old patient lying in the bed with no acute distress.  EYES: Pupils equal, round, reactive to light and accommodation. Right pupil is postsurgical. No scleral icterus. Extraocular muscles intact.  HEENT: Head atraumatic, normocephalic. Oropharynx and nasopharynx clear.  NECK:  Supple, no jugular venous distention. No thyroid enlargement, no tenderness.  LUNGS: Normal breath sounds bilaterally, no wheezing, rales,rhonchi or crepitation. No use of accessory muscles of respiration.  CARDIOVASCULAR: S1, S2 normal. No murmurs, rubs, or gallops.  ABDOMEN: Soft, nontender, nondistended. Bowel sounds present. No organomegaly or mass.  EXTREMITIES: No pedal  edema, cyanosis, or clubbing. Limited movement at left elbow due to previous surgery. Decreased sensation and grip of the left hand due to above. This is chronic. NEUROLOGIC: Cranial nerves II through XII are intact. Muscle strength 5/5 in all extremities. Sensation intact. Gait not checked.  PSYCHIATRIC: The patient is alert and oriented x 3.  SKIN: No obvious rash, lesion, or ulcer.   LABORATORY PANEL:   CBC  Recent Labs Lab 09/24/14 1756  WBC 7.6  HGB 12.3*  HCT 37.7*  PLT 202   ------------------------------------------------------------------------------------------------------------------  Chemistries   Recent Labs Lab 09/24/14 1756  NA 136  K 4.2  CL 101  CO2 23  GLUCOSE 189*  BUN 69*  CREATININE 4.03*  CALCIUM 9.9   ------------------------------------------------------------------------------------------------------------------  Cardiac Enzymes No results for input(s): TROPONINI in the last 168  hours. ------------------------------------------------------------------------------------------------------------------  RADIOLOGY:  No results found.  EKG:   Orders placed or performed during the hospital encounter of 09/24/14  . ED EKG  . ED EKG    IMPRESSION AND PLAN:   Angel Cruz  is a 59 y.o. male with a known history of insulin-dependent diabetes mellitus, coronary artery disease, ischemic cardiomyopathy with last known ejection fraction of 35-40%, CK D stage III who was just in the hospital 2 weeks ago for hypotension and was discharged on Methergine comes back again for hypotension. Also noted to have acute renal failure today.  #1 acute renal failure-known history of stage III CK D with baseline creatinine around 1.6, worsened up to 4. -BUN increased up to 69. -Likely prerenal and ATN from hypotension and dehydration. -Gentle hydration with IV fluids at 75 cc/h. Watch for any symptoms of congestive heart failure. -Renal ultrasound ordered, strict I&O ordered. -Nephrology consultation requested. No acute indication for dialysis.  #2 hypotension-orthostatic hypotension identified during last hospital admission. Continue midodrine at this time. Gentle hydration for his dehydration symptoms.  #3 congestive heart failure-ischemic cardiomyopathy, last ejection fraction of 35-40%. -Currently dehydrated so IV fluids are being given. -Watch for any signs of fluid overload. Consult cardiology. All his cardiac medications are on hold due to hypotension. discontinue Coreg.  #4 insulin-dependent diabetes mellitus-continue home insulin. Sliding scale insulin will be ordered.  #5 DVT prophylaxis-subcutaneous heparin ordered.  All the records are reviewed and case discussed with ED provider. Management plans discussed with the patient, family and they are in agreement.  CODE STATUS: Full code  TOTAL TIME TAKING CARE OF THIS PATIENT: 55 minutes.    Enid Baas M.D on  09/24/2014 at 8:19 PM  Between 7am to 6pm - Pager - [phone]  After 6pm go to [email or web address] - password [email or web address] [facility]  [facility]  Office  [phone]  CC: Primary care physician; Ruel Favors, MD

## 2014-09-25 ENCOUNTER — Inpatient Hospital Stay: Payer: Medicaid Other

## 2014-09-25 ENCOUNTER — Inpatient Hospital Stay (HOSPITAL_COMMUNITY)
Admit: 2014-09-25 | Discharge: 2014-09-25 | Disposition: A | Discharge status: Home / Self Care | Payer: Medicaid Other | Attending: Internal Medicine | Admitting: Internal Medicine

## 2014-09-25 DIAGNOSIS — N179 Acute kidney failure, unspecified: Secondary | ICD-10-CM

## 2014-09-25 DIAGNOSIS — I509 Heart failure, unspecified: Secondary | ICD-10-CM

## 2014-09-25 DIAGNOSIS — I951 Orthostatic hypotension: Secondary | ICD-10-CM

## 2014-09-25 DIAGNOSIS — I5022 Chronic systolic (congestive) heart failure: Secondary | ICD-10-CM

## 2014-09-25 DIAGNOSIS — R0600 Dyspnea, unspecified: Secondary | ICD-10-CM

## 2014-09-25 LAB — GLUCOSE, CAPILLARY
GLUCOSE-CAPILLARY: 156 mg/dL — AB (ref 65–99)
Glucose-Capillary: 142 mg/dL — ABNORMAL HIGH (ref 65–99)
Glucose-Capillary: 205 mg/dL — ABNORMAL HIGH (ref 65–99)
Glucose-Capillary: 205 mg/dL — ABNORMAL HIGH (ref 65–99)

## 2014-09-25 LAB — CBC
HEMATOCRIT: 33.1 % — AB (ref 40.0–52.0)
HEMOGLOBIN: 11.3 g/dL — AB (ref 13.0–18.0)
MCH: 29.6 pg (ref 26.0–34.0)
MCHC: 34.2 g/dL (ref 32.0–36.0)
MCV: 86.5 fL (ref 80.0–100.0)
Platelets: 159 10*3/uL (ref 150–440)
RBC: 3.83 MIL/uL — ABNORMAL LOW (ref 4.40–5.90)
RDW: 13.4 % (ref 11.5–14.5)
WBC: 6.2 10*3/uL (ref 3.8–10.6)

## 2014-09-25 LAB — BASIC METABOLIC PANEL
ANION GAP: 8 (ref 5–15)
BUN: 64 mg/dL — ABNORMAL HIGH (ref 6–20)
CHLORIDE: 106 mmol/L (ref 101–111)
CO2: 24 mmol/L (ref 22–32)
Calcium: 9 mg/dL (ref 8.9–10.3)
Creatinine, Ser: 3.36 mg/dL — ABNORMAL HIGH (ref 0.61–1.24)
GFR calc Af Amer: 22 mL/min — ABNORMAL LOW (ref >60)
GFR, EST NON AFRICAN AMERICAN: 19 mL/min — AB (ref >60)
GLUCOSE: 183 mg/dL — AB (ref 65–99)
Potassium: 4.1 mmol/L (ref 3.5–5.1)
Sodium: 138 mmol/L (ref 135–145)

## 2014-09-25 NOTE — Progress Notes (Signed)
Initial Nutrition Assessment   INTERVENTION:   Meals and Snacks: Cater to patient preferences; recommend diabetic, low sodium diet Education: reviewed/reinforced diabetic, low sodium diet; pt and wife were educated on recent admission by this Clinical research associate. In fact, pt's wife had the written material with her from last admission. Answered all questions.   NUTRITION DIAGNOSIS:   Food and nutrition related knowledge deficit related to chronic illness as evidenced by  (reviewed/reinforced diet, answered questions regarding diet).   GOAL:   Patient will meet greater than or equal to 90% of their needs  MONITOR:    (Energy Intake, Anthropometrics, Electrolyte/Renal Profile, Glucose Profile)  REASON FOR ASSESSMENT:   Diagnosis    ASSESSMENT:    Pt admitted with N/V, recurrent syncope, hypotension with ARF  Past Medical History  Diagnosis Date  . Diabetes mellitus without complication   . Hypertension   . Hyperlipidemia   . Depression   . Rotator cuff tear   . CKD (chronic kidney disease), stage III   . Ischemic cardiomyopathy     a. 10/2013 Echo: EF 35-40%, severe distal anteroseptal, anterior, and apical HK. Diast dysfxn, mild conc LVH, mildly dil LA, mild Ao sclerosis w/o stenosis.  Marland Kitchen CAD (coronary artery disease)     a. 10/2013 Lexi MV: EF 31%, apical, septal, ant-apical, inf-apical, lat-apical scar, septal and apical peri-infarct ischemia;  b. 10/2013 Cath: LM nl, LAD 20ost, 161m (faint R->L collats), D1 80p, LCX min irregs, OM1 min irregs, OM2 nl, OM3 30p, RCA 20p, PDA 60, RPL min irregs-->Med Rx.  . Morbid obesity   . CHF (congestive heart failure)   . Depression   . OSA (obstructive sleep apnea)   . Sleep apnea     recent test confirmed diagnosis, was performed at the sleep center across the street  . Hypotension     Diet Order:  Diet renal with fluid restriction Room service appropriate?: Yes; Fluid consistency:: Thin   Energy Intake: recorded po intake 80-100% of meals,  appetite good since admission.   Food and Nutrition Related History: pt reports decreased appetite since last admission, wife prepares all meals. Pt eats at least breakfast and dinner meals.   Electrolyte and Renal Profile:  Recent Labs Lab 09/24/14 1756 09/25/14 0448  BUN 69* 64*  CREATININE 4.03* 3.36*  NA 136 138  K 4.2 4.1   Glucose Profile:  Recent Labs  09/24/14 2218 09/25/14 0745 09/25/14 1158  GLUCAP 75 142* 205*   Protein Profile: No results for input(s): ALBUMIN in the last 168 hours.   Meds: NS at 75 ml/hr, ss novolog, tresiba, midodrine, Vitamin B12  Skin:  Reviewed, no issues  Last BM:  8/13  Height:   Ht Readings from Last 1 Encounters:  09/24/14 5\' 7"  (1.702 m)    Weight:   Wt Readings from Last 1 Encounters:  09/24/14 210 lb (95.255 kg)    Wt Readings from Last 10 Encounters:  09/24/14 210 lb (95.255 kg)  09/20/14 216 lb (97.977 kg)  09/14/14 218 lb 6.4 oz (99.066 kg)  09/09/14 119 lb 8 oz (54.205 kg)  09/07/14 214 lb 8 oz (97.297 kg)  09/06/14 213 lb (96.616 kg)  08/16/14 216 lb (97.977 kg)  08/15/14 216 lb (97.977 kg)  08/06/14 221 lb 4.8 oz (100.381 kg)  07/30/14 216 lb 11.2 oz (98.294 kg)    BMI:  Body mass index is 32.88 kg/(m^2).   LOW Care Level  Romelle Starcher MS, [place], LDN [phone] Pager

## 2014-09-25 NOTE — Progress Notes (Signed)
[facility] [place] [facility]                                                                                                                                                                                            Patient Demographics   Angel Cruz, is a 59 y.o. male, DOB - Jan 06, 1956, ZOX:[id number]  Admit date - 09/24/2014   Admitting Physician Enid Baas, MD  Outpatient Primary MD for the patient is Ruel Favors, MD   LOS - 1  Subjective: Patient admitted with recurrent syncope , hypotension and acute renal failure. He feels better with IV hydration     Review of Systems:   CONSTITUTIONAL: No documented fever. No fatigue, weakness. No weight gain, no weight loss.  EYES: No blurry or double vision.  ENT: No tinnitus. No postnasal drip. No redness of the oropharynx.  RESPIRATORY: No cough, no wheeze, no hemoptysis. No dyspnea.  CARDIOVASCULAR: No chest pain. No orthopnea. No palpitations. Recurrent syncope.  GASTROINTESTINAL: No nausea, no vomiting or diarrhea. No abdominal pain. No melena or hematochezia.  GENITOURINARY: No dysuria or hematuria.  ENDOCRINE: No polyuria or nocturia. No heat or cold intolerance.  HEMATOLOGY: No anemia. No bruising. No bleeding.  INTEGUMENTARY: No rashes. No lesions.  MUSCULOSKELETAL: No arthritis. No swelling. No gout.  NEUROLOGIC: No numbness, tingling, or ataxia. No seizure-type activity.  PSYCHIATRIC: No anxiety. No insomnia. No ADD.    Vitals:   Filed Vitals:   09/25/14 0150 09/25/14 0438 09/25/14 0805 09/25/14 1159  BP: 135/81 107/72 121/73 137/85  Pulse: 72 70  65  Temp:  97.9 F (36.6 C)  97.8 F (36.6 C)  TempSrc:  Oral    Resp:  18  18  Height:      Weight:      SpO2:  97%  99%    Wt Readings from Last 3 Encounters:  09/24/14 95.255 kg (210 lb)  09/20/14 97.977 kg (216 lb)  09/14/14 99.066 kg (218 lb 6.4 oz)     Intake/Output Summary (Last 24 hours) at 09/25/14 1444 Last  data filed at 09/25/14 1153  Gross per 24 hour  Intake 998.75 ml  Output    950 ml  Net  48.75 ml    Physical Exam:   GENERAL: Pleasant-appearing in no apparent distress.  HEAD, EYES, EARS, NOSE AND THROAT: Atraumatic, normocephalic. Extraocular muscles are intact. Pupils equal and reactive to light. Sclerae anicteric. No conjunctival injection. No oro-pharyngeal erythema.  NECK: Supple. There is no jugular venous distention. No bruits, no lymphadenopathy, no thyromegaly.  HEART: Regular rate and rhythm,. No murmurs, no rubs, no  clicks.  LUNGS: Clear to auscultation bilaterally. No rales or rhonchi. No wheezes.  ABDOMEN: Soft, flat, nontender, nondistended. Has good bowel sounds. No hepatosplenomegaly appreciated.  EXTREMITIES: No evidence of any cyanosis, clubbing, or peripheral edema.  +2 pedal and radial pulses bilaterally.  NEUROLOGIC: The patient is alert, awake, and oriented x3 with no focal motor or sensory deficits appreciated bilaterally.  SKIN: Moist and warm with no rashes appreciated.  Psych: Not anxious, depressed LN: No inguinal LN enlargement    Antibiotics   Anti-infectives    None      Medications   Scheduled Meds: . aspirin EC  81 mg Oral Daily  . escitalopram  10 mg Oral Daily  . heparin  5,000 Units Subcutaneous 3 times per day  . insulin aspart  0-5 Units Subcutaneous QHS  . insulin aspart  0-9 Units Subcutaneous TID WC  . Insulin Degludec  10 Units Subcutaneous Q2200  . QUEtiapine  100 mg Oral QHS  . rOPINIRole  0.5 mg Oral QHS  . rosuvastatin  20 mg Oral QHS  . vitamin B-12  100 mcg Oral Daily   Continuous Infusions: . sodium chloride 75 mL/hr at 09/25/14 1152   PRN Meds:.acetaminophen **OR** acetaminophen, docusate sodium, midodrine, nitroGLYCERIN, senna   Data Review:   Micro Results No results found for this or any previous visit (from the past 240 hour(s)).  Radiology Reports US Renal  09/25/2014   CLINICAL DATA:  Acute renal failure,  history of chronic renal insufficiency, CHF  EXAM: RENAL / URINARY TRACT ULTRASOUND COMPLETE  COMPARISON:  Report of renal ultrasound dated August 01, 1998  FINDINGS: Right Kidney:  Length: 11.8 cm. Echogenicity within normal limits. No mass or hydronephrosis visualized.  Left Kidney:  Length: 12.1 cm. Echogenicity within normal limits. No mass or hydronephrosis visualized. There is an upper pole cyst measuring 1.5 cm in greatest dimension. This has been previously described.  Bladder:  The urinary bladder is adequately distended. There is echogenic debris floating in the urine pool. Bilateral ureteral jets are demonstrated.  IMPRESSION: 1. The renal cortical echotexture is normal. 2. There is no hydronephrosis. 3. There is a simple appearing cyst in the upper pole of the left kidney 4. Echogenic material is floating in the urinary bladder likely reflecting cellular debris.   Electronically Signed   By: David  [place] M.D.   On: 09/25/2014 13:01     CBC  Recent Labs Lab 09/24/14 1756 09/25/14 0448  WBC 7.6 6.2  HGB 12.3* 11.3*  HCT 37.7* 33.1*  PLT 202 159  MCV 87.4 86.5  MCH 28.6 29.6  MCHC 32.7 34.2  RDW 13.3 13.4    Chemistries   Recent Labs Lab 09/24/14 1756 09/25/14 0448  NA 136 138  K 4.2 4.1  CL 101 106  CO2 23 24  GLUCOSE 189* 183*  BUN 69* 64*  CREATININE 4.03* 3.36*  CALCIUM 9.9 9.0   ------------------------------------------------------------------------------------------------------------------ estimated creatinine clearance is 27 mL/min (by C-G formula based on Cr of 3.36). ------------------------------------------------------------------------------------------------------------------ No results for input(s): HGBA1C in the last 72 hours. ------------------------------------------------------------------------------------------------------------------ No results for input(s): CHOL, HDL, LDLCALC, TRIG, CHOLHDL, LDLDIRECT in the last 72  hours. ------------------------------------------------------------------------------------------------------------------ No results for input(s): TSH, T4TOTAL, T3FREE, THYROIDAB in the last 72 hours.  Invalid input(s): FREET3 ------------------------------------------------------------------------------------------------------------------ No results for input(s): VITAMINB12, FOLATE, FERRITIN, TIBC, IRON, RETICCTPCT in the last 72 hours.  Coagulation profile No results for input(s): INR, PROTIME in the last 168 hours.  No results for input(s): DDIMER in the last  72 hours.  Cardiac Enzymes No results for input(s): CKMB, TROPONINI, MYOGLOBIN in the last 168 hours.  Invalid input(s): CK ------------------------------------------------------------------------------------------------------------------ Invalid input(s): POCBNP    Assessment & Plan   Angel Cruz is a 59 y.o. male with a known history of insulin-dependent diabetes mellitus, coronary artery disease, ischemic cardiomyopathy with last known ejection fraction of 35-40%, CK D stage III who was just in the hospital 2 weeks ago for hypotension and was discharged on Methergine comes back again for hypotension. Also noted to have acute renal failure today.  #1 acute renal failure-known history of stage III CK D with baseline creatinine around 1.6, Due to hypotension and suspected ATN, await nephrology input IV fluids hold blood pressure medications   #2 hypotension-orthostatic hypotension identified during last hospital admission. Continue midodrine at this time. IV fluids Patient should have compression stockings on all the time Check a random cortisol level  #3 congestive heart failure-ischemic cardiomyopathy, last ejection fraction of 35-40%. -Currently dehydrated  Obtain echo, appreciate cardiology input consider advance congestive heart failure therapy treatment  #4 insulin-dependent diabetes mellitus-continue home  insulin. Sliding scale insulin   #5 DVT prophylaxis-subcutaneous heparin     Code Status Orders        Start     Ordered   09/24/14 2159  Full code   Continuous     09/24/14 2158           Consults cardiology  DVT Prophylaxis  heparin  Lab Results  Component Value Date   PLT 159 09/25/2014     Time Spent in minutes  35    Auburn Bilberry M.D on 09/25/2014 at 2:44 PM  Between 7am to 6pm - Pager - [phone]  After 6pm go to [email or web address] - password [email or web address] [facility]  [facility] [place]   Office  [phone]

## 2014-09-25 NOTE — Telephone Encounter (Signed)
Patient wife was notified and will contact cardiology

## 2014-09-25 NOTE — Consult Note (Addendum)
Cardiology Consultation Note  Patient ID: Angel Cruz, MRN: [id number], DOB/AGE: 57-Aug-1957 57 y.o. Admit date: 09/24/2014   Date of Consult: 09/25/2014 Primary Physician: Ruel Favors, MD Primary Cardiologist: Dr. Kirke Corin, MD  Chief Complaint: Dizziness/orthostasis in the setting of N/V Reason for Consult: Orthostatic hypotension   HPI: 59 year old male with history of CAD managed medically, ischemic cardiomyopathy, chronic systolic CHF, labile HTN, CKD stage III, HLD, DM2, morbid obesity, OSA, and depression who was recently seen in the [facility] ED on 7/28 for orthostasis and hyperkalemia, in clinic on 7/29 for the same, and admitted to [facility] the following day for the same presented to [facility] again on 8/15with dizziness and orthostasis in the setting of nausea and vomiting. Cardiology is consulted for further evaluation.   He was admitted to [facility] in September, 2015 with complaints of cough and chest pain.He ruled out for myocardial infarction.He underwent stress testing which revealed apical infarct with peri-infarct ischemia.EF was 31%. Echocardiogram showed EF 35-40% with anteroseptal, anterior, apical wall motion abnormalities. He underwent cardiac catheterization which revealed an occluded mid LAD with otherwise nonobstructive CAD.There were faint right to left collaterals supplying the distal LAD distribution. He has had recurrent problems with labile hypertension as well as episodes of fluid overload alternating with volume depletion whenever he is on a diuretic, without diaphoresis. He has had continued episodes of dizziness and low blood pressure with episodic visits to the emergency room for orthostatic hypotension. Generally his basic work up is negative. He recently presented to the ED on 7/28 for orthostasis and was found to be hyperkalemic with a K+ of 5.6, with a prior history of hyperkalemia. He was treated in the ED with IV fluids. He has been using a  powered cocktail for his water and there is some uncertainty if this is playing a role in his elevated potassium. He followed up in the office on 7/29 feeling somewhat dizzy and blood pressures running in the 90s/60s. He had recently been having several days of diarrhea of uncertain etiology. His Coreg was decreased from 6.25 bid to 3.125 mg bid.   Recent admission to [facility] from 7/31-8/2 for orthostasis. Started on midodrine 5 mg tid prn. His vital signs improves along with his symptoms. HF meds were discontinued. In hospital follow up he was doing well and only needing midodrine once daily. He was restarted on Coreg 3.125 mg bid.   He recently developed N/V on 8/14 with emesis almost all night. He had a difficult time keeping up with hydration. Because of this he became orthostatic. He was beginning to feel somewhat lightheaded earlier in the weekend, though this worsened with the starting of his N/V. He contacted our office and his PCP. He was advised to come to the ED. Upon his arrival to the ED he was found to have a BP of 118/78. His Coreg was held. His SCr was found to be 4.03-->3.36 (baseline 1.3 to 1.6). BUN 69. CBC stable. EKG showed NSR, 80 bpm, short PR, prolonged QT, inferolateral st/t changes. He was gently hydrated.         Past Medical History  Diagnosis Date  . Diabetes mellitus without complication   . Hypertension   . Hyperlipidemia   . Depression   . Rotator cuff tear   . CKD (chronic kidney disease), stage III   . Ischemic cardiomyopathy     a. 10/2013 Echo: EF 35-40%, severe distal anteroseptal, anterior, and apical HK. Diast dysfxn, mild conc LVH,  mildly dil LA, mild Ao sclerosis w/o stenosis.  Marland Kitchen CAD (coronary artery disease)     a. 10/2013 Lexi MV: EF 31%, apical, septal, ant-apical, inf-apical, lat-apical scar, septal and apical peri-infarct ischemia;  b. 10/2013 Cath: LM nl, LAD 20ost, 126m (faint R->L collats), D1 80p, LCX min irregs, OM1 min irregs, OM2 nl, OM3 30p, RCA 20p,  PDA 60, RPL min irregs-->Med Rx.  . Morbid obesity   . CHF (congestive heart failure)   . Depression   . OSA (obstructive sleep apnea)   . Sleep apnea     recent test confirmed diagnosis, was performed at the sleep center across the street  . Hypotension       Most Recent Cardiac Studies: As above   Surgical History:  Past Surgical History  Procedure Laterality Date  . Cardiac catheterization  10/19/2013    [facility]  . Uvulectomy  1995  . Atrial fibrillation ablation  1995  . Elbow surgery Left   . Rotator cuff repair    . Carpal tunnel release       Home Meds: Prior to Admission medications   Medication Sig Start Date End Date Taking? Authorizing Provider  aspirin EC 81 MG tablet Take 81 mg by mouth daily.   Yes Historical Provider, MD  carvedilol (COREG) 3.125 MG tablet Take 1 tablet (3.125 mg total) by mouth 2 (two) times daily with a meal. 09/20/14  Yes Iran Ouch, MD  Cyanocobalamin (VITAMIN B-12 PO) Take 1 tablet by mouth daily.   Yes Historical Provider, MD  escitalopram (LEXAPRO) 10 MG tablet Take 1 tablet (10 mg total) by mouth daily. 09/14/14  Yes Alba Cory, MD  Insulin Degludec (TRESIBA FLEXTOUCH) 200 UNIT/ML SOPN Inject 10 Units into the skin daily. 08/06/14  Yes Alba Cory, MD  metFORMIN (GLUCOPHAGE) 850 MG tablet Take 850 mg by mouth 2 (two) times daily with a meal.   Yes Historical Provider, MD  midodrine (PROAMATINE) 10 MG tablet Take 0.5 tablets (5 mg total) by mouth 3 (three) times daily as needed. Patient taking differently: Take 5 mg by mouth 3 (three) times daily as needed (for low BP).  09/14/14  Yes Alba Cory, MD  nitroGLYCERIN (NITROSTAT) 0.4 MG SL tablet Place 0.4 mg under the tongue every 5 (five) minutes as needed for chest pain.   Yes Historical Provider, MD  QUEtiapine (SEROQUEL) 100 MG tablet Take 1 tablet (100 mg total) by mouth at bedtime. 09/14/14  Yes Alba Cory, MD  rOPINIRole (REQUIP) 0.5 MG tablet Take 1 tablet (0.5 mg total) by  mouth at bedtime. 07/30/14  Yes Alba Cory, MD  rosuvastatin (CRESTOR) 20 MG tablet Take 1 tablet (20 mg total) by mouth at bedtime. 09/14/14  Yes Alba Cory, MD    Inpatient Medications:  . aspirin EC  81 mg Oral Daily  . escitalopram  10 mg Oral Daily  . heparin  5,000 Units Subcutaneous 3 times per day  . insulin aspart  0-5 Units Subcutaneous QHS  . insulin aspart  0-9 Units Subcutaneous TID WC  . Insulin Degludec  10 Units Subcutaneous Q2200  . QUEtiapine  100 mg Oral QHS  . rOPINIRole  0.5 mg Oral QHS  . rosuvastatin  20 mg Oral QHS  . vitamin B-12  100 mcg Oral Daily   . sodium chloride 75 mL/hr at 09/24/14 2254    Allergies: No Known Allergies  Social History   Social History  . Marital Status: Married    Spouse Name: Angelique Blonder  .  Number of Children: 2  . Years of Education: N/A   Occupational History  . Not on file.   Social History Main Topics  . Smoking status: Never Smoker   . Smokeless tobacco: Never Used  . Alcohol Use: No  . Drug Use: No  . Sexual Activity: No   Other Topics Concern  . Not on file   Social History Narrative     Family History  Problem Relation Age of Onset  . Stroke Mother   . COPD Mother   . Heart failure Mother   . Heart attack Mother   . Colon cancer Father   . Diabetes Father   . Diabetes Brother   . Cancer Brother      Review of Systems: Review of Systems  Constitutional: Positive for weight loss and malaise/fatigue. Negative for fever, chills and diaphoresis.  HENT: Negative for congestion.   Eyes: Negative for discharge and redness.  Respiratory: Negative for cough, hemoptysis, sputum production, shortness of breath and wheezing.   Cardiovascular: Negative for chest pain, palpitations, orthopnea, claudication, leg swelling and PND.  Gastrointestinal: Positive for nausea and vomiting. Negative for heartburn, abdominal pain, diarrhea and constipation.  Musculoskeletal: Negative for myalgias and falls.  Skin:  Negative for rash.  Neurological: Positive for dizziness and weakness. Negative for tingling, tremors, sensory change, speech change, focal weakness, loss of consciousness and headaches.  Endo/Heme/Allergies: Does not bruise/bleed easily.  Psychiatric/Behavioral: The patient is not nervous/anxious.   All other systems reviewed and are negative.    Labs: No results for input(s): CKTOTAL, CKMB, TROPONINI in the last 72 hours. Lab Results  Component Value Date   WBC 6.2 09/25/2014   HGB 11.3* 09/25/2014   HCT 33.1* 09/25/2014   MCV 86.5 09/25/2014   PLT 159 09/25/2014    Recent Labs Lab 09/25/14 0448  NA 138  K 4.1  CL 106  CO2 24  BUN 64*  CREATININE 3.36*  CALCIUM 9.0  GLUCOSE 183*   Lab Results  Component Value Date   CHOL 93 09/09/2014   HDL 23* 09/09/2014   LDLCALC 26 09/09/2014   TRIG 220* 09/09/2014   No results found for: DDIMER  Radiology/Studies:  No results found.  EKG: NSR, 80 bpm, short PR, prolonged QT, inferolateral st/t changes   Weights: Filed Weights   09/24/14 1739 09/24/14 2137  Weight: 207 lb (93.895 kg) 210 lb (95.255 kg)     Physical Exam: Blood pressure 121/73, pulse 70, temperature 97.9 F (36.6 C), temperature source Oral, resp. rate 18, height 5\' 7"  (1.702 m), weight 210 lb (95.255 kg), SpO2 97 %. Body mass index is 32.88 kg/(m^2). General: Well developed, well nourished, in no acute distress. Head: Normocephalic, atraumatic, sclera non-icteric, no xanthomas, nares are without discharge.  Neck: Negative for carotid bruits. JVD not elevated. Lungs: Clear bilaterally to auscultation without wheezes, rales, or rhonchi. Breathing is unlabored. Heart: RRR with S1 S2. No murmurs, rubs, or gallops appreciated. Abdomen: Soft, non-tender, non-distended with normoactive bowel sounds. No hepatomegaly. No rebound/guarding. No obvious abdominal masses. Msk:  Strength and tone appear normal for age. Extremities: No clubbing or cyanosis. No edema.   Distal pedal pulses are 2+ and equal bilaterally. Neuro: Alert and oriented X 3. No facial asymmetry. No focal deficit. Moves all extremities spontaneously. Psych:  Responds to questions appropriately with a normal affect.    Assessment and Plan:   1. Orthostatic hypotension: -Long standing issue for him -Worsened with the restarting of low-dose Coreg 3.125 mg bid,  and exacerbated by nausea and vomiting lasting all night on the night of 8/14 leading to volume depletion  -Agree with gentle IV hydration given his systolic CHF in the setting of his acute on chronic renal failure and symptoms -Coreg has been held, symptoms re improved -Has been taking midodrine 5 mg once to twice daily at home prior to his admission -Would continue hydration as above as well as midodrine 5 mg bid  -Check orthostatics later today/tomorrow for possible improvement  -Continue to hold HF medications given his labile BPs -Will discuss possible evaluation with the advanced heart failure clinic in [place] with MD given this difficult case  2. Acute on CKD stage II-III: -Improving with hydration and improved blood pressures  -Continue to monitor  3. Chronic systolic CHF/ICM: -#1 precludes starting of HF medications  -Euvolemic  -Possible advanced heart failure eval in [place] as outpatient given this difficult case  -Unable to start Dennard Schaumann, PA-C Pager: [phone] 09/25/2014, 8:30 AM  Attending Note:   The patient was seen and examined.  Agree with assessment and plan as noted above.  Changes made to the above note as needed.  1. Chronic systolic congestive heart failure:  Pt has a long hx of Chronic systolic CHF due to a remote mid LAD occlusion. Had tolerated CHF meds including Entresto until recently when the [facility] was stopped due to hyperkalemia.   2. Orthostatic hypertension: He has had significant orthostatic hypotension over the past several weeks.  For the past 2 days he  has had profound nausea with vomiting with little PO intake. He was found to have acute renal insufficiency .   He is feeling better after getting gentle hydration . Renal function has improved slightly    At this point I would continue gentle hydration. I would like to repeat his echocardiogram for further evaluation of his left ventricular function. His last echocardiogram was in September, 2018.  He's been having problems with orthostatic hypotension.   Seroquel is known to cause orthostatic hypotension and it might be that he needs a different medication.   3.  Coronary artery disease: The patient has known coronary artery disease but has not been having any episodes of angina. He has a totally occluded mid LAD. He has minor luminal regular days elsewhere.  4. Acute renal failure: This is most likely due to volume depletion. His renal function is already improving with gentle hydration. Agree with renal consultation.  Vesta Mixer, Montez Hageman., MD, [facility] 09/25/2014, 9:34 AM [address],  [address] Office [phone] Pager [phone]

## 2014-09-25 NOTE — Care Management Note (Signed)
Case Management Note  Patient Details  Name: Anjel Graul MRN: [id number] Date of Birth: [birth year]  Subjective/Objective:         E-mail sent notifying Dr Judithann Sheen per 30 Day readmission for Mr Jacquet.           Action/Plan:   Expected Discharge Date:                  Expected Discharge Plan:     In-House Referral:     Discharge planning Services     Post Acute Care Choice:    Choice offered to:     DME Arranged:    DME Agency:     HH Arranged:    HH Agency:     Status of Service:     Medicare Important Message Given:    Date Medicare IM Given:    Medicare IM give by:    Date Additional Medicare IM Given:    Additional Medicare Important Message give by:     If discussed at Long Length of Stay Meetings, dates discussed:    Additional Comments:  Eulalah Rupert A, RN 09/25/2014, 2:35 PM

## 2014-09-25 NOTE — Progress Notes (Signed)
*  PRELIMINARY RESULTS* Echocardiogram 2D Echocardiogram has been performed.  Garrel Ridgel Stills 09/25/2014, 11:38 AM

## 2014-09-25 NOTE — Consult Note (Signed)
[facility] CONSULT NOTE    Date: 09/25/2014                  [name]  MRN: [id number]  DOB: 11-23-1955  Age / Sex: 59 y.o., male         PCP: Loistine Chance, MD                 Service Requesting Consult: Dr. Dustin Flock                 Reason for Consult: Acute renal failure, CKD stage III            History of Present Illness: Patient is a 58 y.o. male with a PMHx of diabetes mellitus type 2, hypertension, hyperlipidemia, depression, CKD stage III, history of orthostatic hypotension, obstructive sleep apnea, ischemic cardiomypoathy, chronic systolic heart failure EF 35%, who was admitted to [facility] on 09/24/2014 for evaluation of lightheadedeness, dizziness, hypotension, and nausea/vomiting.  Patient was recently diagnosed with orthostatic hypotension.  He was recently started on midodrine for this.  He recently developed nausea, vomiting at home.  He became dehdyrated and became dizzy and lightheaded.  He was to see [place] in the office for CKD stage III.  He has known diabetic retinopathy.   The patients baseline Cr is 1.39 on 08/15/14.  He's also had periodic hyperkalemia.  This admission Cr was 4.03 upon admission, and is currently down to 3.36 with IVF hydration. Renal US was performed and is negative for hydronephrosis.     Medications: Outpatient medications: Prescriptions prior to admission  Medication Sig Dispense Refill Last Dose  . aspirin EC 81 MG tablet Take 81 mg by mouth daily.   09/24/2014 at Unknown time  . carvedilol (COREG) 3.125 MG tablet Take 1 tablet (3.125 mg total) by mouth 2 (two) times daily with a meal. 60 tablet 3 09/24/2014 at 0800  . Cyanocobalamin (VITAMIN B-12 PO) Take 1 tablet by mouth daily.   09/24/2014 at Unknown time  . escitalopram (LEXAPRO) 10 MG tablet Take 1 tablet (10 mg total) by mouth daily. 30 tablet 1 09/23/2014 at Unknown time  . Insulin Degludec (TRESIBA FLEXTOUCH) 200 UNIT/ML SOPN Inject 10 Units into  the skin daily. 5 pen 2 09/23/2014 at Unknown time  . metFORMIN (GLUCOPHAGE) 850 MG tablet Take 850 mg by mouth 2 (two) times daily with a meal.   09/24/2014 at Unknown time  . midodrine (PROAMATINE) 10 MG tablet Take 0.5 tablets (5 mg total) by mouth 3 (three) times daily as needed. (Patient taking differently: Take 5 mg by mouth 3 (three) times daily as needed (for low BP). ) 120 tablet 1 09/24/2014 at Unknown time  . nitroGLYCERIN (NITROSTAT) 0.4 MG SL tablet Place 0.4 mg under the tongue every 5 (five) minutes as needed for chest pain.   PRN at PRN  . QUEtiapine (SEROQUEL) 100 MG tablet Take 1 tablet (100 mg total) by mouth at bedtime. 30 tablet 0 09/23/2014 at Unknown time  . rOPINIRole (REQUIP) 0.5 MG tablet Take 1 tablet (0.5 mg total) by mouth at bedtime. 30 tablet 5 09/23/2014 at Unknown time  . rosuvastatin (CRESTOR) 20 MG tablet Take 1 tablet (20 mg total) by mouth at bedtime. 30 tablet 0 09/23/2014 at Unknown time    Current medications: Current Facility-Administered Medications  Medication Dose Route Frequency Provider Last Rate Last Dose  . 0.9 %  sodium chloride infusion   Intravenous Continuous Gladstone Lighter, MD  75 mL/hr at 09/25/14 1152    . acetaminophen (TYLENOL) tablet 650 mg  650 mg Oral Q6H PRN Gladstone Lighter, MD       Or  . acetaminophen (TYLENOL) suppository 650 mg  650 mg Rectal Q6H PRN Gladstone Lighter, MD      . aspirin EC tablet 81 mg  81 mg Oral Daily Gladstone Lighter, MD   81 mg at 09/25/14 0934  . docusate sodium (COLACE) capsule 100 mg  100 mg Oral BID PRN Gladstone Lighter, MD      . escitalopram (LEXAPRO) tablet 10 mg  10 mg Oral Daily Gladstone Lighter, MD   10 mg at 09/25/14 0934  . heparin injection 5,000 Units  5,000 Units Subcutaneous 3 times per day Gladstone Lighter, MD   5,000 Units at 09/25/14 1410  . insulin aspart (novoLOG) injection 0-5 Units  0-5 Units Subcutaneous QHS Gladstone Lighter, MD   0 Units at 09/24/14 2200  . insulin aspart (novoLOG)  injection 0-9 Units  0-9 Units Subcutaneous TID WC Gladstone Lighter, MD   3 Units at 09/25/14 1237  . Insulin Degludec SOPN 10 Units  10 Units Subcutaneous Q2200 Gladstone Lighter, MD   10 Units at 09/24/14 2215  . midodrine (PROAMATINE) tablet 5 mg  5 mg Oral TID PRN Gladstone Lighter, MD      . nitroGLYCERIN (NITROSTAT) SL tablet 0.4 mg  0.4 mg Sublingual Q5 min PRN Gladstone Lighter, MD      . QUEtiapine (SEROQUEL) tablet 100 mg  100 mg Oral QHS Gladstone Lighter, MD   100 mg at 09/24/14 2255  . rOPINIRole (REQUIP) tablet 0.5 mg  0.5 mg Oral QHS Gladstone Lighter, MD   0.5 mg at 09/24/14 2255  . rosuvastatin (CRESTOR) tablet 20 mg  20 mg Oral QHS Gladstone Lighter, MD   20 mg at 09/24/14 2256  . senna (SENOKOT) tablet 8.6 mg  1 tablet Oral Daily PRN Gladstone Lighter, MD      . vitamin B-12 (CYANOCOBALAMIN) tablet 100 mcg  100 mcg Oral Daily Gladstone Lighter, MD   100 mcg at 09/25/14 2992      Allergies: No Known Allergies    Past Medical History: Past Medical History  Diagnosis Date  . Diabetes mellitus without complication   . Hypertension   . Hyperlipidemia   . Depression   . Rotator cuff tear   . CKD (chronic kidney disease), stage III   . Ischemic cardiomyopathy     a. 10/2013 Echo: EF 35-40%, severe distal anteroseptal, anterior, and apical HK. Diast dysfxn, mild conc LVH, mildly dil LA, mild Ao sclerosis w/o stenosis.  Marland Kitchen CAD (coronary artery disease)     a. 10/2013 Lexi MV: EF 31%, apical, septal, ant-apical, inf-apical, lat-apical scar, septal and apical peri-infarct ischemia;  b. 10/2013 Cath: LM nl, LAD 20ost, 169m(faint R->L collats), D1 80p, LCX min irregs, OM1 min irregs, OM2 nl, OM3 30p, RCA 20p, PDA 60, RPL min irregs-->Med Rx.  . Morbid obesity   . CHF (congestive heart failure)   . Depression   . OSA (obstructive sleep apnea)   . Sleep apnea     recent test confirmed diagnosis, was performed at the sleep center across the street  . Hypotension      Past  Surgical History: Past Surgical History  Procedure Laterality Date  . Cardiac catheterization  10/19/2013    [facility]  . Uvulectomy  1995  . Atrial fibrillation ablation  1995  . Elbow surgery Left   .  Rotator cuff repair    . Carpal tunnel release       Family History: Family History  Problem Relation Age of Onset  . Stroke Mother   . COPD Mother   . Heart failure Mother   . Heart attack Mother   . Colon cancer Father   . Diabetes Father   . Diabetes Brother   . Cancer Brother      Social History: Social History   Social History  . Marital Status: Married    Spouse Name: [name]  . Number of Children: 2  . Years of Education: N/A   Occupational History  . Not on file.   Social History Main Topics  . Smoking status: Never Smoker   . Smokeless tobacco: Never Used  . Alcohol Use: No  . Drug Use: No  . Sexual Activity: No   Other Topics Concern  . Not on file   Social History Narrative     Review of Systems: As per HPI  Vital Signs: Blood pressure 137/85, pulse 65, temperature 97.8 F (36.6 C), temperature source Oral, resp. rate 18, height _0  (1.702 m), weight 95.255 kg (210 lb), SpO2 99 %.  Weight trends: Filed Weights   09/24/14 1739 09/24/14 2137  Weight: 93.895 kg (207 lb) 95.255 kg (210 lb)    Physical Exam: General: NAD, resting comfortably  Head: Normocephalic, atraumatic.  Eyes: Anicteric, EOMI  Nose: Mucous membranes moist, not inflammed, nonerythematous.  Throat: Oropharynx nonerythematous, no exudate appreciated. Poor dentition.  Neck: supple  Lungs:  Normal respiratory effort. Clear to auscultation BL without crackles or wheezes.  Heart: RRR. S1 and S2 normal without gallop, murmur, or rubs.  Abdomen:  BS normoactive. Soft, Nondistended, non-tender.  No masses or organomegaly.  Extremities: No pretibial edema.  Neurologic: A&O X3, Motor strength is 5/5 in the all 4 extremities  Skin: No visible rashes, scars.    Lab  results: Basic Metabolic Panel:  Recent Labs Lab 09/24/14 1756 09/25/14 0448  NA 136 138  K 4.2 4.1  CL 101 106  CO2 23 24  GLUCOSE 189* 183*  BUN 69* 64*  CREATININE 4.03* 3.36*  CALCIUM 9.9 9.0    Liver Function Tests: No results for input(s): AST, ALT, ALKPHOS, BILITOT, PROT, ALBUMIN in the last 168 hours. No results for input(s): LIPASE, AMYLASE in the last 168 hours. No results for input(s): AMMONIA in the last 168 hours.  CBC:  Recent Labs Lab 09/24/14 1756 09/25/14 0448  WBC 7.6 6.2  HGB 12.3* 11.3*  HCT 37.7* 33.1*  MCV 87.4 86.5  PLT 202 159    Cardiac Enzymes: No results for input(s): CKTOTAL, CKMB, CKMBINDEX, TROPONINI in the last 168 hours.  BNP: Invalid input(s): POCBNP  CBG:  Recent Labs Lab 09/24/14 2218 09/25/14 0745 09/25/14 1158  GLUCAP 75 142* 205*    Microbiology: Results for orders placed or performed in visit on 10/18/13  Culture, blood (single)     Status: None   Collection Time: 10/17/13 12:07 PM  Result Value Ref Range Status   Micro Text Report   Final       COMMENT                   NO GROWTH AEROBICALLY/ANAEROBICALLY IN 5 DAYS   ANTIBIOTIC  Culture, blood (single)     Status: None   Collection Time: 10/17/13 12:07 PM  Result Value Ref Range Status   Micro Text Report   Final       COMMENT                   NO GROWTH AEROBICALLY/ANAEROBICALLY IN 5 DAYS   ANTIBIOTIC                                                        Coagulation Studies: No results for input(s): LABPROT, INR in the last 72 hours.  Urinalysis:  Recent Labs  09/24/14 2150  COLORURINE YELLOW*  LABSPEC 1.018  PHURINE 5.0  GLUCOSEU NEGATIVE  HGBUR 1+*  BILIRUBINUR NEGATIVE  KETONESUR NEGATIVE  PROTEINUR 100*  NITRITE NEGATIVE  LEUKOCYTESUR NEGATIVE      Imaging: US Renal  09/25/2014   CLINICAL DATA:  Acute renal failure, history of chronic renal insufficiency, CHF  EXAM: RENAL /  URINARY TRACT ULTRASOUND COMPLETE  COMPARISON:  Report of renal ultrasound dated August 01, 1998  FINDINGS: Right Kidney:  Length: 11.8 cm. Echogenicity within normal limits. No mass or hydronephrosis visualized.  Left Kidney:  Length: 12.1 cm. Echogenicity within normal limits. No mass or hydronephrosis visualized. There is an upper pole cyst measuring 1.5 cm in greatest dimension. This has been previously described.  Bladder:  The urinary bladder is adequately distended. There is echogenic debris floating in the urine pool. Bilateral ureteral jets are demonstrated.  IMPRESSION: 1. The renal cortical echotexture is normal. 2. There is no hydronephrosis. 3. There is a simple appearing cyst in the upper pole of the left kidney 4. Echogenic material is floating in the urinary bladder likely reflecting cellular debris.   Electronically Signed   By: David  Martinique M.D.   On: 09/25/2014 13:01      Assessment & Plan: Pt is a 59 y.o. yo male with a PMHx of diabetes mellitus type 2, hypertension, hyperlipidemia, depression, CKD stage III, history of orthostatic hypotension, obstructive sleep apnea, ischemic cardiomypoathy, chronic systolic heart failure EF 35%, who was admitted to [facility] on 09/24/2014 for evaluation of lightheadedeness, dizziness, hypotension, and nausea/vomiting.  1.  Acute renal failure/CKD stage III/proteinuria/diabetes mellitus type 2 with CKD:  Baseline Cr 1.39, egfr of 55.  Suspect pt now with acute renal failure related to nausea/vomiting. Renal US negative for hydronephrosis.  Continue IVF hydration.  Check spep/upep/ANA.  He has appoitnment in our clinic for follow up of his CKD.   2.  Anemia of CKD.  Hemoglobin noted to be 11.4, check iron studies, no indication for epogen at the moment.  3.  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  Continue midodrine.  Will also check cortisol level given hyperkalemia as well.   4.  Thanks for  consult.

## 2014-09-25 NOTE — Progress Notes (Signed)
Pt's skin is dry and warm. Alert and oriented x4. No skin issues present at this time. Skin verified by Misty Stanley, RN. Urine collected and sent to Lab.

## 2014-09-26 ENCOUNTER — Encounter: Payer: Self-pay | Admitting: *Deleted

## 2014-09-26 LAB — CORTISOL: CORTISOL PLASMA: 5.4 ug/dL

## 2014-09-26 LAB — GLUCOSE, CAPILLARY
GLUCOSE-CAPILLARY: 246 mg/dL — AB (ref 65–99)
Glucose-Capillary: 95 mg/dL (ref 65–99)
Glucose-Capillary: 184 mg/dL — ABNORMAL HIGH (ref 65–99)
Glucose-Capillary: 163 mg/dL — ABNORMAL HIGH (ref 65–99)

## 2014-09-26 LAB — PROTEIN ELECTROPHORESIS, SERUM
A/G Ratio: 1.2 (ref 0.7–1.7)
Albumin ELP: 3.2 g/dL (ref 2.9–4.4)
Alpha-1-Globulin: 0.2 g/dL (ref 0.0–0.4)
Alpha-2-Globulin: 0.9 g/dL (ref 0.4–1.0)
Beta Globulin: 0.8 g/dL (ref 0.7–1.3)
GAMMA GLOBULIN: 0.7 g/dL (ref 0.4–1.8)
GLOBULIN, TOTAL: 2.6 g/dL (ref 2.2–3.9)
TOTAL PROTEIN ELP: 5.8 g/dL — AB (ref 6.0–8.5)

## 2014-09-26 LAB — UIFE/LIGHT CHAINS/TP QN, 24-HR UR
% BETA, URINE: 24.9 %
ALBUMIN, U: 56.8 %
ALPHA 1 URINE: 1.4 %
ALPHA 2 UR: 8.4 %
FREE LAMBDA LT CHAINS, UR: 71.6 mg/L — AB (ref 0.24–6.66)
Free Kappa/Lambda Ratio: 7.93 (ref 2.04–10.37)
Free Lt Chn Excr Rate: 568 mg/L — ABNORMAL HIGH (ref 1.35–24.19)
GAMMA GLOBULIN URINE: 8.5 %
TIME-UPE24: 24 h
TOTAL PROTEIN, URINE-UPE24: 95.2 mg/dL

## 2014-09-26 LAB — KAPPA/LAMBDA LIGHT CHAINS
KAPPA, LAMDA LIGHT CHAIN RATIO: 1.57 (ref 0.26–1.65)
Kappa free light chain: 47.11 mg/L — ABNORMAL HIGH (ref 3.30–19.40)
LAMDA FREE LIGHT CHAINS: 30.05 mg/L — AB (ref 5.71–26.30)

## 2014-09-26 LAB — PARATHYROID HORMONE, INTACT (NO CA): PTH: 30 pg/mL (ref 15–65)

## 2014-09-26 LAB — RENAL FUNCTION PANEL
Albumin: 3.6 g/dL (ref 3.5–5.0)
Anion gap: 5 (ref 5–15)
BUN: 50 mg/dL — ABNORMAL HIGH (ref 6–20)
CHLORIDE: 112 mmol/L — AB (ref 101–111)
CO2: 25 mmol/L (ref 22–32)
CREATININE: 2.07 mg/dL — AB (ref 0.61–1.24)
Calcium: 8.9 mg/dL (ref 8.9–10.3)
GFR calc non Af Amer: 34 mL/min — ABNORMAL LOW (ref >60)
GFR, EST AFRICAN AMERICAN: 40 mL/min — AB (ref >60)
GLUCOSE: 110 mg/dL — AB (ref 65–99)
Phosphorus: 3.1 mg/dL (ref 2.5–4.6)
Potassium: 5.1 mmol/L (ref 3.5–5.1)
Sodium: 142 mmol/L (ref 135–145)

## 2014-09-26 LAB — IRON AND TIBC
IRON: 79 ug/dL (ref 45–182)
Saturation Ratios: 28 % (ref 17.9–39.5)
TIBC: 287 ug/dL (ref 250–450)
UIBC: 208 ug/dL

## 2014-09-26 LAB — VITAMIN D 25 HYDROXY (VIT D DEFICIENCY, FRACTURES): VIT D 25 HYDROXY: 15.2 ng/mL — AB (ref 30.0–100.0)

## 2014-09-26 LAB — ANA W/REFLEX: ANA: NEGATIVE

## 2014-09-26 LAB — FERRITIN: Ferritin: 75 ng/mL (ref 24–336)

## 2014-09-26 MED ORDER — HYDRALAZINE HCL 20 MG/ML IJ SOLN
INTRAMUSCULAR | Status: AC
  Administered 2014-09-26: 10 mg via INTRAVENOUS
  Filled 2014-09-26: qty 1

## 2014-09-26 MED ORDER — HYDRALAZINE HCL 20 MG/ML IJ SOLN
10.0000 mg | Freq: Four times a day (QID) | INTRAMUSCULAR | Status: DC | PRN
  Administered 2014-09-26: 10 mg via INTRAVENOUS

## 2014-09-26 NOTE — Progress Notes (Signed)
Patient's blood pressure is trending down.  Will continue to monitor. Cristela Felt, RN

## 2014-09-26 NOTE — Progress Notes (Signed)
[facility] [place] [facility]                                                                                                                                                                                            Patient Demographics   Angel Cruz, is a 59 y.o. male, DOB - 1956-01-08, EAV:[id number]  Admit date - 09/24/2014   Admitting Physician Enid Baas, MD  Outpatient Primary MD for the patient is Ruel Favors, MD   LOS - 2  Subjective:  Feels better, renal function improving blood pressure improved    Review of Systems:   CONSTITUTIONAL: No documented fever. No fatigue, weakness. No weight gain, no weight loss.  EYES: No blurry or double vision.  ENT: No tinnitus. No postnasal drip. No redness of the oropharynx.  RESPIRATORY: No cough, no wheeze, no hemoptysis. No dyspnea.  CARDIOVASCULAR: No chest pain. No orthopnea. No palpitations. Recurrent syncope.  GASTROINTESTINAL: No nausea, no vomiting or diarrhea. No abdominal pain. No melena or hematochezia.  GENITOURINARY: No dysuria or hematuria.  ENDOCRINE: No polyuria or nocturia. No heat or cold intolerance.  HEMATOLOGY: No anemia. No bruising. No bleeding.  INTEGUMENTARY: No rashes. No lesions.  MUSCULOSKELETAL: No arthritis. No swelling. No gout.  NEUROLOGIC: No numbness, tingling, or ataxia. No seizure-type activity.  PSYCHIATRIC: No anxiety. No insomnia. No ADD.    Vitals:   Filed Vitals:   09/25/14 1159 09/25/14 1935 09/26/14 0454 09/26/14 1114  BP: 137/85 162/83 124/81 140/86  Pulse: 65 61 66 66  Temp: 97.8 F (36.6 C) 97.6 F (36.4 C) 97.4 F (36.3 C) 98 F (36.7 C)  TempSrc:  Oral Oral Oral  Resp: 18 16 22 18   Height:      Weight:      SpO2: 99% 99% 96% 100%    Wt Readings from Last 3 Encounters:  09/24/14 95.255 kg (210 lb)  09/20/14 97.977 kg (216 lb)  09/14/14 99.066 kg (218 lb 6.4 oz)     Intake/Output Summary (Last 24 hours) at 09/26/14 1436 Last  data filed at 09/26/14 1305  Gross per 24 hour  Intake 1443.75 ml  Output   2250 ml  Net -806.25 ml    Physical Exam:   GENERAL: Pleasant-appearing in no apparent distress.  HEAD, EYES, EARS, NOSE AND THROAT: Atraumatic, normocephalic. Extraocular muscles are intact. Pupils equal and reactive to light. Sclerae anicteric. No conjunctival injection. No oro-pharyngeal erythema.  NECK: Supple. There is no jugular venous distention. No bruits, no lymphadenopathy, no thyromegaly.  HEART: Regular rate and rhythm,. No murmurs, no rubs, no clicks.  LUNGS: Clear to  auscultation bilaterally. No rales or rhonchi. No wheezes.  ABDOMEN: Soft, flat, nontender, nondistended. Has good bowel sounds. No hepatosplenomegaly appreciated.  EXTREMITIES: No evidence of any cyanosis, clubbing, or peripheral edema.  +2 pedal and radial pulses bilaterally.  NEUROLOGIC: The patient is alert, awake, and oriented x3 with no focal motor or sensory deficits appreciated bilaterally.  SKIN: Moist and warm with no rashes appreciated.  Psych: Not anxious, depressed LN: No inguinal LN enlargement    Antibiotics   Anti-infectives    None      Medications   Scheduled Meds: . aspirin EC  81 mg Oral Daily  . escitalopram  10 mg Oral Daily  . heparin  5,000 Units Subcutaneous 3 times per day  . insulin aspart  0-5 Units Subcutaneous QHS  . insulin aspart  0-9 Units Subcutaneous TID WC  . Insulin Degludec  10 Units Subcutaneous Q2200  . QUEtiapine  100 mg Oral QHS  . rOPINIRole  0.5 mg Oral QHS  . rosuvastatin  20 mg Oral QHS  . vitamin B-12  100 mcg Oral Daily   Continuous Infusions: . sodium chloride 75 mL/hr at 09/26/14 1339   PRN Meds:.acetaminophen **OR** acetaminophen, docusate sodium, midodrine, nitroGLYCERIN, senna   Data Review:   Micro Results No results found for this or any previous visit (from the past 240 hour(s)).  Radiology Reports US Renal  09/25/2014   CLINICAL DATA:  Acute renal  failure, history of chronic renal insufficiency, CHF  EXAM: RENAL / URINARY TRACT ULTRASOUND COMPLETE  COMPARISON:  Report of renal ultrasound dated August 01, 1998  FINDINGS: Right Kidney:  Length: 11.8 cm. Echogenicity within normal limits. No mass or hydronephrosis visualized.  Left Kidney:  Length: 12.1 cm. Echogenicity within normal limits. No mass or hydronephrosis visualized. There is an upper pole cyst measuring 1.5 cm in greatest dimension. This has been previously described.  Bladder:  The urinary bladder is adequately distended. There is echogenic debris floating in the urine pool. Bilateral ureteral jets are demonstrated.  IMPRESSION: 1. The renal cortical echotexture is normal. 2. There is no hydronephrosis. 3. There is a simple appearing cyst in the upper pole of the left kidney 4. Echogenic material is floating in the urinary bladder likely reflecting cellular debris.   Electronically Signed   By: David  [place] M.D.   On: 09/25/2014 13:01     CBC  Recent Labs Lab 09/24/14 1756 09/25/14 0448  WBC 7.6 6.2  HGB 12.3* 11.3*  HCT 37.7* 33.1*  PLT 202 159  MCV 87.4 86.5  MCH 28.6 29.6  MCHC 32.7 34.2  RDW 13.3 13.4    Chemistries   Recent Labs Lab 09/24/14 1756 09/25/14 0448 09/26/14 0436  NA 136 138 142  K 4.2 4.1 5.1  CL 101 106 112*  CO2 GLUCOSE 189* 183* 110*  BUN 69* 64* 50*  CREATININE 4.03* 3.36* 2.07*  CALCIUM 9.9 9.0 8.9   ------------------------------------------------------------------------------------------------------------------ estimated creatinine clearance is 43.8 mL/min (by C-G formula based on Cr of 2.07). ------------------------------------------------------------------------------------------------------------------ No results for input(s): HGBA1C in the last 72 hours. ------------------------------------------------------------------------------------------------------------------ No results for input(s): CHOL, HDL, LDLCALC, TRIG,  CHOLHDL, LDLDIRECT in the last 72 hours. ------------------------------------------------------------------------------------------------------------------ No results for input(s): TSH, T4TOTAL, T3FREE, THYROIDAB in the last 72 hours.  Invalid input(s): FREET3 ------------------------------------------------------------------------------------------------------------------  Recent Labs  09/26/14 0436  FERRITIN 75  TIBC 287  IRON 79    Coagulation profile No results for input(s): INR, PROTIME in the last 168 hours.  No  results for input(s): DDIMER in the last 72 hours.  Cardiac Enzymes No results for input(s): CKMB, TROPONINI, MYOGLOBIN in the last 168 hours.  Invalid input(s): CK ------------------------------------------------------------------------------------------------------------------ Invalid input(s): POCBNP    Assessment & Plan   Dayshaun Basaldua is a 59 y.o. male with a known history of insulin-dependent diabetes mellitus, coronary artery disease, ischemic cardiomyopathy with last known ejection fraction of 35-40%, CK D stage III who was just in the hospital 2 weeks ago for hypotension and was discharged on Methergine comes back again for hypotension. Also noted to have acute renal failure today.  #1 acute renal failure-known history of stage III CK D with baseline creatinine around 1.6, Due to hypotension and suspected ATN appreciated nephrology input Renal function improved  #2 hypotension-orthostatic hypotension identified during last hospital admission. Continue midodrine at this time. IV fluids Patient should have compression stockings on all the time Random cortisol level pending Wife states that this started after starting Coreg DC this on discharge  #3 congestive heart failure-ischemic cardiomyopathy,  echocardiogram done shows normal EF  #4 insulin-dependent diabetes mellitus-continue home insulin. Sliding scale insulin   #5 DVT prophylaxis-subcutaneous  heparin     Code Status Orders        Start     Ordered   09/24/14 2159  Full code   Continuous     09/24/14 2158           Consults cardiology  DVT Prophylaxis  heparin  Lab Results  Component Value Date   PLT 159 09/25/2014     Time Spent in minutes  25    Auburn Bilberry M.D on 09/26/2014 at 2:36 PM  Between 7am to 6pm - Pager - [phone]  After 6pm go to [email or web address] - password [email or web address] [facility]  [facility]   Office  [phone]

## 2014-09-26 NOTE — Care Management (Signed)
Patient had previous observation admission and discharged on 8/2.   Patient was discharged home on Methergine.  Presents back with similar symptoms.  Patient and his wife are in agreement with home health nurse to monitor blood pressure/ orthostasis / response to medications.  Confirmed demographics and that patient is established with Dr Carlynn Purl at [place].

## 2014-09-26 NOTE — Progress Notes (Signed)
PROGRESS NOTE  Subjective:   59 year old male with history of CAD managed medically, ischemic cardiomyopathy, chronic systolic CHF, labile HTN, CKD stage III, HLD, DM2, morbid obesity, OSA, and depression who was recently seen in the [facility] ED on 7/28 for orthostasis and hyperkalemia, in clinic on 7/29 for the same, and admitted to [facility] the following day for the same presented to [facility] again on 8/15with dizziness and orthostasis in the setting of nausea and vomiting. Cardiology is consulted for further evaluation.  His BP meds have been held. He has been getting gentle hydration and is feeling much better.  Renal function has improved.   Echo from yesterday show NORMAL LV systolic function    Objective:    Vital Signs:   Temp:  [97.4 F (36.3 C)-97.8 F (36.6 C)] 97.4 F (36.3 C) (08/17 0454) Pulse Rate:  [61-66] 66 (08/17 0454) Resp:  [16-22] 22 (08/17 0454) BP: (124-162)/(81-85) 124/81 mmHg (08/17 0454) SpO2:  [96 %-99 %] 96 % (08/17 0454)  Last BM Date: 09/25/14   24-hour weight change: Weight change:   Weight trends: Filed Weights   09/24/14 1739 09/24/14 2137  Weight: 93.895 kg (207 lb) 95.255 kg (210 lb)    Intake/Output:  08/16 0701 - 08/17 0700 In: 1683.8 [P.O.:720; I.V.:963.8] Out: 1650 [Urine:1650]     Physical Exam: BP 124/81 mmHg  Pulse 66  Temp(Src) 97.4 F (36.3 C) (Oral)  Resp 22  Ht  (1.702 m)  Wt 95.255 kg (210 lb)  BMI 32.88 kg/m2  SpO2 96%  Wt Readings from Last 3 Encounters:  09/24/14 95.255 kg (210 lb)  09/20/14 97.977 kg (216 lb)  09/14/14 99.066 kg (218 lb 6.4 oz)    General: Vital signs reviewed and noted.   Head: Normocephalic, atraumatic.  Eyes: conjunctivae/corneas clear.  EOM's intact.   Throat: normal  Neck:  normal   Lungs:    clear   Heart:  RR   Abdomen:  Soft, non-tender, non-distended    Extremities: No edema    Neurologic: A&O X3, CN II - XII are grossly intact.   Psych: Normal       Labs: BMET:  Recent Labs  09/25/14 0448 09/26/14 0436  NA 138 142  K 4.1 5.1  CL 106 112*  CO2 24 25  GLUCOSE 183* 110*  BUN 64* 50*  CREATININE 3.36* 2.07*  CALCIUM 9.0 8.9  PHOS  --  3.1    Liver function tests:  Recent Labs  09/26/14 0436  ALBUMIN 3.6   No results for input(s): LIPASE, AMYLASE in the last 72 hours.  CBC:  Recent Labs  09/24/14 1756 09/25/14 0448  WBC 7.6 6.2  HGB 12.3* 11.3*  HCT 37.7* 33.1*  MCV 87.4 86.5  PLT 202 159    Cardiac Enzymes: No results for input(s): CKTOTAL, CKMB, TROPONINI in the last 72 hours.  Coagulation Studies: No results for input(s): LABPROT, INR in the last 72 hours.  Other: Invalid input(s): POCBNP No results for input(s): DDIMER in the last 72 hours. No results for input(s): HGBA1C in the last 72 hours. No results for input(s): CHOL, HDL, LDLCALC, TRIG, CHOLHDL in the last 72 hours. No results for input(s): TSH, T4TOTAL, T3FREE, THYROIDAB in the last 72 hours.  Invalid input(s): FREET3 No results for input(s): VITAMINB12, FOLATE, FERRITIN, TIBC, IRON, RETICCTPCT in the last 72 hours.   Other results:  Tele ( personally reviewed ), NSR    Medications:    Infusions: .  sodium chloride 75 mL/hr at 09/26/14 0142    Scheduled Medications: . aspirin EC  81 mg Oral Daily  . escitalopram  10 mg Oral Daily  . heparin  5,000 Units Subcutaneous 3 times per day  . insulin aspart  0-5 Units Subcutaneous QHS  . insulin aspart  0-9 Units Subcutaneous TID WC  . Insulin Degludec  10 Units Subcutaneous Q2200  . QUEtiapine  100 mg Oral QHS  . rOPINIRole  0.5 mg Oral QHS  . rosuvastatin  20 mg Oral QHS  . vitamin B-12  100 mcg Oral Daily    Assessment/ Plan:   Active Problems:   ARF (acute renal failure)  1. Hx of systolic CHf His EF has not normalized.   At this point, I would not re-try him on meds.  He was getting very hypotensive on coreg.   At this point I would not try to restart his CHF meds  since he developed severe hypotension and renal failure  On low doses of meds.  His hypotension and acute renal insufficiency are c/w severe volume depletion but he does not report any significant N/V diarrhea and has been drinking normally. Its possible that there is another etiology that is not related to the heart.   Would hold midodrine for now   He can be discharged at any time from my standpoint. Follow up with Dr. Kirke Corin .  We can consider starting low dose Coreg if his BP increases.    Disposition:  Length of Stay: 2  Alvia Grove., MD, [facility] 09/26/2014, 9:54 AM Office [phone] Pager [phone]

## 2014-09-26 NOTE — Progress Notes (Signed)
Patient's BP reported high by Shanda Bumps, CNA.  Manual BP done and patient's BP verified to be elevated with no PRN medication to give.  Spoke with Dr Enedina Finner, telephone order given.    Cristela Felt, RN

## 2014-09-26 NOTE — Progress Notes (Signed)
[facility]  ROUNDING NOTE   Subjective:  Pt feeling better today. Renal function improved. Still on IVFs.    Objective:  Vital signs in last 24 hours:  Temp:  [97.4 F (36.3 C)-98 F (36.7 C)] 98 F (36.7 C) (08/17 1114) Pulse Rate:  [61-66] 66 (08/17 1114) Resp:  [16-22] 18 (08/17 1114) BP: (124-162)/(81-86) 140/86 mmHg (08/17 1114) SpO2:  [96 %-100 %] 100 % (08/17 1114)  Weight change:  Filed Weights   09/24/14 1739 09/24/14 2137  Weight: 93.895 kg (207 lb) 95.255 kg (210 lb)    Intake/Output: I/O last 3 completed shifts: In: 2202.5 [P.O.:720; I.V.:1482.5] Out: 1950 [Urine:1950]   Intake/Output this shift:  Total I/O In: 0  Out: 850 [Urine:850]  Physical Exam: General: NAD, resting in bed  Head: Normocephalic, atraumatic. Moist oral mucosal membranes  Eyes: Anicteric  Neck: Supple, trachea midline  Lungs:  Clear to auscultation normal effort  Heart: Regular rate and rhythm  Abdomen:  Soft, nontender, BS present  Extremities:  1+ peripheral edema.  Neurologic: Nonfocal, moving all four extremities  Skin: No rashes       Basic Metabolic Panel:  Recent Labs Lab 09/24/14 1756 09/25/14 0448 09/26/14 0436  NA 136 138 142  K 4.2 4.1 5.1  CL 101 106 112*  CO2 $Re'23 24 25  'JaS$ GLUCOSE 189* 183* 110*  BUN 69* 64* 50*  CREATININE 4.03* 3.36* 2.07*  CALCIUM 9.9 9.0 8.9  PHOS  --   --  3.1    Liver Function Tests:  Recent Labs Lab 09/26/14 0436  ALBUMIN 3.6   No results for input(s): LIPASE, AMYLASE in the last 168 hours. No results for input(s): AMMONIA in the last 168 hours.  CBC:  Recent Labs Lab 09/24/14 1756 09/25/14 0448  WBC 7.6 6.2  HGB 12.3* 11.3*  HCT 37.7* 33.1*  MCV 87.4 86.5  PLT 202 159    Cardiac Enzymes: No results for input(s): CKTOTAL, CKMB, CKMBINDEX, TROPONINI in the last 168 hours.  BNP: Invalid input(s): POCBNP  CBG:  Recent Labs Lab 09/25/14 1158 09/25/14 1623 09/25/14 1936 09/26/14 0719  09/26/14 1113  GLUCAP 205* 156* 205* 95 246*    Microbiology: Results for orders placed or performed in visit on 10/18/13  Culture, blood (single)     Status: None   Collection Time: 10/17/13 12:07 PM  Result Value Ref Range Status   Micro Text Report   Final       COMMENT                   NO GROWTH AEROBICALLY/ANAEROBICALLY IN 5 DAYS   ANTIBIOTIC                                                      Culture, blood (single)     Status: None   Collection Time: 10/17/13 12:07 PM  Result Value Ref Range Status   Micro Text Report   Final       COMMENT                   NO GROWTH AEROBICALLY/ANAEROBICALLY IN 5 DAYS   ANTIBIOTIC  Coagulation Studies: No results for input(s): LABPROT, INR in the last 72 hours.  Urinalysis:  Recent Labs  09/24/14 2150  COLORURINE YELLOW*  LABSPEC 1.018  PHURINE 5.0  GLUCOSEU NEGATIVE  HGBUR 1+*  BILIRUBINUR NEGATIVE  KETONESUR NEGATIVE  PROTEINUR 100*  NITRITE NEGATIVE  LEUKOCYTESUR NEGATIVE      Imaging: US Renal  09/25/2014   CLINICAL DATA:  Acute renal failure, history of chronic renal insufficiency, CHF  EXAM: RENAL / URINARY TRACT ULTRASOUND COMPLETE  COMPARISON:  Report of renal ultrasound dated August 01, 1998  FINDINGS: Right Kidney:  Length: 11.8 cm. Echogenicity within normal limits. No mass or hydronephrosis visualized.  Left Kidney:  Length: 12.1 cm. Echogenicity within normal limits. No mass or hydronephrosis visualized. There is an upper pole cyst measuring 1.5 cm in greatest dimension. This has been previously described.  Bladder:  The urinary bladder is adequately distended. There is echogenic debris floating in the urine pool. Bilateral ureteral jets are demonstrated.  IMPRESSION: 1. The renal cortical echotexture is normal. 2. There is no hydronephrosis. 3. There is a simple appearing cyst in the upper pole of the left kidney 4. Echogenic material is floating in the  urinary bladder likely reflecting cellular debris.   Electronically Signed   By: David  Martinique M.D.   On: 09/25/2014 13:01     Medications:   . sodium chloride 75 mL/hr at 09/26/14 0142   . aspirin EC  81 mg Oral Daily  . escitalopram  10 mg Oral Daily  . heparin  5,000 Units Subcutaneous 3 times per day  . insulin aspart  0-5 Units Subcutaneous QHS  . insulin aspart  0-9 Units Subcutaneous TID WC  . Insulin Degludec  10 Units Subcutaneous Q2200  . QUEtiapine  100 mg Oral QHS  . rOPINIRole  0.5 mg Oral QHS  . rosuvastatin  20 mg Oral QHS  . vitamin B-12  100 mcg Oral Daily   acetaminophen **OR** acetaminophen, docusate sodium, midodrine, nitroGLYCERIN, senna  Assessment/ Plan:  59 y.o. male  with a PMHx of diabetes mellitus type 2, hypertension, hyperlipidemia, depression, CKD stage III, history of orthostatic hypotension, obstructive sleep apnea, ischemic cardiomypoathy, chronic systolic heart failure EF 35%, who was admitted to [facility] on 09/24/2014 for evaluation of lightheadedeness, dizziness, hypotension, and nausea/vomiting.  1. Acute renal failure/CKD stage III/proteinuria/diabetes mellitus type 2 with CKD: Baseline Cr 1.39, egfr of 55. Suspect pt now with acute renal failure related to nausea/vomiting. Continue IV hydration, follow Cr trend, will need outpt follow up for his CKD.  2. Anemia of CKD. Hemoglobin noted to be 11.4, awaiting spep/upep/iron studies.  3. Orthostatic hypotension: awaiting cortisol, was on midodrine, cardiology following.    LOS: 2 Nery Kalisz 8/17/201611:52 AM

## 2014-09-27 ENCOUNTER — Telehealth: Payer: Self-pay | Admitting: *Deleted

## 2014-09-27 LAB — BASIC METABOLIC PANEL
ANION GAP: 5 (ref 5–15)
BUN: 37 mg/dL — ABNORMAL HIGH (ref 6–20)
CHLORIDE: 112 mmol/L — AB (ref 101–111)
CO2: 24 mmol/L (ref 22–32)
Calcium: 9 mg/dL (ref 8.9–10.3)
Creatinine, Ser: 1.68 mg/dL — ABNORMAL HIGH (ref 0.61–1.24)
GFR calc non Af Amer: 44 mL/min — ABNORMAL LOW (ref >60)
GFR, EST AFRICAN AMERICAN: 51 mL/min — AB (ref >60)
Glucose, Bld: 109 mg/dL — ABNORMAL HIGH (ref 65–99)
POTASSIUM: 5 mmol/L (ref 3.5–5.1)
SODIUM: 141 mmol/L (ref 135–145)

## 2014-09-27 NOTE — Progress Notes (Signed)
   09/27/14 1245  Clinical Encounter Type  Visited With Patient and family together  Visit Type Initial  Referral From Nurse  Consult/Referral To Chaplain  Spiritual Encounters  Spiritual Needs Emotional;Prayer  Stress Factors  Patient Stress Factors Health changes;Family relationships  Family Stress Factors Health changes;Family relationships  Met w/ patient & spouse. Provided spiritual care & prayer. Chap. Shealynn Saulnier G. Alton

## 2014-09-27 NOTE — Telephone Encounter (Signed)
-----   Message from Joline Maxcy sent at 09/27/2014 11:57 AM EDT ----- Regarding: TCM/PH D/C [facility] 09/27/14 for CHF   Already scheduled appt 09/02 at 11:30 am

## 2014-09-27 NOTE — Discharge Summary (Signed)
[facility] [place] at [facility]   PATIENT NAME: Angel Cruz    MR#:  [id number]  DATE OF BIRTH:  1955-06-28  DATE OF ADMISSION:  09/24/2014 ADMITTING PHYSICIAN: Enid Baas, MD  DATE OF DISCHARGE: 09/27/2014  1:59 PM  PRIMARY CARE PHYSICIAN: Ruel Favors, MD    ADMISSION DIAGNOSIS:  AKI (acute kidney injury) [N17.9] Hypotension, unspecified hypotension type [I95.9]   DISCHARGE DIAGNOSIS:  Daily renal failure on chronic kidney disease stage III  SECONDARY DIAGNOSIS:   Past Medical History  Diagnosis Date  . Diabetes mellitus without complication   . Hypertension   . Hyperlipidemia   . Depression   . Rotator cuff tear   . CKD (chronic kidney disease), stage III   . Ischemic cardiomyopathy     a. 10/2013 Echo: EF 35-40%, severe distal anteroseptal, anterior, and apical HK. Diast dysfxn, mild conc LVH, mildly dil LA, mild Ao sclerosis w/o stenosis.  Marland Kitchen CAD (coronary artery disease)     a. 10/2013 Lexi MV: EF 31%, apical, septal, ant-apical, inf-apical, lat-apical scar, septal and apical peri-infarct ischemia;  b. 10/2013 Cath: LM nl, LAD 20ost, 159m (faint R->L collats), D1 80p, LCX min irregs, OM1 min irregs, OM2 nl, OM3 30p, RCA 20p, PDA 60, RPL min irregs-->Med Rx.  . Morbid obesity   . CHF (congestive heart failure)   . OSA (obstructive sleep apnea)   . Sleep apnea     recent test confirmed diagnosis, was performed at the sleep center across the street  . Hypotension     HOSPITAL COURSE:   #1 acute renal failure-known history of stage III CKD with baseline creatinine around 1.6, Due to hypotension and suspected ATN appreciated nephrology input Renal function improved near his baseline after IV fluid support.  #2 hypotension-orthostatic hypotension identified during last hospital admission. Continue midodrine  Patient should have compression stockings on all the time Discontinued Coreg.  #3 congestive heart failure-ischemic  cardiomyopathy, echocardiogram done shows normal EF  #4 insulin-dependent diabetes mellitus-continue home insulin. Sliding scale insulin   DISCHARGE CONDITIONS:   The patient is clinically stable and was discharged to home with home health and PT today.  CONSULTS OBTAINED:  Treatment Team:  Vesta Mixer, MD Mady Haagensen, MD Shaune Pollack, MD  DRUG ALLERGIES:  No Known Allergies  DISCHARGE MEDICATIONS:   Discharge Medication List as of 09/27/2014 12:03 PM    CONTINUE these medications which have NOT CHANGED   Details  aspirin EC 81 MG tablet Take 81 mg by mouth daily., Until Discontinued, Historical Med    Cyanocobalamin (VITAMIN B-12 PO) Take 1 tablet by mouth daily., Until Discontinued, Historical Med    escitalopram (LEXAPRO) 10 MG tablet Take 1 tablet (10 mg total) by mouth daily., Starting 09/14/2014, Until Discontinued, Normal    Insulin Degludec (TRESIBA FLEXTOUCH) 200 UNIT/ML SOPN Inject 10 Units into the skin daily., Starting 08/06/2014, Until Discontinued, Normal    metFORMIN (GLUCOPHAGE) 850 MG tablet Take 850 mg by mouth 2 (two) times daily with a meal., Until Discontinued, Historical Med    midodrine (PROAMATINE) 10 MG tablet Take 0.5 tablets (5 mg total) by mouth 3 (three) times daily as needed., Starting 09/14/2014, Until Discontinued, Print    nitroGLYCERIN (NITROSTAT) 0.4 MG SL tablet Place 0.4 mg under the tongue every 5 (five) minutes as needed for chest pain., Until Discontinued, Historical Med    QUEtiapine (SEROQUEL) 100 MG tablet Take 1 tablet (100 mg total) by mouth at bedtime., Starting 09/14/2014, Until Discontinued,  Normal    rOPINIRole (REQUIP) 0.5 MG tablet Take 1 tablet (0.5 mg total) by mouth at bedtime., Starting 07/30/2014, Until Discontinued, Normal    rosuvastatin (CRESTOR) 20 MG tablet Take 1 tablet (20 mg total) by mouth at bedtime., Starting 09/14/2014, Until Discontinued, Normal      STOP taking these medications     carvedilol (COREG) 3.125  MG tablet          DISCHARGE INSTRUCTIONS:    If you experience worsening of your admission symptoms, develop shortness of breath, life threatening emergency, suicidal or homicidal thoughts you must seek medical attention immediately by calling 911 or calling your MD immediately  if symptoms less severe.  You Must read complete instructions/literature along with all the possible adverse reactions/side effects for all the Medicines you take and that have been prescribed to you. Take any new Medicines after you have completely understood and accept all the possible adverse reactions/side effects.   Please note  You were cared for by a hospitalist during your hospital stay. If you have any questions about your discharge medications or the care you received while you were in the hospital after you are discharged, you can call the unit and asked to speak with the hospitalist on call if the hospitalist that took care of you is not available. Once you are discharged, your primary care physician will handle any further medical issues. Please note that NO REFILLS for any discharge medications will be authorized once you are discharged, as it is imperative that you return to your primary care physician (or establish a relationship with a primary care physician if you do not have one) for your aftercare needs so that they can reassess your need for medications and monitor your lab values.    Today   SUBJECTIVE   No complaint.   VITAL SIGNS:  Blood pressure 157/90, pulse 72, temperature 97.8 F (36.6 C), temperature source Oral, resp. rate 16, height 5\' 7"  (1.702 m), weight 95.255 kg (210 lb), SpO2 100 %.  I/O:   Intake/Output Summary (Last 24 hours) at 09/27/14 1744 Last data filed at 09/27/14 1246  Gross per 24 hour  Intake   2040 ml  Output   2050 ml  Net    -10 ml    PHYSICAL EXAMINATION:  GENERAL:  59 y.o.-year-old patient lying in the bed with no acute distress. Obese. EYES:  Pupils equal, round, reactive to light and accommodation. No scleral icterus. Extraocular muscles intact.  HEENT: Head atraumatic, normocephalic. Oropharynx and nasopharynx clear. Moist oral mucosa. NECK:  Supple, no jugular venous distention. No thyroid enlargement, no tenderness.  LUNGS: Normal breath sounds bilaterally, no wheezing, rales,rhonchi or crepitation. No use of accessory muscles of respiration.  CARDIOVASCULAR: S1, S2 normal. No murmurs, rubs, or gallops.  ABDOMEN: Soft, non-tender, non-distended. Bowel sounds present. No organomegaly or mass.  EXTREMITIES: No pedal edema, cyanosis, or clubbing.  NEUROLOGIC: Cranial nerves II through XII are intact. Muscle strength 4/5 in all extremities. Sensation intact. Gait not checked.  PSYCHIATRIC: The patient is alert and oriented x 3.  SKIN: No obvious rash, lesion, or ulcer.   DATA REVIEW:   CBC  Recent Labs Lab 09/25/14 0448  WBC 6.2  HGB 11.3*  HCT 33.1*  PLT 159    Chemistries   Recent Labs Lab 09/27/14 0818  NA 141  K 5.0  CL 112*  CO2 24  GLUCOSE 109*  BUN 37*  CREATININE 1.68*  CALCIUM 9.0    Cardiac Enzymes  No results for input(s): TROPONINI in the last 168 hours.  Microbiology Results  Results for orders placed or performed in visit on 10/18/13  Culture, blood (single)     Status: None   Collection Time: 10/17/13 12:07 PM  Result Value Ref Range Status   Micro Text Report   Final       COMMENT                   NO GROWTH AEROBICALLY/ANAEROBICALLY IN 5 DAYS   ANTIBIOTIC                                                      Culture, blood (single)     Status: None   Collection Time: 10/17/13 12:07 PM  Result Value Ref Range Status   Micro Text Report   Final       COMMENT                   NO GROWTH AEROBICALLY/ANAEROBICALLY IN 5 DAYS   ANTIBIOTIC                                                        RADIOLOGY:  No results found.      Management plans discussed with the patient,  family and they are in agreement.  CODE STATUS:   TOTAL TIME TAKING CARE OF THIS PATIENT: 36 minutes.    Shaune Pollack M.D on 09/27/2014 at 5:44 PM  Between 7am to 6pm - Pager - [phone]  After 6pm go to [email or web address] - password [email or web address] [facility]  [facility] [place] [facility]  Office  [phone]  CC: Primary care physician; Ruel Favors, MD

## 2014-09-27 NOTE — Progress Notes (Signed)
Patient and spouse given discharge orders. Both verbalized understanding of instructions. All questions answered. Ambulates w/o difficulty. ABC intact. PIV d/c'd. No further needs. W/C to exit with volunteer and wife at side.

## 2014-09-27 NOTE — Discharge Instructions (Signed)
Low sodium and ADA diet. Activity as tolerated.  [facility] [phone]

## 2014-09-27 NOTE — Progress Notes (Signed)
Patient for discharge home today and in agreement with home health nursing.  Agency preference is [facility].  Referral called.  Patient's wife works during the day.  Patient's home is handicapped modified.  The phone number listed as patient's home phone is actually patient's wife's phone number.

## 2014-09-28 ENCOUNTER — Encounter: Payer: Self-pay | Admitting: Family Medicine

## 2014-09-28 DIAGNOSIS — G4733 Obstructive sleep apnea (adult) (pediatric): Secondary | ICD-10-CM | POA: Insufficient documentation

## 2014-09-28 NOTE — Telephone Encounter (Signed)
Wife returning VM.

## 2014-09-28 NOTE — Telephone Encounter (Signed)
Patient contacted regarding discharge from [facility] on 8/18.  Patient understands to follow up with provider Arida on 9/2 at 11:30am at [facility]. Patient understands discharge instructions?  Patient understands medications and regiment?  Patient understands to bring all medications to this visit?   Left detailed VM with CB number on pt phone.

## 2014-10-01 ENCOUNTER — Telehealth: Payer: Self-pay

## 2014-10-01 NOTE — Telephone Encounter (Signed)
Left VM message.

## 2014-10-01 NOTE — Telephone Encounter (Signed)
Patient contacted regarding discharge from [facility]  on 8/18.  Patient understands to follow up with provider Arida on 9/2 at 11:30am at [facility]. Patient understands discharge instructions? yes Patient understands medications and regiment? yes Patient understands to bring all medications to this visit? yes

## 2014-10-03 LAB — GLUCOSE, CAPILLARY
Glucose-Capillary: 110 mg/dL — ABNORMAL HIGH (ref 65–99)
Glucose-Capillary: 152 mg/dL — ABNORMAL HIGH (ref 65–99)

## 2014-10-08 ENCOUNTER — Other Ambulatory Visit: Payer: Self-pay | Admitting: Family Medicine

## 2014-10-08 NOTE — Telephone Encounter (Signed)
Patient requesting refill. 

## 2014-10-10 ENCOUNTER — Ambulatory Visit (INDEPENDENT_AMBULATORY_CARE_PROVIDER_SITE_OTHER): Payer: Medicaid Other | Admitting: Family Medicine

## 2014-10-10 ENCOUNTER — Encounter: Payer: Self-pay | Admitting: Family Medicine

## 2014-10-10 VITALS — BP 136/88 | HR 88 | Temp 97.6°F | Resp 18 | Ht 67.0 in | Wt 217.8 lb

## 2014-10-10 DIAGNOSIS — N189 Chronic kidney disease, unspecified: Secondary | ICD-10-CM

## 2014-10-10 DIAGNOSIS — F32A Depression, unspecified: Secondary | ICD-10-CM

## 2014-10-10 DIAGNOSIS — I951 Orthostatic hypotension: Secondary | ICD-10-CM

## 2014-10-10 DIAGNOSIS — Z23 Encounter for immunization: Secondary | ICD-10-CM | POA: Diagnosis not present

## 2014-10-10 DIAGNOSIS — E1122 Type 2 diabetes mellitus with diabetic chronic kidney disease: Secondary | ICD-10-CM | POA: Diagnosis not present

## 2014-10-10 DIAGNOSIS — E1165 Type 2 diabetes mellitus with hyperglycemia: Secondary | ICD-10-CM | POA: Diagnosis not present

## 2014-10-10 DIAGNOSIS — IMO0002 Reserved for concepts with insufficient information to code with codable children: Secondary | ICD-10-CM

## 2014-10-10 DIAGNOSIS — N183 Chronic kidney disease, stage 3 unspecified: Secondary | ICD-10-CM

## 2014-10-10 DIAGNOSIS — N179 Acute kidney failure, unspecified: Secondary | ICD-10-CM | POA: Diagnosis not present

## 2014-10-10 DIAGNOSIS — F329 Major depressive disorder, single episode, unspecified: Secondary | ICD-10-CM

## 2014-10-10 MED ORDER — ESCITALOPRAM OXALATE 10 MG PO TABS: 10.0000 mg | ORAL_TABLET | Freq: Every day | ORAL | Status: DC

## 2014-10-10 MED ORDER — QUETIAPINE FUMARATE 100 MG PO TABS: 100.0000 mg | ORAL_TABLET | Freq: Every day | ORAL | Status: DC

## 2014-10-10 NOTE — Progress Notes (Signed)
Name: Angel Cruz   MRN: [id number]    DOB: 11-Nov-1955   Date:10/10/2014       Progress Note  Subjective  Chief Complaint  Chief Complaint  Patient presents with  . Hospitalization Follow-up    Patient went into the ER on September 24, 2014 and discharged on September 27, 2014 for Acute Renal Failure. Patient was having dizziness and lightheadness and low BP. Patient had IV fluids while in the [facility] and was informed his Kidney Function was 15% when admitted and after fluids up to 44%. Patient has a follow up Friday with Dr. Fletcher Anon and with Kidney Doctor. Patient is feeling better and states he has been drinking 3-4 bottles of water daily.    HPI  Hospital follow up: admitted August 15th, for syncopal episode, secondary to orthostatic hypotension. Dx with acute renal failure. His carvedilol was held, and he was given  Midodrine. Since discharge he has seen Dr. Abigail Butts, GFR is up to 81 and he was advised to stop Metformin and consider coming off Seroquel to preserve kidney function. He is feeling well since discharge, no longer has dizziness. BP has been up and is only taking Midodrine if bp goes under 95 systolic. He has a follow up with cardiologist , Dr. Fletcher Anon this week to adjust/consider resuming beta-blocker since he had a history of MI one year ago and history of CHF.  DMII: he stopped taking Metformin at time of admission to [facility] on 08/15, still on Tresiba 10 units daily, following a diabetic diet, drinking water and fsbs at home has been well controlled. Fasting of below 140 except for one it was 149, post-prandially only above 180 once over the past 2 weeks. He denies polyphagia, polyuria or polydipsia.   Depression: Dr. Abigail Butts recommended to stop Seroquel, but I would hold off at this time. He has a history of major depression and symptoms are finally stable, he is on a low dose, if GFR does not improve we will reconsider adjusting medication    Patient Active Problem List   Diagnosis  Date Noted  . OSA (obstructive sleep apnea) 09/28/2014  . Anemia 09/14/2014  . Cardiomyopathy, ischemic   . Orthostatic hypotension 09/07/2014  . Angioedema 07/30/2014  . RLS (restless legs syndrome) 07/30/2014  . B12 deficiency 07/30/2014  . Vitamin D deficiency 07/30/2014  . Chronic systolic heart failure 67/20/9470  . Ischemic cardiomyopathy   . CAD (coronary artery disease)   . Hypertension   . Hyperlipidemia   . 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 Depression   . CKD (chronic kidney disease), stage III     Past Surgical History  Procedure Laterality Date  . Cardiac catheterization  10/19/2013    [facility]  . Uvulectomy  1995  . Atrial fibrillation ablation  1995  . Elbow surgery Left   . Rotator cuff repair    . Carpal tunnel release      Family History  Problem Relation Age of Onset  . Stroke Mother   . COPD Mother   . Heart failure Mother   . Heart attack Mother   . Colon cancer Father   . Diabetes Father   . Diabetes Brother   . Cancer Brother     Social History   Social History  . Marital Status: Married    Spouse Name: [name]  . Number of Children: 2  . Years of Education: N/A   Occupational History  . Not on file.   Social History  Main Topics  . Smoking status: Never Smoker   . Smokeless tobacco: Never Used  . Alcohol Use: No  . Drug Use: No  . Sexual Activity: No   Other Topics Concern  . Not on file   Social History Narrative     Current outpatient prescriptions:  .  aspirin EC 81 MG tablet, Take 81 mg by mouth daily., Disp: , Rfl:  .  Cyanocobalamin (VITAMIN B-12 PO), Take 1 tablet by mouth daily., Disp: , Rfl:  .  escitalopram (LEXAPRO) 10 MG tablet, Take 1 tablet (10 mg total) by mouth daily., Disp: 90 tablet, Rfl: 1 .  Insulin Degludec (TRESIBA FLEXTOUCH) 200 UNIT/ML SOPN, Inject 10 Units into the skin daily., Disp: 5 pen, Rfl: 2 .  midodrine (PROAMATINE) 10 MG tablet, Take 0.5 tablets (5 mg total) by mouth 3  (three) times daily as needed. (Patient taking differently: Take 5 mg by mouth 3 (three) times daily as needed (for low BP). ), Disp: 120 tablet, Rfl: 1 .  nitroGLYCERIN (NITROSTAT) 0.4 MG SL tablet, Place 0.4 mg under the tongue every 5 (five) minutes as needed for chest pain., Disp: , Rfl:  .  QUEtiapine (SEROQUEL) 100 MG tablet, Take 1 tablet (100 mg total) by mouth at bedtime., Disp: 90 tablet, Rfl: 0 .  rOPINIRole (REQUIP) 0.5 MG tablet, Take 1 tablet (0.5 mg total) by mouth at bedtime., Disp: 30 tablet, Rfl: 5 .  rosuvastatin (CRESTOR) 20 MG tablet, Take 1 tablet (20 mg total) by mouth at bedtime., Disp: 30 tablet, Rfl: 0  No Known Allergies   ROS  Constitutional: Negative for fever or weight change.  Respiratory: Negative for cough and shortness of breath.   Cardiovascular: Negative for chest pain or palpitations.  Gastrointestinal: Negative for abdominal pain, no bowel changes.  Musculoskeletal: Negative for gait problem or joint swelling.  Skin: Negative for rash.  Neurological: Negative for dizziness or headache.  No other specific complaints in a complete review of systems (except as listed in HPI above).  Objective  Filed Vitals:   10/10/14 0859  BP: 136/88  Pulse: 88  Temp: 97.6 F (36.4 C)  TempSrc: Oral  Resp: 18  Height: _0  (1.702 m)  Weight: 217 lb 12.8 oz (98.793 kg)  SpO2: 96%    Body mass index is 34.1 kg/(m^2).  Physical Exam  Constitutional: Patient appears well-developed and well-nourished. Obese No distress.  HEENT: head atraumatic, normocephalic, pupils equal and reactive to light, neck supple, throat within normal limits Cardiovascular: Normal rate, regular rhythm and normal heart sounds.  No murmur heard. No BLE edema. Varicose veins  Pulmonary/Chest: Effort normal and breath sounds normal. No respiratory distress. Abdominal: Soft.  There is no tenderness. Psychiatric: Patient has a normal mood and affect. behavior is normal. Judgment and  thought content normal.  Recent Results (from the past 2160 hour(s))  Lipid panel     Status: Abnormal   Collection Time: 07/31/14  9:38 AM  Result Value Ref Range   Cholesterol 153 0 - 200 mg/dL   Triglycerides 346 (H) <150 mg/dL   HDL 24 (L) >40 mg/dL   Total CHOL/HDL Ratio 6.4 RATIO   VLDL 69 (H) 0 - 40 mg/dL   LDL Cholesterol 60 0 - 99 mg/dL    Comment:        Total Cholesterol/HDL:CHD Risk Coronary Heart Disease Risk Table                     Men  Women  1/2 Average Risk   3.4   3.3  Average Risk       5.0   4.4  2 X Average Risk   9.6   7.1  3 X Average Risk  23.4   11.0        Use the calculated Patient Ratio above and the CHD Risk Table to determine the patient's CHD Risk.        ATP III CLASSIFICATION (LDL):  <100     mg/dL   Optimal  100-129  mg/dL   Near or Above                    Optimal  130-159  mg/dL   Borderline  160-189  mg/dL   High  >190     mg/dL   Very High   Comprehensive metabolic panel     Status: Abnormal   Collection Time: 07/31/14  9:38 AM  Result Value Ref Range   Sodium 136 135 - 145 mmol/L   Potassium 4.2 3.5 - 5.1 mmol/L   Chloride 108 101 - 111 mmol/L   CO2 25 22 - 32 mmol/L   Glucose, Bld 219 (H) 65 - 99 mg/dL   BUN 42 (H) 6 - 20 mg/dL   Creatinine, Ser 1.73 (H) 0.61 - 1.24 mg/dL   Calcium 9.3 8.9 - 10.3 mg/dL   Total Protein 6.3 (L) 6.5 - 8.1 g/dL   Albumin 3.6 3.5 - 5.0 g/dL   AST 18 15 - 41 U/L   ALT 22 17 - 63 U/L   Alkaline Phosphatase 63 38 - 126 U/L   Total Bilirubin 0.6 0.3 - 1.2 mg/dL   GFR calc non Af Amer 42 (L) >60 mL/min   GFR calc Af Amer 49 (L) >60 mL/min    Comment: (NOTE) The eGFR has been calculated using the CKD EPI equation. This calculation has not been validated in all clinical situations. eGFR's persistently <60 mL/min signify possible Chronic Kidney Disease.    Anion gap 3 (L) 5 - 15  Vit D  25 hydroxy (rtn osteoporosis monitoring)     Status: Abnormal   Collection Time: 07/31/14  9:38 AM  Result  Value Ref Range   Vit D, 25-Hydroxy 23.3 (L) 30.0 - 100.0 ng/mL    Comment: (NOTE) Vitamin D deficiency has been defined by the [place] practice guideline as a level of serum 25-OH vitamin D less than 20 ng/mL (1,2). The Endocrine Society went on to further define vitamin D insufficiency as a level between 21 and 29 ng/mL (2). 1. IOM (Institute of Medicine). 2010. Dietary reference   intakes for calcium and D. [place]: The   [employer]. 2. Holick MF, Binkley , Bischoff-Ferrari HA, et al.   Evaluation, treatment, and prevention of vitamin D   deficiency: an Endocrine Society clinical practice   guideline. JCEM. 2011 Jul; 96(7):1911-30. Performed At: [facility] [place], [place] [id number] Lindon Romp MD WU:[id number]   Hemoglobin A1c     Status: Abnormal   Collection Time: 07/31/14  9:38 AM  Result Value Ref Range   Hgb A1c MFr Bld 10.7 (H) 4.0 - 6.0 %  Vitamin B12     Status: None   Collection Time: 07/31/14  9:38 AM  Result Value Ref Range   Vitamin B-12 677 180 - 914 pg/mL    Comment: (NOTE) This assay is not validated for testing neonatal  or myeloproliferative syndrome specimens for Vitamin B12 levels. Performed at [facility]   Comprehensive metabolic panel     Status: Abnormal   Collection Time: 08/15/14  7:51 PM  Result Value Ref Range   Sodium 136 135 - 145 mmol/L   Potassium 4.9 3.5 - 5.1 mmol/L   Chloride 107 101 - 111 mmol/L   CO2 23 22 - 32 mmol/L   Glucose, Bld 159 (H) 65 - 99 mg/dL   BUN 40 (H) 6 - 20 mg/dL   Creatinine, Ser 1.39 (H) 0.61 - 1.24 mg/dL   Calcium 9.0 8.9 - 10.3 mg/dL   Total Protein 7.0 6.5 - 8.1 g/dL   Albumin 4.0 3.5 - 5.0 g/dL   AST 18 15 - 41 U/L   ALT 14 (L) 17 - 63 U/L   Alkaline Phosphatase 51 38 - 126 U/L   Total Bilirubin 0.3 0.3 - 1.2 mg/dL   GFR calc non Af Amer 55 (L) >60 mL/min   GFR calc Af Amer >60 >60 mL/min    Comment:  (NOTE) The eGFR has been calculated using the CKD EPI equation. This calculation has not been validated in all clinical situations. eGFR's persistently <60 mL/min signify possible Chronic Kidney Disease.    Anion gap 6 5 - 15  CBC with Differential     Status: Abnormal   Collection Time: 08/15/14  7:51 PM  Result Value Ref Range   WBC 5.5 3.8 - 10.6 K/uL   RBC 4.16 (L) 4.40 - 5.90 MIL/uL   Hemoglobin 12.2 (L) 13.0 - 18.0 g/dL   HCT 36.6 (L) 40.0 - 52.0 %   MCV 88.1 80.0 - 100.0 fL   MCH 29.3 26.0 - 34.0 pg   MCHC 33.3 32.0 - 36.0 g/dL   RDW 14.4 11.5 - 14.5 %   Platelets 170 150 - 440 K/uL   Neutrophils Relative % 60 %   Neutro Abs 3.3 1.4 - 6.5 K/uL   Lymphocytes Relative 30 %   Lymphs Abs 1.6 1.0 - 3.6 K/uL   Monocytes Relative 5 %   Monocytes Absolute 0.3 0.2 - 1.0 K/uL   Eosinophils Relative 4 %   Eosinophils Absolute 0.2 0 - 0.7 K/uL   Basophils Relative 1 %   Basophils Absolute 0.1 0 - 0.1 K/uL  Troponin I     Status: None   Collection Time: 08/15/14  7:51 PM  Result Value Ref Range   Troponin I <0.03 <0.031 ng/mL    Comment:        NO INDICATION OF MYOCARDIAL INJURY.   Basic metabolic panel     Status: Abnormal   Collection Time: 09/06/14  3:43 PM  Result Value Ref Range   Sodium 136 135 - 145 mmol/L   Potassium 5.3 (H) 3.5 - 5.1 mmol/L   Chloride 106 101 - 111 mmol/L   CO2 22 22 - 32 mmol/L   Glucose, Bld 176 (H) 65 - 99 mg/dL   BUN 27 (H) 6 - 20 mg/dL   Creatinine, Ser 1.74 (H) 0.61 - 1.24 mg/dL   Calcium 9.3 8.9 - 10.3 mg/dL   GFR calc non Af Amer 42 (L) >60 mL/min   GFR calc Af Amer 49 (L) >60 mL/min    Comment: (NOTE) The eGFR has been calculated using the CKD EPI equation. This calculation has not been validated in all clinical situations. eGFR's persistently <60 mL/min signify possible Chronic Kidney Disease.    Anion gap 8 5 - 15  CBC     Status: Abnormal   Collection Time: 09/06/14  3:43 PM  Result Value Ref Range   WBC 6.9 3.8 - 10.6 K/uL    RBC 4.28 (L) 4.40 - 5.90 MIL/uL   Hemoglobin 12.6 (L) 13.0 - 18.0 g/dL   HCT 38.1 (L) 40.0 - 52.0 %   MCV 89.1 80.0 - 100.0 fL   MCH 29.3 26.0 - 34.0 pg   MCHC 32.9 32.0 - 36.0 g/dL   RDW 14.5 11.5 - 14.5 %   Platelets 160 150 - 440 K/uL  Troponin I     Status: None   Collection Time: 09/06/14  3:43 PM  Result Value Ref Range   Troponin I <0.03 <0.031 ng/mL    Comment:        NO INDICATION OF MYOCARDIAL INJURY.   Potassium     Status: Abnormal   Collection Time: 09/06/14  6:46 PM  Result Value Ref Range   Potassium 5.6 (H) 3.5 - 5.1 mmol/L  Potassium     Status: Abnormal   Collection Time: 09/06/14  9:27 PM  Result Value Ref Range   Potassium 5.5 (H) 3.5 - 5.1 mmol/L  Basic Metabolic Panel (BMET)     Status: Abnormal   Collection Time: 09/07/14  9:57 AM  Result Value Ref Range   Glucose 145 (H) 65 - 99 mg/dL   BUN 30 (H) 6 - 24 mg/dL   Creatinine, Ser 1.79 (H) 0.76 - 1.27 mg/dL   GFR calc non Af Amer 41 (L) >59 mL/min/1.73   GFR calc Af Amer 48 (L) >59 mL/min/1.73   BUN/Creatinine Ratio 17 9 - 20   Sodium 138 134 - 144 mmol/L   Potassium 5.7 (H) 3.5 - 5.2 mmol/L   Chloride 103 97 - 108 mmol/L   CO2 20 18 - 29 mmol/L   Calcium 9.9 8.7 - 10.2 mg/dL  Troponin I     Status: None   Collection Time: 09/08/14  3:14 PM  Result Value Ref Range   Troponin I <0.03 <0.031 ng/mL    Comment:        NO INDICATION OF MYOCARDIAL INJURY.   CBC     Status: Abnormal   Collection Time: 09/08/14  3:14 PM  Result Value Ref Range   WBC 5.1 3.8 - 10.6 K/uL   RBC 3.94 (L) 4.40 - 5.90 MIL/uL   Hemoglobin 11.4 (L) 13.0 - 18.0 g/dL   HCT 34.9 (L) 40.0 - 52.0 %   MCV 88.4 80.0 - 100.0 fL   MCH 29.0 26.0 - 34.0 pg   MCHC 32.8 32.0 - 36.0 g/dL   RDW 13.7 11.5 - 14.5 %   Platelets 156 150 - 440 K/uL  Basic metabolic panel     Status: Abnormal   Collection Time: 09/08/14  3:14 PM  Result Value Ref Range   Sodium 136 135 - 145 mmol/L   Potassium 5.6 (H) 3.5 - 5.1 mmol/L   Chloride 109  101 - 111 mmol/L   CO2 23 22 - 32 mmol/L   Glucose, Bld 208 (H) 65 - 99 mg/dL   BUN 33 (H) 6 - 20 mg/dL   Creatinine, Ser 2.01 (H) 0.61 - 1.24 mg/dL   Calcium 9.7 8.9 - 10.3 mg/dL   GFR calc non Af Amer 35 (L) >60 mL/min   GFR calc Af Amer 41 (L) >60 mL/min    Comment: (NOTE) The eGFR has been calculated using the CKD EPI equation. This calculation has not  been validated in all clinical situations. eGFR's persistently <60 mL/min signify possible Chronic Kidney Disease.    Anion gap 4 (L) 5 - 15  Troponin I     Status: None   Collection Time: 09/08/14  9:43 PM  Result Value Ref Range   Troponin I <0.03 <0.031 ng/mL    Comment:        NO INDICATION OF MYOCARDIAL INJURY.   Lipid panel     Status: Abnormal   Collection Time: 09/09/14  2:25 AM  Result Value Ref Range   Cholesterol 93 0 - 200 mg/dL   Triglycerides 220 (H) <150 mg/dL   HDL 23 (L) >40 mg/dL   Total CHOL/HDL Ratio 4.0 RATIO   VLDL 44 (H) 0 - 40 mg/dL   LDL Cholesterol 26 0 - 99 mg/dL    Comment:        Total Cholesterol/HDL:CHD Risk Coronary Heart Disease Risk Table                     Men   Women  1/2 Average Risk   3.4   3.3  Average Risk       5.0   4.4  2 X Average Risk   9.6   7.1  3 X Average Risk  23.4   11.0        Use the calculated Patient Ratio above and the CHD Risk Table to determine the patient's CHD Risk.        ATP III CLASSIFICATION (LDL):  <100     mg/dL   Optimal  100-129  mg/dL   Near or Above                    Optimal  130-159  mg/dL   Borderline  160-189  mg/dL   High  >190     mg/dL   Very High   CBC     Status: Abnormal   Collection Time: 09/09/14  2:25 AM  Result Value Ref Range   WBC 4.7 3.8 - 10.6 K/uL   RBC 3.63 (L) 4.40 - 5.90 MIL/uL   Hemoglobin 10.6 (L) 13.0 - 18.0 g/dL   HCT 31.8 (L) 40.0 - 52.0 %   MCV 87.6 80.0 - 100.0 fL   MCH 29.2 26.0 - 34.0 pg   MCHC 33.3 32.0 - 36.0 g/dL   RDW 14.2 11.5 - 14.5 %   Platelets 125 (L) 150 - 440 K/uL  Basic metabolic panel      Status: Abnormal   Collection Time: 09/09/14  2:25 AM  Result Value Ref Range   Sodium 141 135 - 145 mmol/L   Potassium 4.3 3.5 - 5.1 mmol/L   Chloride 111 101 - 111 mmol/L   CO2 24 22 - 32 mmol/L   Glucose, Bld 187 (H) 65 - 99 mg/dL   BUN 29 (H) 6 - 20 mg/dL   Creatinine, Ser 1.69 (H) 0.61 - 1.24 mg/dL   Calcium 9.0 8.9 - 10.3 mg/dL   GFR calc non Af Amer 44 (L) >60 mL/min   GFR calc Af Amer 51 (L) >60 mL/min    Comment: (NOTE) The eGFR has been calculated using the CKD EPI equation. This calculation has not been validated in all clinical situations. eGFR's persistently <60 mL/min signify possible Chronic Kidney Disease.    Anion gap 6 5 - 15  Troponin I     Status: None   Collection Time: 09/09/14  2:36 AM  Result  Value Ref Range   Troponin I <0.03 <0.031 ng/mL    Comment:        NO INDICATION OF MYOCARDIAL INJURY.   Troponin I     Status: None   Collection Time: 09/09/14  8:24 AM  Result Value Ref Range   Troponin I <0.03 <0.031 ng/mL    Comment:        NO INDICATION OF MYOCARDIAL INJURY.   Glucose, capillary     Status: Abnormal   Collection Time: 09/09/14  8:47 PM  Result Value Ref Range   Glucose-Capillary 172 (H) 65 - 99 mg/dL  Basic metabolic panel     Status: Abnormal   Collection Time: 09/10/14  4:16 AM  Result Value Ref Range   Sodium 140 135 - 145 mmol/L   Potassium 4.3 3.5 - 5.1 mmol/L   Chloride 111 101 - 111 mmol/L   CO2 24 22 - 32 mmol/L   Glucose, Bld 179 (H) 65 - 99 mg/dL   BUN 24 (H) 6 - 20 mg/dL   Creatinine, Ser 1.30 (H) 0.61 - 1.24 mg/dL   Calcium 8.7 (L) 8.9 - 10.3 mg/dL   GFR calc non Af Amer 60 (L) >60 mL/min   GFR calc Af Amer >60 >60 mL/min    Comment: (NOTE) The eGFR has been calculated using the CKD EPI equation. This calculation has not been validated in all clinical situations. eGFR's persistently <60 mL/min signify possible Chronic Kidney Disease.    Anion gap 5 5 - 15  Glucose, capillary     Status: Abnormal   Collection  Time: 09/10/14 10:19 PM  Result Value Ref Range   Glucose-Capillary 182 (H) 65 - 99 mg/dL  Basic metabolic panel     Status: Abnormal   Collection Time: 09/24/14  5:56 PM  Result Value Ref Range   Sodium 136 135 - 145 mmol/L   Potassium 4.2 3.5 - 5.1 mmol/L   Chloride 101 101 - 111 mmol/L   CO2 23 22 - 32 mmol/L   Glucose, Bld 189 (H) 65 - 99 mg/dL   BUN 69 (H) 6 - 20 mg/dL   Creatinine, Ser 4.03 (H) 0.61 - 1.24 mg/dL   Calcium 9.9 8.9 - 10.3 mg/dL   GFR calc non Af Amer 15 (L) >60 mL/min   GFR calc Af Amer 18 (L) >60 mL/min    Comment: (NOTE) The eGFR has been calculated using the CKD EPI equation. This calculation has not been validated in all clinical situations. eGFR's persistently <60 mL/min signify possible Chronic Kidney Disease.    Anion gap 12 5 - 15  CBC     Status: Abnormal   Collection Time: 09/24/14  5:56 PM  Result Value Ref Range   WBC 7.6 3.8 - 10.6 K/uL   RBC 4.32 (L) 4.40 - 5.90 MIL/uL   Hemoglobin 12.3 (L) 13.0 - 18.0 g/dL   HCT 37.7 (L) 40.0 - 52.0 %   MCV 87.4 80.0 - 100.0 fL   MCH 28.6 26.0 - 34.0 pg   MCHC 32.7 32.0 - 36.0 g/dL   RDW 13.3 11.5 - 14.5 %   Platelets 202 150 - 440 K/uL  Urinalysis complete, with microscopic ([facility] only)     Status: Abnormal   Collection Time: 09/24/14  9:50 PM  Result Value Ref Range   Color, Urine YELLOW (A) YELLOW   APPearance CLOUDY (A) CLEAR   Glucose, UA NEGATIVE NEGATIVE mg/dL   Bilirubin Urine NEGATIVE NEGATIVE   Ketones, ur NEGATIVE  NEGATIVE mg/dL   Specific Gravity, Urine 1.018 1.005 - 1.030   Hgb urine dipstick 1+ (A) NEGATIVE   pH 5.0 5.0 - 8.0   Protein, ur 100 (A) NEGATIVE mg/dL   Nitrite NEGATIVE NEGATIVE   Leukocytes, UA NEGATIVE NEGATIVE   RBC / HPF 0-5 0 - 5 RBC/hpf   WBC, UA 0-5 0 - 5 WBC/hpf   Bacteria, UA RARE (A) NONE SEEN   Squamous Epithelial / LPF 0-5 (A) NONE SEEN   Mucous PRESENT    Hyaline Casts, UA PRESENT    Amorphous Crystal PRESENT   Glucose, capillary     Status: None    Collection Time: 09/24/14 10:18 PM  Result Value Ref Range   Glucose-Capillary 75 65 - 99 mg/dL  Basic metabolic panel     Status: Abnormal   Collection Time: 09/25/14  4:48 AM  Result Value Ref Range   Sodium 138 135 - 145 mmol/L   Potassium 4.1 3.5 - 5.1 mmol/L   Chloride 106 101 - 111 mmol/L   CO2 24 22 - 32 mmol/L   Glucose, Bld 183 (H) 65 - 99 mg/dL   BUN 64 (H) 6 - 20 mg/dL   Creatinine, Ser 3.36 (H) 0.61 - 1.24 mg/dL   Calcium 9.0 8.9 - 10.3 mg/dL   GFR calc non Af Amer 19 (L) >60 mL/min   GFR calc Af Amer 22 (L) >60 mL/min    Comment: (NOTE) The eGFR has been calculated using the CKD EPI equation. This calculation has not been validated in all clinical situations. eGFR's persistently <60 mL/min signify possible Chronic Kidney Disease.    Anion gap 8 5 - 15  CBC     Status: Abnormal   Collection Time: 09/25/14  4:48 AM  Result Value Ref Range   WBC 6.2 3.8 - 10.6 K/uL   RBC 3.83 (L) 4.40 - 5.90 MIL/uL   Hemoglobin 11.3 (L) 13.0 - 18.0 g/dL   HCT 33.1 (L) 40.0 - 52.0 %   MCV 86.5 80.0 - 100.0 fL   MCH 29.6 26.0 - 34.0 pg   MCHC 34.2 32.0 - 36.0 g/dL   RDW 13.4 11.5 - 14.5 %   Platelets 159 150 - 440 K/uL  Cortisol     Status: None   Collection Time: 09/25/14  4:48 AM  Result Value Ref Range   Cortisol, Plasma 5.4 ug/dL    Comment: (NOTE) AM    6.7 - 22.6 ug/dL PM   <10.0       ug/dL Performed at [facility]   Glucose, capillary     Status: Abnormal   Collection Time: 09/25/14  7:45 AM  Result Value Ref Range   Glucose-Capillary 142 (H) 65 - 99 mg/dL   Comment 1 Notify RN   Glucose, capillary     Status: Abnormal   Collection Time: 09/25/14 11:58 AM  Result Value Ref Range   Glucose-Capillary 205 (H) 65 - 99 mg/dL   Comment 1 Notify RN   Protein electrophoresis, serum     Status: Abnormal   Collection Time: 09/25/14  4:12 PM  Result Value Ref Range   Total Protein ELP 5.8 (L) 6.0 - 8.5 g/dL   Albumin ELP 3.2 2.9 - 4.4 g/dL   Alpha-1-Globulin 0.2  0.0 - 0.4 g/dL   Alpha-2-Globulin 0.9 0.4 - 1.0 g/dL   Beta Globulin 0.8 0.7 - 1.3 g/dL   Gamma Globulin 0.7 0.4 - 1.8 g/dL   M-Spike, % Not Observed Not Observed  g/dL   SPE Interp. Comment     Comment: (NOTE) The SPE pattern appears essentially unremarkable. Evidence of monoclonal protein is not apparent. Performed At: [facility] [place], [place] [id number] Lindon Romp MD [id number]    Comment Comment     Comment: (NOTE) Protein electrophoresis scan will follow via computer, mail, or courier delivery.    GLOBULIN, TOTAL 2.6 2.2 - 3.9 g/dL   A/G Ratio 1.2 0.7 - 1.7  Vit D  25 hydroxy (rtn osteoporosis monitoring)     Status: Abnormal   Collection Time: 09/25/14  4:12 PM  Result Value Ref Range   Vit D, 25-Hydroxy 15.2 (L) 30.0 - 100.0 ng/mL    Comment: (NOTE) Vitamin D deficiency has been defined by the [place] and an Endocrine Society practice guideline as a level of serum 25-OH vitamin D less than 20 ng/mL (1,2). The Endocrine Society went on to further define vitamin D insufficiency as a level between 21 and 29 ng/mL (2). 1. IOM (Institute of Medicine). 2010. Dietary reference   intakes for calcium and D. [place]: The   [employer]. 2. Holick MF, Binkley [place], Bischoff-Ferrari HA, et al.   Evaluation, treatment, and prevention of vitamin D   deficiency: an Endocrine Society clinical practice   guideline. JCEM. 2011 Jul; 96(7):1911-30. Performed At: [facility] [place], [place] [id number] Lindon Romp MD [id number]   Parathyroid hormone, intact (no Ca)     Status: None   Collection Time: 09/25/14  4:12 PM  Result Value Ref Range   PTH 30 15 - 65 pg/mL    Comment: (NOTE) Performed At: [facility] [place], [place] [id number] Lindon Romp MD [id number]   Kappa/lambda light chains     Status: Abnormal   Collection Time: 09/25/14  4:12 PM   Result Value Ref Range   Kappa free light chain 47.11 (H) 3.30 - 19.40 mg/L   Lamda free light chains 30.05 (H) 5.71 - 26.30 mg/L   Kappa, lamda light chain ratio 1.57 0.26 - 1.65    Comment: (NOTE) Performed At: [facility] [place], [place] [id number] Lindon Romp MD [id number]   ANA w/Reflex     Status: None   Collection Time: 09/25/14  4:12 PM  Result Value Ref Range   Anit Nuclear Antibody(ANA) Negative Negative    Comment: (NOTE) Performed At: [facility] [address] [place], [place] [id number] Lindon Romp MD [id number]   Glucose, capillary     Status: Abnormal   Collection Time: 09/25/14  4:23 PM  Result Value Ref Range   Glucose-Capillary 156 (H) 65 - 99 mg/dL   Comment 1 Notify RN   IFE, Urine (with Tot Prot)     Status: Abnormal   Collection Time: 09/25/14  4:29 PM  Result Value Ref Range   Time 24 hours   Total Protein, Urine 95.2 Not Estab. mg/dL   Total Protein, Urine-Ur/day Comment 30.0 - 150.0 mg/24 hr    Comment: No total volume submitted.  Unable to calculate 24 hour result.   Albumin, U 56.8 %   ALPHA 1 URINE 1.4 %   Alpha 2, Urine 8.4 %   % Beta 24.9 %   GAMMA GLOBULIN URINE 8.5 %   Free Lt Chn Excr Rate 568.00 (H) 1.35 - 24.19 mg/L    Comment: **Results verified by repeat testing**   Free Lambda Lt Chains,Ur 71.60 (  H) 0.24 - 6.66 mg/L   Free Kappa/Lambda Ratio 7.93 2.04 - 10.37    Comment: (NOTE) Performed At: [facility] [place], [place] [id number] Lindon Romp MD ZJ:[id number]    Immunofixation Result, Urine Comment     Comment: An apparent normal immunofixation pattern.   M-Spike, % Not Observed Not Observed %   M-Spike, Mg/24 Hr Comment Not Observed mg/24 hr    Comment: No total volume submitted.  Unable to calculate 24 hour result.   Note: Comment     Comment: (NOTE) Protein electrophoresis scan will follow via computer, mail, or courier delivery.   Glucose,  capillary     Status: Abnormal   Collection Time: 09/25/14  7:36 PM  Result Value Ref Range   Glucose-Capillary 205 (H) 65 - 99 mg/dL  Renal function panel     Status: Abnormal   Collection Time: 09/26/14  4:36 AM  Result Value Ref Range   Sodium 142 135 - 145 mmol/L   Potassium 5.1 3.5 - 5.1 mmol/L   Chloride 112 (H) 101 - 111 mmol/L   CO2 25 22 - 32 mmol/L   Glucose, Bld 110 (H) 65 - 99 mg/dL   BUN 50 (H) 6 - 20 mg/dL   Creatinine, Ser 2.07 (H) 0.61 - 1.24 mg/dL   Calcium 8.9 8.9 - 10.3 mg/dL   Phosphorus 3.1 2.5 - 4.6 mg/dL   Albumin 3.6 3.5 - 5.0 g/dL   GFR calc non Af Amer 34 (L) >60 mL/min   GFR calc Af Amer 40 (L) >60 mL/min    Comment: (NOTE) The eGFR has been calculated using the CKD EPI equation. This calculation has not been validated in all clinical situations. eGFR's persistently <60 mL/min signify possible Chronic Kidney Disease.    Anion gap 5 5 - 15  Iron and TIBC     Status: None   Collection Time: 09/26/14  4:36 AM  Result Value Ref Range   Iron 79 45 - 182 ug/dL   TIBC 287 250 - 450 ug/dL   Saturation Ratios 28 17.9 - 39.5 %   UIBC 208 ug/dL  Ferritin     Status: None   Collection Time: 09/26/14  4:36 AM  Result Value Ref Range   Ferritin 75 24 - 336 ng/mL  Glucose, capillary     Status: None   Collection Time: 09/26/14  7:19 AM  Result Value Ref Range   Glucose-Capillary 95 65 - 99 mg/dL   Comment 1 Notify RN   Glucose, capillary     Status: Abnormal   Collection Time: 09/26/14 11:13 AM  Result Value Ref Range   Glucose-Capillary 246 (H) 65 - 99 mg/dL   Comment 1 Notify RN   Glucose, capillary     Status: Abnormal   Collection Time: 09/26/14  4:04 PM  Result Value Ref Range   Glucose-Capillary 184 (H) 65 - 99 mg/dL   Comment 1 Notify RN   Glucose, capillary     Status: Abnormal   Collection Time: 09/26/14  8:50 PM  Result Value Ref Range   Glucose-Capillary 163 (H) 65 - 99 mg/dL  Glucose, capillary     Status: Abnormal   Collection Time:  09/27/14  7:28 AM  Result Value Ref Range   Glucose-Capillary 110 (H) 65 - 99 mg/dL   Comment 1 Notify RN   Basic metabolic panel     Status: Abnormal   Collection Time: 09/27/14  8:18 AM  Result Value Ref Range  Sodium 141 135 - 145 mmol/L   Potassium 5.0 3.5 - 5.1 mmol/L   Chloride 112 (H) 101 - 111 mmol/L   CO2 24 22 - 32 mmol/L   Glucose, Bld 109 (H) 65 - 99 mg/dL   BUN 37 (H) 6 - 20 mg/dL   Creatinine, Ser 1.68 (H) 0.61 - 1.24 mg/dL   Calcium 9.0 8.9 - 10.3 mg/dL   GFR calc non Af Amer 44 (L) >60 mL/min   GFR calc Af Amer 51 (L) >60 mL/min    Comment: (NOTE) The eGFR has been calculated using the CKD EPI equation. This calculation has not been validated in all clinical situations. eGFR's persistently <60 mL/min signify possible Chronic Kidney Disease.    Anion gap 5 5 - 15  Glucose, capillary     Status: Abnormal   Collection Time: 09/27/14 11:28 AM  Result Value Ref Range   Glucose-Capillary 152 (H) 65 - 99 mg/dL   Comment 1 Notify RN      PHQ2/9: Depression screen [facility] 2/9 10/10/2014 07/30/2014  Decreased Interest 0 1  Down, Depressed, Hopeless 0 1  PHQ - 2 Score 0 2  Altered sleeping - 3  Tired, decreased energy - 2  Change in appetite - 2  Feeling bad or failure about yourself  - 1  Trouble concentrating - 1  Moving slowly or fidgety/restless - 2  Suicidal thoughts - 0  PHQ-9 Score - 13  Difficult doing work/chores - Somewhat difficult    Fall Risk: Fall Risk  10/10/2014 07/30/2014  Falls in the past year? No No      Assessment & Plan  1. Orthostatic hypotension Resolved, reason for Acute kidney failure, under cardiologist and nephrologist care  2. Acute renal failure, unspecified acute renal failure type Resolved, good urine output now  3. CKD (chronic kidney disease), stage III Keep follow up with Dr. Abigail Butts  4. 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 Needs flu shot  - Flu Vaccine QUAD 36+ mos IM  6. Depression Doing well on medication, we will continue current regiment, he does no want to see psychiatrist at this time. Denies suicidal thoughts or ideation  - QUEtiapine (SEROQUEL) 100 MG tablet; Take 1 tablet (100 mg total) by mouth at bedtime.  Dispense: 90 tablet; Refill: 0 - escitalopram (LEXAPRO) 10 MG tablet; Take 1 tablet (10 mg total) by mouth daily.  Dispense: 90 tablet; Refill: 1

## 2014-10-12 ENCOUNTER — Ambulatory Visit (INDEPENDENT_AMBULATORY_CARE_PROVIDER_SITE_OTHER): Payer: Medicaid Other | Admitting: Cardiovascular Disease

## 2014-10-12 ENCOUNTER — Ambulatory Visit: Payer: Medicaid Other | Admitting: Cardiovascular Disease

## 2014-10-12 ENCOUNTER — Encounter: Payer: Self-pay | Admitting: Cardiovascular Disease

## 2014-10-12 VITALS — BP 138/78 | HR 68 | Ht 67.0 in | Wt 223.2 lb

## 2014-10-12 DIAGNOSIS — I5022 Chronic systolic (congestive) heart failure: Secondary | ICD-10-CM

## 2014-10-12 DIAGNOSIS — I951 Orthostatic hypotension: Secondary | ICD-10-CM | POA: Diagnosis not present

## 2014-10-12 DIAGNOSIS — I2510 Atherosclerotic heart disease of native coronary artery without angina pectoris: Secondary | ICD-10-CM

## 2014-10-12 NOTE — Assessment & Plan Note (Signed)
He has no symptoms of angina. Continue medical therapy. 

## 2014-10-12 NOTE — Assessment & Plan Note (Signed)
Most recent ejection fraction improved to normal. He is currently not in any heart failure medications due to recurrent episodes of symptomatic hypotension. Once his blood pressure stabilizes, I will plan on resuming small dose carvedilol and then a small dose ARB. Given the improvement in his EF and recurrent episodes of hypotension, I do not plan on resuming Entersto.  I recommend avoiding diuretics given recurrent episodes of volume depletion.

## 2014-10-12 NOTE — Progress Notes (Signed)
Primary care physician: Dr. Carlynn Purl Nephrologist: Dr. Wynelle Link  HPI   59 year old male who is here today for a followup visit regarding coronary artery disease and chronic systolic heart failure. He was admitted to [facility] in September, 2015 with complaints of cough and chest pain.  He ruled out for myocardial infarction.  He underwent stress testing which revealed apical infarct with peri-infarct ischemia.  EF was 31%.  Echocardiogram showed EF 35-40% with anteroseptal, anterior, apical wall motion abnormalities. He underwent cardiac catheterization which revealed an occluded mid LAD with otherwise nonobstructive CAD.  There were faint right to left collaterals supplying the distal LAD distribution.   The patient was treated with heart failure medications which ultimately had to be stopped due to recurrent episodes of symptomatic hypotension. He had multiple admissions over the last few months with nausea, vomiting, hypotension and acute on chronic renal failure. 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 During most recent admission, his creatinine went up to 3.5. Since hospital discharge, he has been monitoring his blood pressure with no episodes of hypotension. He claims that he drinks enough fluid. He is still off all antihypertensives medications. He had a repeat echocardiogram during his recent hospitalization which showed improvement in LV systolic function to normal. I personally reviewed the echocardiogram.    No Known Allergies   Current Outpatient Prescriptions on File Prior to Visit  Medication Sig Dispense Refill  . aspirin EC 81 MG tablet Take 81 mg by mouth daily.    . Cyanocobalamin (VITAMIN B-12 PO) Take 1 tablet by mouth daily.    Marland Kitchen escitalopram (LEXAPRO) 10 MG tablet Take 1 tablet (10 mg total) by mouth daily. 90 tablet 1  . Insulin Degludec (TRESIBA FLEXTOUCH) 200 UNIT/ML SOPN Inject 10  Units into the skin daily. 5 pen 2  . midodrine (PROAMATINE) 10 MG tablet Take 0.5 tablets (5 mg total) by mouth 3 (three) times daily as needed. (Patient taking differently: Take 5 mg by mouth 3 (three) times daily as needed (for low BP). ) 120 tablet 1  . nitroGLYCERIN (NITROSTAT) 0.4 MG SL tablet Place 0.4 mg under the tongue every 5 (five) minutes as needed for chest pain.    Marland Kitchen QUEtiapine (SEROQUEL) 100 MG tablet Take 1 tablet (100 mg total) by mouth at bedtime. 90 tablet 0  . rOPINIRole (REQUIP) 0.5 MG tablet Take 1 tablet (0.5 mg total) by mouth at bedtime. 30 tablet 5  . rosuvastatin (CRESTOR) 20 MG tablet Take 1 tablet (20 mg total) by mouth at bedtime. 30 tablet 0   No current facility-administered medications on file prior to visit.     Past Medical History  Diagnosis Date  . Diabetes mellitus without complication   . Hypertension   . Hyperlipidemia   . Depression   . Rotator cuff tear   . CKD (chronic kidney disease), stage III   . Ischemic cardiomyopathy     a. 10/2013 Echo: EF 35-40%, severe distal anteroseptal, anterior, and apical HK. Diast dysfxn, mild conc LVH, mildly dil LA, mild Ao sclerosis w/o stenosis.  Marland Kitchen CAD (coronary artery disease)     a. 10/2013 Lexi MV: EF 31%, apical, septal, ant-apical, inf-apical, lat-apical scar, septal and apical peri-infarct ischemia;  b. 10/2013 Cath: LM nl, LAD 20ost, 168m (faint R->L collats), D1 80p, LCX min irregs, OM1 min irregs, OM2 nl, OM3 30p, RCA 20p, PDA 60, RPL min irregs-->Med Rx.  . Morbid obesity   .  CHF (congestive heart failure)   . OSA (obstructive sleep apnea)   . Sleep apnea     recent test confirmed diagnosis, was performed at the sleep center across the street  . Hypotension      Past Surgical History  Procedure Laterality Date  . Cardiac catheterization  10/19/2013    [facility]  . Uvulectomy  1995  . Atrial fibrillation ablation  1995  . Elbow surgery Left   . Rotator cuff repair    . Carpal tunnel release        Family History  Problem Relation Age of Onset  . Stroke Mother   . COPD Mother   . Heart failure Mother   . Heart attack Mother   . Colon cancer Father   . Diabetes Father   . Diabetes Brother   . Cancer Brother      Social History   Social History  . Marital Status: Married    Spouse Name: Angelique Blonder  . Number of Children: 2  . Years of Education: N/A   Occupational History  . Not on file.   Social History Main Topics  . Smoking status: Never Smoker   . Smokeless tobacco: Never Used  . Alcohol Use: No  . Drug Use: No  . Sexual Activity: No   Other Topics Concern  . Not on file   Social History Narrative     PHYSICAL EXAM   BP 138/78 mmHg  Pulse 68  Ht  (1.702 m)  Wt 223 lb 4 oz (101.266 kg)  BMI 34.96 kg/m2 Constitutional: He is oriented to person, place, and time. He appears well-developed and well-nourished. No distress.  HENT: No nasal discharge.  Head: Normocephalic and atraumatic.  Eyes: Pupils are equal and round.  No discharge. Neck: Normal range of motion. Neck supple. No JVD present. No thyromegaly present.  Cardiovascular: Normal rate, regular rhythm, normal heart sounds. Exam reveals no gallop and no friction rub. No murmur heard.  Pulmonary/Chest: Effort normal and breath sounds normal. No stridor. No respiratory distress. He has no wheezes. He has no rales. He exhibits no tenderness.  Abdominal: Soft. Bowel sounds are normal. He exhibits no distension. There is no tenderness. There is no rebound and no guarding.  Musculoskeletal: Normal range of motion. He exhibits no edema and no tenderness.  Neurological: He is alert and oriented to person, place, and time. Coordination normal.  Skin: Skin is warm and dry. No rash noted. He is not diaphoretic. No erythema. No pallor.  Psychiatric: He has a normal mood and affect. His behavior is normal. Judgment and thought content normal.    ZOX:WRUEA  Rhythm  -  Nonspecific T-abnormality.    ABNORMAL   ASSESSMENT AND PLAN

## 2014-10-12 NOTE — Assessment & Plan Note (Signed)
Given recurrent episodes of hypotension and known history of orthostatic hypotension, I strongly recommend substituting Seroquel and Requip as both can cause significant orthostatic hypotension. He has a follow-up appointment with Dr.Sowles.

## 2014-10-12 NOTE — Patient Instructions (Signed)
Medication Instructions:  Your physician recommends that you continue on your current medications as directed. Please refer to the Current Medication list given to you today.   Labwork: none  Testing/Procedures: none  Follow-Up: Your physician wants you to follow-up in: two months with Dr. Kirke Corin.  You will receive a reminder letter in the mail two months in advance. If you don't receive a letter, please call our office to schedule the follow-up appointment.   Any Other Special Instructions Will Be Listed Below (If Applicable).

## 2014-10-16 ENCOUNTER — Ambulatory Visit: Payer: Medicaid Other | Admitting: Family Medicine

## 2014-10-16 ENCOUNTER — Telehealth: Payer: Self-pay | Admitting: Cardiovascular Disease

## 2014-10-16 NOTE — Telephone Encounter (Signed)
Continue to monitor and update [place] in 1 week.

## 2014-10-16 NOTE — Telephone Encounter (Signed)
Blood Pressure  (digital calibrated)    Morning 177/102 hr 71    Lunch   167/104 hr 71         Afternoon 4 pm    154/90 hr 82    Patient denies cp to spouse but has been having some lower back pain.

## 2014-10-16 NOTE — Telephone Encounter (Signed)
Forward to Arida 

## 2014-10-17 ENCOUNTER — Telehealth: Payer: Self-pay | Admitting: Family Medicine

## 2014-10-17 NOTE — Telephone Encounter (Signed)
Conception [place]ms with [facility] called concerning patient's elevated blood pressure, which was as follows: 10-15-14 - 170/100 10-16-14- 177/102 10-17-14 - 138/85 (pt took this one with his blood pressure machine in the early AM) 10-17-14- 120/70 (taken at 10am using Home Health machine)  Sherri would like a verbal order to continue service in order to monitor patient's blood pressure.

## 2014-10-17 NOTE — Telephone Encounter (Signed)
S/w pt regarding recommendations. Pt in agreement to monitor and record BP x 1 week and CB to report readings.

## 2014-10-22 ENCOUNTER — Other Ambulatory Visit: Payer: Self-pay | Admitting: Family Medicine

## 2014-10-23 ENCOUNTER — Telehealth: Payer: Self-pay | Admitting: Family Medicine

## 2014-10-23 ENCOUNTER — Telehealth: Payer: Self-pay

## 2014-10-23 ENCOUNTER — Other Ambulatory Visit: Payer: Self-pay

## 2014-10-23 MED ORDER — CARVEDILOL 3.125 MG PO TABS: 3.1250 mg | ORAL_TABLET | Freq: Two times a day (BID) | ORAL | Status: DC

## 2014-10-23 NOTE — Telephone Encounter (Signed)
Forward to Arida 

## 2014-10-23 NOTE — Telephone Encounter (Signed)
Left voicemail of verbal order for [facility] to continue mointoring

## 2014-10-23 NOTE — Telephone Encounter (Signed)
Pt is calling back with an update on his BP 9/12- 192/100, last night 169/98 9/13- 179/101

## 2014-10-23 NOTE — Telephone Encounter (Signed)
S/w pt regarding recommendations regarding coreg. Pt states he is not taking midodrine. Advised pt to take BP before taking coreg and an hour after, record readings and notify us if BP runs low. Pt verbalized understanding with no further questions.

## 2014-10-23 NOTE — Telephone Encounter (Signed)
Resume Coreg 3.125 mg bid. Make sure he is not taking Midodrin.

## 2014-10-23 NOTE — Telephone Encounter (Signed)
Sherri from [facility] requesting return call: requesting additional visits to monitor blood pressure and blood sugars levels [phone]

## 2014-10-30 ENCOUNTER — Emergency Department: Payer: Medicaid Other

## 2014-10-30 ENCOUNTER — Encounter: Payer: Self-pay | Admitting: Radiology

## 2014-10-30 ENCOUNTER — Observation Stay
Admission: EM | Admit: 2014-10-30 | Discharge: 2014-10-31 | Disposition: A | Discharge status: Home / Self Care | Payer: Medicaid Other | Attending: Internal Medicine | Admitting: Internal Medicine

## 2014-10-30 DIAGNOSIS — E559 Vitamin D deficiency, unspecified: Secondary | ICD-10-CM | POA: Insufficient documentation

## 2014-10-30 DIAGNOSIS — G4733 Obstructive sleep apnea (adult) (pediatric): Secondary | ICD-10-CM | POA: Diagnosis not present

## 2014-10-30 DIAGNOSIS — E1122 Type 2 diabetes mellitus with diabetic chronic kidney disease: Secondary | ICD-10-CM | POA: Diagnosis not present

## 2014-10-30 DIAGNOSIS — Z7982 Long term (current) use of aspirin: Secondary | ICD-10-CM | POA: Diagnosis not present

## 2014-10-30 DIAGNOSIS — Z6834 Body mass index (BMI) 34.0-34.9, adult: Secondary | ICD-10-CM | POA: Diagnosis not present

## 2014-10-30 DIAGNOSIS — R079 Chest pain, unspecified: Secondary | ICD-10-CM | POA: Insufficient documentation

## 2014-10-30 DIAGNOSIS — Z800 Family history of malignant neoplasm of digestive organs: Secondary | ICD-10-CM | POA: Insufficient documentation

## 2014-10-30 DIAGNOSIS — I2510 Atherosclerotic heart disease of native coronary artery without angina pectoris: Secondary | ICD-10-CM | POA: Insufficient documentation

## 2014-10-30 DIAGNOSIS — N183 Chronic kidney disease, stage 3 unspecified: Secondary | ICD-10-CM | POA: Diagnosis present

## 2014-10-30 DIAGNOSIS — I10 Essential (primary) hypertension: Secondary | ICD-10-CM

## 2014-10-30 DIAGNOSIS — G2581 Restless legs syndrome: Secondary | ICD-10-CM | POA: Diagnosis not present

## 2014-10-30 DIAGNOSIS — M549 Dorsalgia, unspecified: Secondary | ICD-10-CM | POA: Diagnosis present

## 2014-10-30 DIAGNOSIS — E538 Deficiency of other specified B group vitamins: Secondary | ICD-10-CM | POA: Insufficient documentation

## 2014-10-30 DIAGNOSIS — R002 Palpitations: Secondary | ICD-10-CM | POA: Insufficient documentation

## 2014-10-30 DIAGNOSIS — Z833 Family history of diabetes mellitus: Secondary | ICD-10-CM | POA: Insufficient documentation

## 2014-10-30 DIAGNOSIS — E1165 Type 2 diabetes mellitus with hyperglycemia: Secondary | ICD-10-CM | POA: Insufficient documentation

## 2014-10-30 DIAGNOSIS — I951 Orthostatic hypotension: Secondary | ICD-10-CM | POA: Insufficient documentation

## 2014-10-30 DIAGNOSIS — Z8249 Family history of ischemic heart disease and other diseases of the circulatory system: Secondary | ICD-10-CM | POA: Diagnosis not present

## 2014-10-30 DIAGNOSIS — Z808 Family history of malignant neoplasm of other organs or systems: Secondary | ICD-10-CM | POA: Insufficient documentation

## 2014-10-30 DIAGNOSIS — T783XXA Angioneurotic edema, initial encounter: Secondary | ICD-10-CM | POA: Diagnosis not present

## 2014-10-30 DIAGNOSIS — Z823 Family history of stroke: Secondary | ICD-10-CM | POA: Diagnosis not present

## 2014-10-30 DIAGNOSIS — M542 Cervicalgia: Secondary | ICD-10-CM | POA: Diagnosis not present

## 2014-10-30 DIAGNOSIS — I130 Hypertensive heart and chronic kidney disease with heart failure and stage 1 through stage 4 chronic kidney disease, or unspecified chronic kidney disease: Secondary | ICD-10-CM | POA: Insufficient documentation

## 2014-10-30 DIAGNOSIS — I509 Heart failure, unspecified: Secondary | ICD-10-CM | POA: Diagnosis not present

## 2014-10-30 DIAGNOSIS — Z79899 Other long term (current) drug therapy: Secondary | ICD-10-CM | POA: Diagnosis not present

## 2014-10-30 DIAGNOSIS — D649 Anemia, unspecified: Secondary | ICD-10-CM | POA: Diagnosis not present

## 2014-10-30 DIAGNOSIS — E785 Hyperlipidemia, unspecified: Secondary | ICD-10-CM | POA: Insufficient documentation

## 2014-10-30 DIAGNOSIS — Z794 Long term (current) use of insulin: Secondary | ICD-10-CM | POA: Insufficient documentation

## 2014-10-30 DIAGNOSIS — R51 Headache: Secondary | ICD-10-CM | POA: Diagnosis not present

## 2014-10-30 DIAGNOSIS — I255 Ischemic cardiomyopathy: Secondary | ICD-10-CM | POA: Diagnosis not present

## 2014-10-30 DIAGNOSIS — I160 Hypertensive urgency: Secondary | ICD-10-CM

## 2014-10-30 DIAGNOSIS — X58XXXA Exposure to other specified factors, initial encounter: Secondary | ICD-10-CM | POA: Insufficient documentation

## 2014-10-30 DIAGNOSIS — F329 Major depressive disorder, single episode, unspecified: Secondary | ICD-10-CM | POA: Diagnosis not present

## 2014-10-30 LAB — BASIC METABOLIC PANEL
Anion gap: 8 (ref 5–15)
BUN: 44 mg/dL — ABNORMAL HIGH (ref 6–20)
CALCIUM: 9.6 mg/dL (ref 8.9–10.3)
CO2: 22 mmol/L (ref 22–32)
CREATININE: 1.29 mg/dL — AB (ref 0.61–1.24)
Chloride: 108 mmol/L (ref 101–111)
GFR calc non Af Amer: >60 mL/min (ref >60)
Glucose, Bld: 163 mg/dL — ABNORMAL HIGH (ref 65–99)
Potassium: 5.2 mmol/L — ABNORMAL HIGH (ref 3.5–5.1)
SODIUM: 138 mmol/L (ref 135–145)

## 2014-10-30 LAB — CBC
HCT: 33.8 % — ABNORMAL LOW (ref 40.0–52.0)
Hemoglobin: 11.4 g/dL — ABNORMAL LOW (ref 13.0–18.0)
MCH: 29.8 pg (ref 26.0–34.0)
MCHC: 33.7 g/dL (ref 32.0–36.0)
MCV: 88.5 fL (ref 80.0–100.0)
PLATELETS: 187 10*3/uL (ref 150–440)
RBC: 3.82 MIL/uL — ABNORMAL LOW (ref 4.40–5.90)
RDW: 14.1 % (ref 11.5–14.5)
WBC: 6.6 10*3/uL (ref 3.8–10.6)

## 2014-10-30 LAB — TROPONIN I

## 2014-10-30 MED ORDER — IOHEXOL 350 MG/ML SOLN
100.0000 mL | Freq: Once | INTRAVENOUS | Status: AC | PRN
  Administered 2014-10-30: 100 mL via INTRAVENOUS

## 2014-10-30 NOTE — ED Notes (Signed)
Pt in with htn was taken off bp meds due to hypotension.  Started back on meds last week, bp at home today was elevated.  States has had palpitations, headache and neck pain.

## 2014-10-31 ENCOUNTER — Telehealth: Payer: Self-pay | Admitting: *Deleted

## 2014-10-31 DIAGNOSIS — I10 Essential (primary) hypertension: Secondary | ICD-10-CM | POA: Diagnosis present

## 2014-10-31 LAB — TROPONIN I: Troponin I: <0.03 ng/mL (ref <0.031)

## 2014-10-31 LAB — BASIC METABOLIC PANEL
ANION GAP: 5 (ref 5–15)
BUN: 40 mg/dL — ABNORMAL HIGH (ref 6–20)
CALCIUM: 9.3 mg/dL (ref 8.9–10.3)
CHLORIDE: 110 mmol/L (ref 101–111)
CO2: 25 mmol/L (ref 22–32)
Creatinine, Ser: 1.22 mg/dL (ref 0.61–1.24)
GFR calc non Af Amer: >60 mL/min (ref >60)
Glucose, Bld: 129 mg/dL — ABNORMAL HIGH (ref 65–99)
POTASSIUM: 4.4 mmol/L (ref 3.5–5.1)
Sodium: 140 mmol/L (ref 135–145)

## 2014-10-31 LAB — CBC
HEMATOCRIT: 32.7 % — AB (ref 40.0–52.0)
HEMOGLOBIN: 11 g/dL — AB (ref 13.0–18.0)
MCH: 29.5 pg (ref 26.0–34.0)
MCHC: 33.6 g/dL (ref 32.0–36.0)
MCV: 87.9 fL (ref 80.0–100.0)
Platelets: 163 10*3/uL (ref 150–440)
RBC: 3.73 MIL/uL — AB (ref 4.40–5.90)
RDW: 13.7 % (ref 11.5–14.5)
WBC: 5.7 10*3/uL (ref 3.8–10.6)

## 2014-10-31 LAB — HEMOGLOBIN A1C: HEMOGLOBIN A1C: 7.6 % — AB (ref 4.0–6.0)

## 2014-10-31 LAB — GLUCOSE, CAPILLARY
Glucose-Capillary: 111 mg/dL — ABNORMAL HIGH (ref 65–99)
Glucose-Capillary: 136 mg/dL — ABNORMAL HIGH (ref 65–99)

## 2014-10-31 MED ORDER — AMLODIPINE BESYLATE 10 MG PO TABS
10.0000 mg | ORAL_TABLET | Freq: Every day | ORAL | Status: DC
  Administered 2014-10-31: 10 mg via ORAL
  Filled 2014-10-31: qty 1

## 2014-10-31 MED ORDER — ONDANSETRON HCL 4 MG PO TABS: 4.0000 mg | ORAL_TABLET | Freq: Four times a day (QID) | ORAL | Status: DC | PRN

## 2014-10-31 MED ORDER — ACETAMINOPHEN 650 MG RE SUPP: 650.0000 mg | Freq: Four times a day (QID) | RECTAL | Status: DC | PRN

## 2014-10-31 MED ORDER — CLONIDINE HCL 0.1 MG PO TABS
0.1000 mg | ORAL_TABLET | Freq: Once | ORAL | Status: AC
  Administered 2014-10-31: 0.1 mg via ORAL
  Filled 2014-10-31: qty 1

## 2014-10-31 MED ORDER — ESCITALOPRAM OXALATE 10 MG PO TABS
10.0000 mg | ORAL_TABLET | Freq: Every day | ORAL | Status: DC
  Administered 2014-10-31: 10 mg via ORAL
  Filled 2014-10-31: qty 1

## 2014-10-31 MED ORDER — ROSUVASTATIN CALCIUM 20 MG PO TABS: 20.0000 mg | ORAL_TABLET | Freq: Every day | ORAL | Status: DC

## 2014-10-31 MED ORDER — QUETIAPINE FUMARATE 100 MG PO TABS: 100.0000 mg | ORAL_TABLET | Freq: Every day | ORAL | Status: DC

## 2014-10-31 MED ORDER — ENOXAPARIN SODIUM 40 MG/0.4ML ~~LOC~~ SOLN
40.0000 mg | SUBCUTANEOUS | Status: DC
  Administered 2014-10-31: 40 mg via SUBCUTANEOUS
  Filled 2014-10-31: qty 0.4

## 2014-10-31 MED ORDER — ONDANSETRON HCL 4 MG/2ML IJ SOLN: 4.0000 mg | Freq: Four times a day (QID) | INTRAMUSCULAR | Status: DC | PRN

## 2014-10-31 MED ORDER — CARVEDILOL 3.125 MG PO TABS
3.1250 mg | ORAL_TABLET | Freq: Two times a day (BID) | ORAL | Status: DC
  Administered 2014-10-31 (×2): 3.125 mg via ORAL
  Filled 2014-10-31 (×2): qty 1

## 2014-10-31 MED ORDER — CYCLOBENZAPRINE HCL 10 MG PO TABS
10.0000 mg | ORAL_TABLET | Freq: Once | ORAL | Status: AC
  Administered 2014-10-31: 10 mg via ORAL
  Filled 2014-10-31: qty 1

## 2014-10-31 MED ORDER — HYDRALAZINE HCL 20 MG/ML IJ SOLN
10.0000 mg | INTRAMUSCULAR | Status: DC | PRN
  Administered 2014-10-31: 02:00:00 via INTRAVENOUS

## 2014-10-31 MED ORDER — AMLODIPINE BESYLATE 5 MG PO TABS: 5.0000 mg | ORAL_TABLET | Freq: Every day | ORAL | Status: AC

## 2014-10-31 MED ORDER — INSULIN ASPART 100 UNIT/ML ~~LOC~~ SOLN
0.0000 [IU] | Freq: Every day | SUBCUTANEOUS | Status: DC
Start: 2014-10-31 — End: 2014-10-31

## 2014-10-31 MED ORDER — ENOXAPARIN SODIUM 40 MG/0.4ML ~~LOC~~ SOLN: 40.0000 mg | SUBCUTANEOUS | Status: DC

## 2014-10-31 MED ORDER — ACETAMINOPHEN 325 MG PO TABS
650.0000 mg | ORAL_TABLET | Freq: Four times a day (QID) | ORAL | Status: DC | PRN
  Administered 2014-10-31: 650 mg via ORAL
  Filled 2014-10-31: qty 2

## 2014-10-31 MED ORDER — ROPINIROLE HCL 0.5 MG PO TABS
0.5000 mg | ORAL_TABLET | Freq: Every day | ORAL | Status: DC
  Filled 2014-10-31: qty 1

## 2014-10-31 MED ORDER — INSULIN ASPART 100 UNIT/ML ~~LOC~~ SOLN
0.0000 [IU] | Freq: Three times a day (TID) | SUBCUTANEOUS | Status: DC
Start: 2014-10-31 — End: 2014-10-31
  Administered 2014-10-31: 1 [IU] via SUBCUTANEOUS
  Filled 2014-10-31: qty 1

## 2014-10-31 MED ORDER — HYDRALAZINE HCL 20 MG/ML IJ SOLN
2.0000 mg | Freq: Once | INTRAMUSCULAR | Status: DC
  Filled 2014-10-31: qty 1

## 2014-10-31 MED ORDER — ASPIRIN EC 81 MG PO TBEC
81.0000 mg | DELAYED_RELEASE_TABLET | Freq: Every day | ORAL | Status: DC
  Administered 2014-10-31: 81 mg via ORAL
  Filled 2014-10-31: qty 1

## 2014-10-31 NOTE — Progress Notes (Signed)
Orthostatics VS taken per [facility] [place] request. MD notified of results per MD, pt ok to be discharged home.

## 2014-10-31 NOTE — Progress Notes (Addendum)
[facility] -  at [facility]   PATIENT NAME: Angel Cruz    MR#:  [id number]  DATE OF BIRTH:  [birth year]  SUBJECTIVE:  CHIEF COMPLAINT:   Chief Complaint  Patient presents with  . Hypertension   the patient presented with intermittent headache and was noted to have elevated blood pressure to 200s over 100. Patient was given 1 dose of clonidine 0.1 mg and his blood pressure improved to 144 today. Admits of some headache today.   Review of Systems  Constitutional: Negative for fever, chills and weight loss.  HENT: Negative for congestion.   Eyes: Negative for blurred vision and double vision.  Respiratory: Negative for cough, sputum production, shortness of breath and wheezing.   Cardiovascular: Negative for chest pain, palpitations, orthopnea, leg swelling and PND.  Gastrointestinal: Negative for nausea, vomiting, abdominal pain, diarrhea, constipation and blood in stool.  Genitourinary: Negative for dysuria, urgency, frequency and hematuria.  Musculoskeletal: Negative for falls.  Neurological: Negative for dizziness, tremors, focal weakness and headaches.  Endo/Heme/Allergies: Does not bruise/bleed easily.  Psychiatric/Behavioral: Negative for depression. The patient does not have insomnia.     VITAL SIGNS: Blood pressure 144/73, pulse 67, temperature 97.6 F (36.4 C), temperature source Oral, resp. rate 18, height 5\' 7"  (1.702 m), weight 100.699 kg (222 lb), SpO2 98 %.  PHYSICAL EXAMINATION:   GENERAL:  59 y.o.-year-old patient lying in the bed with no acute distress.  EYES: Pupils equal, round, reactive to light and accommodation. No scleral icterus. Extraocular muscles intact.  HEENT: Head atraumatic, normocephalic. Oropharynx and nasopharynx clear.  NECK:  Supple, no jugular venous distention. No thyroid enlargement, no tenderness.  LUNGS: Normal breath sounds bilaterally, no wheezing, rales,rhonchi or crepitation. No use of accessory  muscles of respiration.  CARDIOVASCULAR: S1, S2 normal. No murmurs, rubs, or gallops.  ABDOMEN: Soft, nontender, nondistended. Bowel sounds present. No organomegaly or mass.  EXTREMITIES: No pedal edema, cyanosis, or clubbing.  NEUROLOGIC: Cranial nerves II through XII are intact. Muscle strength 5/5 in all extremities. Sensation intact. Gait not checked.  PSYCHIATRIC: The patient is alert and oriented x 3.  SKIN: No obvious rash, lesion, or ulcer.   ORDERS/RESULTS REVIEWED:   CBC  Recent Labs Lab 10/30/14 1940 10/31/14 0524  WBC 6.6 5.7  HGB 11.4* 11.0*  HCT 33.8* 32.7*  PLT 187 163  MCV 88.5 87.9  MCH 29.8 29.5  MCHC 33.7 33.6  RDW 14.1 13.7   ------------------------------------------------------------------------------------------------------------------  Chemistries   Recent Labs Lab 10/30/14 1940 10/31/14 0524  NA 138 140  K 5.2* 4.4  CL 108 110  CO2 22 25  GLUCOSE 163* 129*  BUN 44* 40*  CREATININE 1.29* 1.22  CALCIUM 9.6 9.3   ------------------------------------------------------------------------------------------------------------------ estimated creatinine clearance is 76.4 mL/min (by C-G formula based on Cr of 1.22). ------------------------------------------------------------------------------------------------------------------ No results for input(s): TSH, T4TOTAL, T3FREE, THYROIDAB in the last 72 hours.  Invalid input(s): FREET3  Cardiac Enzymes  Recent Labs Lab 10/30/14 1940 10/31/14 0053  TROPONINI <0.03 <0.03   ------------------------------------------------------------------------------------------------------------------ Invalid input(s): POCBNP ---------------------------------------------------------------------------------------------------------------  RADIOLOGY: Ct Head Wo Contrast  10/31/2014   CLINICAL DATA:  Headache, neck pain, palpitations, recently restarted blood pressure medication, hypertension, diabetes mellitus,  coronary artery disease, ischemic cardiomyopathy, stage III chronic kidney disease  EXAM: CT HEAD WITHOUT CONTRAST  TECHNIQUE: Contiguous axial images were obtained from the base of the skull through the vertex without intravenous contrast.  COMPARISON:  None  FINDINGS: Normal ventricular morphology.  No midline shift or mass effect.  Normal appearance of brain parenchyma.  No intracranial hemorrhage, mass lesion, or acute infarction.  Visualized paranasal sinuses and mastoid air cells clear.  Bones unremarkable.  IMPRESSION: Normal exam.   Electronically Signed   By: Ulyses Southward M.D.   On: 10/31/2014 00:24   Ct Angio Chest Aorta W/cm &/or Wo/cm  10/31/2014   CLINICAL DATA:  Back pain, palpitations, hypertension, recently stopped and restarted blood pressure medication  EXAM: CT ANGIOGRAPHY CHEST WITH CONTRAST  TECHNIQUE: Multidetector CT imaging of the chest was performed using the standard protocol during bolus administration of intravenous contrast. Multiplanar CT image reconstructions and MIPs were obtained to evaluate the vascular anatomy.  CONTRAST:  OMNIPAQUE IOHEXOL 350 MG/ML SOLN IV  COMPARISON:  CT chest 01/03/2012  FINDINGS: Mild scattered atherosclerotic calcifications aorta proximal great vessels and coronary arteries.  No intramural hematoma on precontrast imaging.  Normal aortic enhancement without aneurysm or dissection.  Pulmonary arteries well opacified and patent.  No evidence of pulmonary embolism.  Mildly enlarged peripancreatic lymph node 13 mm short axis image 133.  Remaining visualized portions of upper abdomen normal appearance.  Thoracic adenopathy.  Lungs clear.  No pulmonary infiltrate, pleural effusion or pneumothorax.  No acute osseous findings.  Review of the MIP images confirms the above findings.  IMPRESSION: Mild scattered atherosclerotic calcification.  No evidence of pulmonary embolism.  No acute intrathoracic abnormalities.  Single nonspecific mildly enlarged  peripancreatic lymph node 13 mm short axis.   Electronically Signed   By: Ulyses Southward M.D.   On: 10/31/2014 00:35    EKG:  Orders placed or performed during the hospital encounter of 10/30/14  . ED EKG  . ED EKG  . EKG 12-Lead  . 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  malignant essential hypertension. Initiate patient on Norvasc. Continue patient on low dose of Coreg. Patient is to follow-up with Dr. Kirke Corin for the further recommendations 2. Orthostatic hypotension,  check patient's orthostatic vital signs before he goes 3. Headache, possibly related to elevated blood pressure readings, treat elevated blood pressure 4. Renal insufficiency, improved with therapy   Management plans discussed with the patient, family and they are in agreement.   DRUG ALLERGIES: No Known Allergies  CODE STATUS:     Code Status Orders        Start     Ordered   10/31/14 0204  Full code   Continuous     10/31/14 0203      TOTAL TIME TAKING CARE OF THIS PATIENT: .  Prolonged discussion with patient about treatment plans medications and discharge planning. All questions were answered. He  voiced understanding time spent approximately 15 minutes in discussion  VAICKUTE,RIMA M.D on 10/31/2014 at 12:06 PM  Between 7am to 6pm - Pager - [phone]  After 6pm go to [email or web address] - password [email or web address] [facility]  [facility]  Office  [phone]  CC: Primary care physician; Ruel Favors, MD

## 2014-10-31 NOTE — Progress Notes (Signed)
Pts VSS. Discharge instructions given to pt, pt verbalized understanding. Pt wheeled to car.

## 2014-10-31 NOTE — H&P (Signed)
[facility] - [place] at [facility]   PATIENT NAME: Angel Cruz    MR#:  [id number]  DATE OF BIRTH:  Apr 16, 1955  DATE OF ADMISSION:  10/30/2014  PRIMARY CARE PHYSICIAN: Ruel Favors, MD   REQUESTING/REFERRING PHYSICIAN: Zenda Alpers, M.D.  CHIEF COMPLAINT:   Chief Complaint  Patient presents with  . Hypertension    HISTORY OF PRESENT ILLNESS:  Angel Cruz  is a 59 y.o. male who presents with uncontrolled hypertension. Patient states that he was on a number of antihypertensive medications in the past, but recently was having some orthostatic hypotension. He was taken off all his blood pressure medicines, and then recently restarted on very low-dose Coreg. He states that his blood pressure had been running somewhat high 160s and 170s systolic, but this afternoon shot up to 215/110, and then wasn't coming down so he decided to come in the ED for evaluation. Initial workup in the ED stable. Patient got 1 dose of by mouth clonidine without much effect on his blood pressure. Hospitalists were called for admission for uncontrolled hypertension.  PAST MEDICAL HISTORY:   Past Medical History  Diagnosis Date  . Diabetes mellitus without complication   . Hypertension   . Hyperlipidemia   . Depression   . Rotator cuff tear   . CKD (chronic kidney disease), stage III   . Ischemic cardiomyopathy     a. 10/2013 Echo: EF 35-40%, severe distal anteroseptal, anterior, and apical HK. Diast dysfxn, mild conc LVH, mildly dil LA, mild Ao sclerosis w/o stenosis.  Marland Kitchen CAD (coronary artery disease)     a. 10/2013 Lexi MV: EF 31%, apical, septal, ant-apical, inf-apical, lat-apical scar, septal and apical peri-infarct ischemia;  b. 10/2013 Cath: LM nl, LAD 20ost, 15m (faint R->L collats), D1 80p, LCX min irregs, OM1 min irregs, OM2 nl, OM3 30p, RCA 20p, PDA 60, RPL min irregs-->Med Rx.  . Morbid obesity   . CHF (congestive heart failure)   . OSA (obstructive sleep apnea)   . Sleep  apnea     recent test confirmed diagnosis, was performed at the sleep center across the street  . Hypotension     PAST SURGICAL HISTORY:   Past Surgical History  Procedure Laterality Date  . Cardiac catheterization  10/19/2013    [facility]  . Uvulectomy  1995  . Atrial fibrillation ablation  1995  . Elbow surgery Left   . Rotator cuff repair    . Carpal tunnel release      SOCIAL HISTORY:   Social History  Substance Use Topics  . Smoking status: Never Smoker   . Smokeless tobacco: Never Used  . Alcohol Use: No    FAMILY HISTORY:   Family History  Problem Relation Age of Onset  . Stroke Mother   . COPD Mother   . Heart failure Mother   . Heart attack Mother   . Colon cancer Father   . Diabetes Father   . Diabetes Brother   . Cancer Brother     DRUG ALLERGIES:  No Known Allergies  MEDICATIONS AT HOME:   Prior to Admission medications   Medication Sig Start Date End Date Taking? Authorizing Aliviya Schoeller  aspirin EC 81 MG tablet Take 81 mg by mouth daily.   Yes Historical Clayvon Parlett, MD  carvedilol (COREG) 3.125 MG tablet Take 1 tablet (3.125 mg total) by mouth 2 (two) times daily. 10/23/14  Yes Iran Ouch, MD  CRESTOR 20 MG tablet TAKE 1 TABLET(20 MG) BY MOUTH AT  BEDTIME 10/22/14  Yes Alba Cory, MD  Cyanocobalamin (VITAMIN B-12 PO) Take 1 tablet by mouth daily.   Yes Historical Milas Schappell, MD  escitalopram (LEXAPRO) 10 MG tablet Take 1 tablet (10 mg total) by mouth daily. 10/10/14  Yes Alba Cory, MD  Insulin Degludec (TRESIBA FLEXTOUCH) 200 UNIT/ML SOPN Inject 10 Units into the skin daily. 08/06/14  Yes Alba Cory, MD  QUEtiapine (SEROQUEL) 100 MG tablet Take 1 tablet (100 mg total) by mouth at bedtime. 10/10/14  Yes Alba Cory, MD  rOPINIRole (REQUIP) 0.5 MG tablet Take 1 tablet (0.5 mg total) by mouth at bedtime. 07/30/14  Yes Alba Cory, MD  nitroGLYCERIN (NITROSTAT) 0.4 MG SL tablet Place 0.4 mg under the tongue every 5 (five) minutes as needed for  chest pain.    Historical Levy Wellman, MD    REVIEW OF SYSTEMS:  Review of Systems  Constitutional: Negative for fever, chills, weight loss and malaise/fatigue.  HENT: Negative for ear pain, hearing loss and tinnitus.   Eyes: Negative for blurred vision, double vision, pain and redness.  Respiratory: Negative for cough, hemoptysis and shortness of breath.   Cardiovascular: Negative for chest pain, palpitations, orthopnea and leg swelling.  Gastrointestinal: Negative for nausea, vomiting, abdominal pain, diarrhea and constipation.  Genitourinary: Negative for dysuria, frequency and hematuria.  Musculoskeletal: Negative for back pain, joint pain and neck pain.  Skin:       No acne, rash, or lesions  Neurological: Negative for dizziness, tremors, focal weakness and weakness.  Endo/Heme/Allergies: Negative for polydipsia. Does not bruise/bleed easily.  Psychiatric/Behavioral: Negative for depression. The patient is not nervous/anxious and does not have insomnia.      VITAL SIGNS:   Filed Vitals:   10/30/14 1928 10/30/14 2211 10/30/14 2300 10/31/14 0041  BP: 193/108 233/112 206/104 200/106  Pulse: 72 62 62 66  Temp: 98.1 F (36.7 C)     TempSrc: Oral     Resp: Height:  (1.702 m)     Weight: 100.699 kg (222 lb)     SpO2: 98% 100% 99% 97%   Wt Readings from Last 3 Encounters:  10/30/14 100.699 kg (222 lb)  10/12/14 101.266 kg (223 lb 4 oz)  10/10/14 98.793 kg (217 lb 12.8 oz)    PHYSICAL EXAMINATION:  Physical Exam  Vitals reviewed. Constitutional: He is oriented to person, place, and time. He appears well-developed and well-nourished. No distress.  HENT:  Head: Normocephalic and atraumatic.  Mouth/Throat: Oropharynx is clear and moist.  Eyes: Conjunctivae and EOM are normal. Pupils are equal, round, and reactive to light. No scleral icterus.  Neck: Normal range of motion. Neck supple. No JVD present. No thyromegaly present.  Cardiovascular: Normal rate,  regular rhythm and intact distal pulses.  Exam reveals no gallop and no friction rub.   No murmur heard. Respiratory: Effort normal and breath sounds normal. No respiratory distress. He has no wheezes. He has no rales.  GI: Soft. Bowel sounds are normal. He exhibits no distension. There is no tenderness.  Musculoskeletal: Normal range of motion. He exhibits no edema.  No arthritis, no gout  Lymphadenopathy:    He has no cervical adenopathy.  Neurological: He is alert and oriented to person, place, and time. No cranial nerve deficit.  No dysarthria, no aphasia  Skin: Skin is warm and dry. No rash noted. No erythema.  Psychiatric: He has a normal mood and affect. His behavior is normal. Judgment and thought content normal.    LABORATORY PANEL:  CBC  Recent Labs Lab 10/30/14 1940  WBC 6.6  HGB 11.4*  HCT 33.8*  PLT 187   ------------------------------------------------------------------------------------------------------------------  Chemistries   Recent Labs Lab 10/30/14 1940  NA 138  K 5.2*  CL 108  CO2 22  GLUCOSE 163*  BUN 44*  CREATININE 1.29*  CALCIUM 9.6   ------------------------------------------------------------------------------------------------------------------  Cardiac Enzymes  Recent Labs Lab 10/31/14 0053  TROPONINI <0.03   ------------------------------------------------------------------------------------------------------------------  RADIOLOGY:  Ct Head Wo Contrast  10/31/2014   CLINICAL DATA:  Headache, neck pain, palpitations, recently restarted blood pressure medication, hypertension, diabetes mellitus, coronary artery disease, ischemic cardiomyopathy, stage III chronic kidney disease  EXAM: CT HEAD WITHOUT CONTRAST  TECHNIQUE: Contiguous axial images were obtained from the base of the skull through the vertex without intravenous contrast.  COMPARISON:  None  FINDINGS: Normal ventricular morphology.  No midline shift or mass effect.   Normal appearance of brain parenchyma.  No intracranial hemorrhage, mass lesion, or acute infarction.  Visualized paranasal sinuses and mastoid air cells clear.  Bones unremarkable.  IMPRESSION: Normal exam.   Electronically Signed   By: Ulyses Southward M.D.   On: 10/31/2014 00:24   Ct Angio Chest Aorta W/cm &/or Wo/cm  10/31/2014   CLINICAL DATA:  Back pain, palpitations, hypertension, recently stopped and restarted blood pressure medication  EXAM: CT ANGIOGRAPHY CHEST WITH CONTRAST  TECHNIQUE: Multidetector CT imaging of the chest was performed using the standard protocol during bolus administration of intravenous contrast. Multiplanar CT image reconstructions and MIPs were obtained to evaluate the vascular anatomy.  CONTRAST:  OMNIPAQUE IOHEXOL 350 MG/ML SOLN IV  COMPARISON:  CT chest 01/03/2012  FINDINGS: Mild scattered atherosclerotic calcifications aorta proximal great vessels and coronary arteries.  No intramural hematoma on precontrast imaging.  Normal aortic enhancement without aneurysm or dissection.  Pulmonary arteries well opacified and patent.  No evidence of pulmonary embolism.  Mildly enlarged peripancreatic lymph node 13 mm short axis image 133.  Remaining visualized portions of upper abdomen normal appearance.  Thoracic adenopathy.  Lungs clear.  No pulmonary infiltrate, pleural effusion or pneumothorax.  No acute osseous findings.  Review of the MIP images confirms the above findings.  IMPRESSION: Mild scattered atherosclerotic calcification.  No evidence of pulmonary embolism.  No acute intrathoracic abnormalities.  Single nonspecific mildly enlarged peripancreatic lymph node 13 mm short axis.   Electronically Signed   By: Ulyses Southward M.D.   On: 10/31/2014 00:35    EKG:   Orders placed or performed during the hospital encounter of 10/30/14  . ED EKG  . ED EKG  . EKG 12-Lead  . EKG 12-Lead    IMPRESSION AND PLAN:  Principal Problem:   Uncontrolled hypertension - we will admit  with home antihypertensives on board, as well as IV when necessary antihypertensives to keep his blood pressure less than 160/100. Will not initiate anything like a nicardipine drip at this time as he has not had a good trial of simple when necessary IV antihypertensives. However, this can be initiated if it becomes necessary. Active Problems:   CAD (coronary artery disease) - continue home meds   Uncontrolled diabetes mellitus with chronic kidney disease - heart healthy/carb modified diet with sliding scale insulin and appropriate fingerstick glucose checks before meals at bedtime.   Hyperlipidemia - continue home med   CKD (chronic kidney disease), stage III - creatinine level has actually improved since admission, avoid renal toxins and monitor   RLS (restless legs syndrome) - continue home dose Requip  All the records are reviewed and case discussed with ED Shaydon Lease. Management plans discussed with the patient and/or family.  DVT PROPHYLAXIS: SubQ lovenox  ADMISSION STATUS: Observation  CODE STATUS: Full  TOTAL TIME TAKING CARE OF THIS PATIENT: 40 minutes.    WILLIS, DAVID FIELDING 10/31/2014, 1:25 AM  [facility]  Office  [phone]  CC: Primary care physician; Ruel Favors, MD

## 2014-10-31 NOTE — Care Management Note (Signed)
Case Management Note  Patient Details  Name: Jishnu Jenniges MRN: [id number] Date of Birth: 09/20/1955  Subjective/Objective:                  Met with patient to discuss discharge planning. He is from home with wife and son. He states he was sent to [facility] by [facility] due to HTN. His PCP is with [facility] [phone]- he has an appointment on 10/3/at 0930. He is open to [facility] for Rx management. He uses Oncologist in [place] for Rx. He states he is independent with mobility. He states he can afford/obtain his Rx.   Action/Plan: Corene Cornea with [facility] notified of patient admission. Confirmed appointment with PCP. RNCM will continue to follow.   Expected Discharge Date:                  Expected Discharge Plan:     In-House Referral:     Discharge planning Services  CM Consult  Post Acute Care Choice:  Home Health Choice offered to:  Patient  DME Arranged:    DME Agency:  [place]:  RN [facility] Agency:  [place]  Status of Service:  In process, will continue to follow  Medicare Important Message Given:    Date Medicare IM Given:    Medicare IM give by:    Date Additional Medicare IM Given:    Additional Medicare Important Message give by:     If discussed at [place] of Stay Meetings, dates discussed:    Additional Comments:  Marshell Garfinkel, RN 10/31/2014, 10:45 AM

## 2014-10-31 NOTE — Progress Notes (Signed)
Pts home insulin handed back to pt at the bedside.

## 2014-10-31 NOTE — Discharge Summary (Signed)
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 Diabetes mellitus without complication   . Hypertension   . Hyperlipidemia   . Depression   . Rotator cuff tear   . CKD (chronic kidney disease), stage III   . Ischemic cardiomyopathy     a. 10/2013 Echo: EF 35-40%, severe distal anteroseptal, anterior, and apical HK. Diast dysfxn, mild conc LVH, mildly dil LA, mild Ao sclerosis w/o stenosis.  Marland Kitchen CAD (coronary artery disease)     a. 10/2013 Lexi MV: EF 31%, apical, septal, ant-apical, inf-apical, lat-apical scar, septal and apical peri-infarct ischemia;  b. 10/2013 Cath: LM nl, LAD 20ost, 178m (faint R->L collats), D1 80p, LCX min irregs, OM1 min irregs, OM2 nl, OM3 30p, RCA 20p, PDA 60, RPL min irregs-->Med Rx.  . Morbid obesity   . CHF (congestive heart failure)   . OSA (obstructive sleep apnea)   . Sleep apnea     recent test confirmed diagnosis, was performed at the sleep center across the street  . Hypotension     .pro HOSPITAL COURSE:  Patient is 60 year old male who presents with intermittent headache and was noted to have elevated blood pressure 200/100. He was given clonidine and his blood pressure improved. His blood pressure medications. However, he was noted to  have orthostatic hypotension, which was a problem in the past.  Discussion by problem 1. malignant essential hypertension. The cause of orthostatic hypotension and concerns about patient's heart's inability to pick up rate , we are going to discontinue Coreg , initiate patient on Norvasc, following patient's blood pressure readings as well as his orthostatic vital signs closely. Patient is to follow-up with Dr. Kirke Corin for the further recommendations 2. Orthostatic hypotension, patient's orthostatic vital signs were checked and were found to be abnormal, discontinue beta blocker at this time 3. Headache, possibly related to elevated blood pressure readings, treat elevated blood pressure, follow-up with primary care physician 4. Renal insufficiency, improved with therapy  DISCHARGE CONDITIONS:   Fair  CONSULTS OBTAINED:     DRUG ALLERGIES:  No Known Allergies  DISCHARGE MEDICATIONS:   Discharge Medication List as of 10/31/2014 12:41 PM    START taking these medications   Details  amLODipine (NORVASC) 5 MG tablet Take 1 tablet (5 mg total) by mouth daily., Starting 10/31/2014, Until Discontinued, Normal      CONTINUE these medications which have NOT CHANGED   Details  aspirin EC 81 MG tablet Take 81 mg by mouth daily., Until Discontinued, Historical Med    CRESTOR 20 MG tablet TAKE 1 TABLET(20 MG) BY MOUTH AT BEDTIME, Normal    Cyanocobalamin (VITAMIN B-12 PO) Take 1 tablet by mouth daily., Until Discontinued, Historical Med    escitalopram (LEXAPRO) 10 MG tablet Take 1 tablet (10 mg total) by mouth  daily., Starting 10/10/2014, Until Discontinued, Normal    Insulin Degludec (TRESIBA FLEXTOUCH) 200 UNIT/ML SOPN Inject 10 Units into the skin daily., Starting 08/06/2014, Until Discontinued, Normal    QUEtiapine (SEROQUEL) 100 MG tablet Take 1 tablet (100 mg total) by mouth at bedtime., Starting 10/10/2014, Until Discontinued, Normal    rOPINIRole (REQUIP) 0.5 MG tablet Take 1 tablet  (0.5 mg total) by mouth at bedtime., Starting 07/30/2014, Until Discontinued, Normal    nitroGLYCERIN (NITROSTAT) 0.4 MG SL tablet Place 0.4 mg under the tongue every 5 (five) minutes as needed for chest pain., Until Discontinued, Historical Med      STOP taking these medications     carvedilol (COREG) 3.125 MG tablet          DISCHARGE INSTRUCTIONS:    Patient is to follow-up with primary care physician, Dr. Carlynn Purl, cardiologist, Dr. Kirke Corin in 1 week after discharge  If you experience worsening of your admission symptoms, develop shortness of breath, life threatening emergency, suicidal or homicidal thoughts you must seek medical attention immediately by calling 911 or calling your MD immediately  if symptoms less severe.  You Must read complete instructions/literature along with all the possible adverse reactions/side effects for all the Medicines you take and that have been prescribed to you. Take any new Medicines after you have completely understood and accept all the possible adverse reactions/side effects.   Please note  You were cared for by a hospitalist during your hospital stay. If you have any questions about your discharge medications or the care you received while you were in the hospital after you are discharged, you can call the unit and asked to speak with the hospitalist on call if the hospitalist that took care of you is not available. Once you are discharged, your primary care physician will handle any further medical issues. Please note that NO REFILLS for any discharge medications will be authorized once you are discharged, as it is imperative that you return to your primary care physician (or establish a relationship with a primary care physician if you do not have one) for your aftercare needs so that they can reassess your need for medications and monitor your lab values.    Today   CHIEF COMPLAINT:   Chief Complaint  Patient presents with  . Hypertension     HISTORY OF PRESENT ILLNESS:  Angel Cruz  is a 59 y.o. male with a known history of hypertension, diabetes, hyperlipidemia with recent problems with blood pressure including orthostatic hypotension who presents to the hospital with headache and was noted to have elevated blood pressure. He was initiated on blood pressure medications and unfortunately still had orthostatic hypotension, prompting discontinuation of beta blockers Discussion by problem 1. malignant essential hypertension. The cause of orthostatic hypotension and concerns about patient's heart's inability to pick up rate , we are going to discontinue Coreg , initiate patient on Norvasc, following patient's blood pressure readings as well as his orthostatic vital signs closely. Patient is to follow-up with Dr. Kirke Corin for the further recommendations 2. Orthostatic hypotension, patient's orthostatic vital signs were checked and were found to be abnormal, discontinue beta blocker at this time 3. Headache, possibly related to elevated blood pressure readings, treat elevated blood pressure, follow-up with primary care physician 4. Renal insufficiency, improved with therapy  VITAL SIGNS:  Blood pressure 130/74, pulse 75, temperature 97.8 F (36.6 C), temperature source Oral, resp. rate 18, height  (1.702 m), weight 100.699 kg (222 lb), SpO2 99 %.  I/O:  Intake/Output Summary (Last 24 hours) at 10/31/14 1343 Last data filed at 10/31/14 0800  Gross per 24 hour  Intake    480 ml  Output      0 ml  Net    480 ml    PHYSICAL EXAMINATION:  GENERAL:  59 y.o.-year-old patient lying in the bed with no acute distress.  EYES: Pupils equal, round, reactive to light and accommodation. No scleral icterus. Extraocular muscles intact.  HEENT: Head atraumatic, normocephalic. Oropharynx and nasopharynx clear.  NECK:  Supple, no jugular venous distention. No thyroid enlargement, no tenderness.  LUNGS: Normal breath sounds bilaterally, no  wheezing, rales,rhonchi or crepitation. No use of accessory muscles of respiration.  CARDIOVASCULAR: S1, S2 normal. No murmurs, rubs, or gallops.  ABDOMEN: Soft, non-tender, non-distended. Bowel sounds present. No organomegaly or mass.  EXTREMITIES: No pedal edema, cyanosis, or clubbing.  NEUROLOGIC: Cranial nerves II through XII are intact. Muscle strength 5/5 in all extremities. Sensation intact. Gait not checked.  PSYCHIATRIC: The patient is alert and oriented x 3.  SKIN: No obvious rash, lesion, or ulcer.   DATA REVIEW:   CBC  Recent Labs Lab 10/31/14 0524  WBC 5.7  HGB 11.0*  HCT 32.7*  PLT 163    Chemistries   Recent Labs Lab 10/31/14 0524  NA 140  K 4.4  CL 110  CO2 25  GLUCOSE 129*  BUN 40*  CREATININE 1.22  CALCIUM 9.3    Cardiac Enzymes  Recent Labs Lab 10/31/14 0053  TROPONINI <0.03    Microbiology Results  Results for orders placed or performed in visit on 10/18/13  Culture, blood (single)     Status: None   Collection Time: 10/17/13 12:07 PM  Result Value Ref Range Status   Micro Text Report   Final       COMMENT                   NO GROWTH AEROBICALLY/ANAEROBICALLY IN 5 DAYS   ANTIBIOTIC                                                      Culture, blood (single)     Status: None   Collection Time: 10/17/13 12:07 PM  Result Value Ref Range Status   Micro Text Report   Final       COMMENT                   NO GROWTH AEROBICALLY/ANAEROBICALLY IN 5 DAYS   ANTIBIOTIC                                                        RADIOLOGY:  Ct Head Wo Contrast  10/31/2014   CLINICAL DATA:  Headache, neck pain, palpitations, recently restarted blood pressure medication, hypertension, diabetes mellitus, coronary artery disease, ischemic cardiomyopathy, stage III chronic kidney disease  EXAM: CT HEAD WITHOUT CONTRAST  TECHNIQUE: Contiguous axial images were obtained from the base of the skull through the vertex without intravenous contrast.   COMPARISON:  None  FINDINGS: Normal ventricular morphology.  No midline shift or mass effect.  Normal appearance of brain parenchyma.  No intracranial hemorrhage,  mass lesion, or acute infarction.  Visualized paranasal sinuses and mastoid air cells clear.  Bones unremarkable.  IMPRESSION: Normal exam.   Electronically Signed   By: Ulyses Southward M.D.   On: 10/31/2014 00:24   Ct Angio Chest Aorta W/cm &/or Wo/cm  10/31/2014   CLINICAL DATA:  Back pain, palpitations, hypertension, recently stopped and restarted blood pressure medication  EXAM: CT ANGIOGRAPHY CHEST WITH CONTRAST  TECHNIQUE: Multidetector CT imaging of the chest was performed using the standard protocol during bolus administration of intravenous contrast. Multiplanar CT image reconstructions and MIPs were obtained to evaluate the vascular anatomy.  CONTRAST:  OMNIPAQUE IOHEXOL 350 MG/ML SOLN IV  COMPARISON:  CT chest 01/03/2012  FINDINGS: Mild scattered atherosclerotic calcifications aorta proximal great vessels and coronary arteries.  No intramural hematoma on precontrast imaging.  Normal aortic enhancement without aneurysm or dissection.  Pulmonary arteries well opacified and patent.  No evidence of pulmonary embolism.  Mildly enlarged peripancreatic lymph node 13 mm short axis image 133.  Remaining visualized portions of upper abdomen normal appearance.  Thoracic adenopathy.  Lungs clear.  No pulmonary infiltrate, pleural effusion or pneumothorax.  No acute osseous findings.  Review of the MIP images confirms the above findings.  IMPRESSION: Mild scattered atherosclerotic calcification.  No evidence of pulmonary embolism.  No acute intrathoracic abnormalities.  Single nonspecific mildly enlarged peripancreatic lymph node 13 mm short axis.   Electronically Signed   By: Ulyses Southward M.D.   On: 10/31/2014 00:35    EKG:   Orders placed or performed during the hospital encounter of 10/30/14  . ED EKG  . ED EKG  . EKG 12-Lead  . EKG 12-Lead       Management plans discussed with the patient, family and they are in agreement.  CODE STATUS:     Code Status Orders        Start     Ordered   10/31/14 0204  Full code   Continuous     10/31/14 0203      TOTAL TIME TAKING CARE OF THIS PATIENT: 45 minutes.    Katharina Caper M.D on 10/31/2014 at 1:43 PM  Between 7am to 6pm - Pager - [phone]  After 6pm go to [email or web address] - password [email or web address] [facility]  [place] [facility]  Office  [phone]  CC: Primary care physician; Ruel Favors, MD

## 2014-10-31 NOTE — Telephone Encounter (Signed)
Pt discharged from [facility] today.

## 2014-10-31 NOTE — Care Management (Signed)
Patient discharging home today with follow up appointment with PCP on Oct. 3. I have notified [facility] with [facility] of patient discharge. No further RNCM needs. Case closed.

## 2014-10-31 NOTE — Telephone Encounter (Signed)
Pt c/o BP issue: STAT if pt c/o blurred vision, one-sided weakness or slurred speech  1. What are your last 5 BP readings? 215/118 hr 72  2. Are you having any other symptoms (ex. Dizziness, headache, blurred vision, passed out)? Went to [facility] er then admitted  233/112  3. What is your BP issue?    Please call patient.

## 2014-10-31 NOTE — ED Provider Notes (Signed)
[facility] Emergency Department Provider Note  ____________________________________________  Time seen: Approximately 2310 AM  I have reviewed the triage vital signs and the nursing notes.   HISTORY  Chief Complaint Hypertension    HPI Markon Jares is a 59 y.o. male who has been monitoring his blood pressure due to kidney failure. The patient has been seeing the kidney doctor Dr. Wynelle Link. The patient reports he has been monitoring his blood pressure since August 20. This is the third time he's been here in the last 7 weeks. The patient was admitted 2 times previously for low blood pressure and kidney failure. He reports that he saw his cardiologist Dr.  Sink 2 weeks ago and they did not want him started on any other blood pressure medicines. The patient has been taken off of his medication but last week after having some high blood pressures in the 190s over 100s he was restarted on his carvedilol 3.125 mg twice a day. He was told that they would prefer his blood pressure little bit high but today the patient's blood pressures of been in the 200s over 100s. The patient is also had some intermittent head pain and neck pain chest pain and some pain in his back between his shoulder blades today. The patient also had some sensation of rapid heart rate today. The patient reports that the pain in his back as a 10 out of 10 in intensity. They called the patient's nurse who helps monitor his blood pressure and was told to come into the hospital for further evaluation. The patient denies any current headache and he also denies any current chest pain.    Past Medical History  Diagnosis Date  . Diabetes mellitus without complication   . Hypertension   . Hyperlipidemia   . Depression   . Rotator cuff tear   . CKD (chronic kidney disease), stage III   . Ischemic cardiomyopathy     a. 10/2013 Echo: EF 35-40%, severe distal anteroseptal, anterior, and apical HK. Diast dysfxn, mild  conc LVH, mildly dil LA, mild Ao sclerosis w/o stenosis.  Marland Kitchen CAD (coronary artery disease)     a. 10/2013 Lexi MV: EF 31%, apical, septal, ant-apical, inf-apical, lat-apical scar, septal and apical peri-infarct ischemia;  b. 10/2013 Cath: LM nl, LAD 20ost, 185m (faint R->L collats), D1 80p, LCX min irregs, OM1 min irregs, OM2 nl, OM3 30p, RCA 20p, PDA 60, RPL min irregs-->Med Rx.  . Morbid obesity   . CHF (congestive heart failure)   . OSA (obstructive sleep apnea)   . Sleep apnea     recent test confirmed diagnosis, was performed at the sleep center across the street  . Hypotension     Patient Active Problem List   Diagnosis Date Noted  . Uncontrolled hypertension 10/31/2014  . OSA (obstructive sleep apnea) 09/28/2014  . Anemia 09/14/2014  . Cardiomyopathy, ischemic   . Orthostatic hypotension 09/07/2014  . Angioedema 07/30/2014  . RLS (restless legs syndrome) 07/30/2014  . B12 deficiency 07/30/2014  . Vitamin D deficiency 07/30/2014  . Chronic systolic heart failure 11/14/2013  . Ischemic cardiomyopathy   . CAD (coronary artery disease)   . Hypertension   . Hyperlipidemia   . 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 Depression   . CKD (chronic kidney disease), stage III     Past Surgical History  Procedure Laterality Date  . Cardiac catheterization  10/19/2013    [facility]  . Uvulectomy  1995  . Atrial fibrillation  ablation  1995  . Elbow surgery Left   . Rotator cuff repair    . Carpal tunnel release      Current Outpatient Rx  Name  Route  Sig  Dispense  Refill  . aspirin EC 81 MG tablet   Oral   Take 81 mg by mouth daily.         . carvedilol (COREG) 3.125 MG tablet   Oral   Take 1 tablet (3.125 mg total) by mouth 2 (two) times daily.   60 tablet   0   . CRESTOR 20 MG tablet      TAKE 1 TABLET(20 MG) BY MOUTH AT BEDTIME   30 tablet   5   . Cyanocobalamin (VITAMIN B-12 PO)   Oral   Take 1 tablet by mouth daily.         Marland Kitchen  escitalopram (LEXAPRO) 10 MG tablet   Oral   Take 1 tablet (10 mg total) by mouth daily.   90 tablet   1   . Insulin Degludec (TRESIBA FLEXTOUCH) 200 UNIT/ML SOPN   Subcutaneous   Inject 10 Units into the skin daily.   5 pen   2   . QUEtiapine (SEROQUEL) 100 MG tablet   Oral   Take 1 tablet (100 mg total) by mouth at bedtime.   90 tablet   0   . rOPINIRole (REQUIP) 0.5 MG tablet   Oral   Take 1 tablet (0.5 mg total) by mouth at bedtime.   30 tablet   5   . nitroGLYCERIN (NITROSTAT) 0.4 MG SL tablet   Sublingual   Place 0.4 mg under the tongue every 5 (five) minutes as needed for chest pain.           Allergies Review of patient's allergies indicates no known allergies.  Family History  Problem Relation Age of Onset  . Stroke Mother   . COPD Mother   . Heart failure Mother   . Heart attack Mother   . Colon cancer Father   . Diabetes Father   . Diabetes Brother   . Cancer Brother     Social History Social History  Substance Use Topics  . Smoking status: Never Smoker   . Smokeless tobacco: Never Used  . Alcohol Use: No    Review of Systems Constitutional: No fever/chills Eyes: No visual changes. ENT: No sore throat. Cardiovascular:  chest pain. Respiratory: Denies shortness of breath. Gastrointestinal: No abdominal pain.  No nausea, no vomiting.  No diarrhea.  No constipation. Genitourinary: Negative for dysuria. Musculoskeletal:  for back pain. Skin: Negative for rash. Neurological: Headache. 10-point ROS otherwise negative.  ____________________________________________   PHYSICAL EXAM:  VITAL SIGNS: ED Triage Vitals  Enc Vitals Group     BP 10/30/14 1928 193/108 mmHg     Pulse Rate 10/30/14 1928 72     Resp 10/30/14 1928 18     Temp 10/30/14 1928 98.1 F (36.7 C)     Temp Source 10/30/14 1928 Oral     SpO2 10/30/14 1928 98 %     Weight 10/30/14 1928 222 lb (100.699 kg)     Height 10/30/14 1928  (1.702 m)     Head Cir --       Peak Flow --      Pain Score 10/30/14 1928 9     Pain Loc --      Pain Edu? --      Excl. in GC? --  Constitutional: Alert and oriented. Well appearing and in moderate distress. Eyes: Conjunctivae are normal. PERRL. EOMI. Head: Atraumatic. Nose: No congestion/rhinnorhea. Mouth/Throat: Mucous membranes are moist.  Oropharynx non-erythematous. Neck: No cervical spine tenderness to palpation. Cardiovascular: Normal rate, regular rhythm. Grossly normal heart sounds.  Good peripheral circulation. Respiratory: Normal respiratory effort.  No retractions. Lungs CTAB. Gastrointestinal: Soft and nontender. No distention. Positive bowel sounds Musculoskeletal: No lower extremity tenderness nor edema.  Neurologic:  Normal speech and language.  Skin:  Skin is warm, dry and intact.  Psychiatric: Mood and affect are normal.   ____________________________________________   LABS (all labs ordered are listed, but only abnormal results are displayed)  Labs Reviewed  CBC - Abnormal; Notable for the following:    RBC 3.82 (*)    Hemoglobin 11.4 (*)    HCT 33.8 (*)    All other components within normal limits  BASIC METABOLIC PANEL - Abnormal; Notable for the following:    Potassium 5.2 (*)    Glucose, Bld 163 (*)    BUN 44 (*)    Creatinine, Ser 1.29 (*)    All other components within normal limits  TROPONIN I  TROPONIN I   ____________________________________________  EKG  ED ECG REPORT I, Rebecka Apley, the attending physician, personally viewed and interpreted this ECG.   Date: 10/30/2014  EKG Time: 1932  Rate: 69  Rhythm: normal sinus rhythm  Axis: normal  Intervals:none  ST&T Change: none  ED ECG REPORT #2 I, Rebecka Apley, the attending physician, personally viewed and interpreted this ECG.   Date: 10/30/2014  EKG Time: 2226  Rate: 62  Rhythm: normal sinus rhythm  Axis: normal  Intervals:none  ST&T Change:  none   ____________________________________________  RADIOLOGY  CT head: Normal exam CT angiogram chest: Mild scattered atherosclerotic calcification, no evidence of pulmonary embolism, no acute intrathoracic abnormalities, single nonspecific mildly enlarged. Pancreatic lymph node 13 mm short axis. ____________________________________________   PROCEDURES  Procedure(s) performed: None  Critical Care performed: No  ____________________________________________   INITIAL IMPRESSION / ASSESSMENT AND PLAN / ED COURSE  Pertinent labs & imaging results that were available during my care of the patient were reviewed by me and considered in my medical decision making (see chart for details).  This is a 59 year old male who comes in today with elevated blood pressures back and chest pain. I did a CT scan looking for a possible aortic dissection which was negative. I did give the patient a dose of clonidine orally as I am concerned about the patient's history of hypotension. I contacted Dr. Shirlee Latch who was on-call for cardiology to determine if there was a specific medication that we could give for the patient's blood pressure. He recommended admitting the patient to control his pressures in the hospital instead of sending him home given his history as well as his current symptoms. I ordered another dose of hydralazine 2 mg IV 1 and will admit the patient to the hospitalist service. I discussed this with the patient. The patient was also given flexeril for his back pain. ____________________________________________   FINAL CLINICAL IMPRESSION(S) / ED DIAGNOSES  Final diagnoses:  Back pain  Hypertension  Hypertensive urgency      Rebecka Apley, MD 10/31/14 0120

## 2014-11-05 ENCOUNTER — Telehealth: Payer: Self-pay

## 2014-11-05 NOTE — Telephone Encounter (Signed)
S/w pt wife, Angelique Blonder, who states coreg discontinued at 9/20 hospitalization and amlodipine 5mg  qd added. Wife reports BP readings "ok" for a day or two after discharge however, BP elevated again.  Reports weekend readings of 159/93 , 155/91, 152/86 157/99 Today's reading 177/110 at 8am took amlodipine. Recheck reading: 180/106 at 11:30. Advised wife to continue monitoring BP and will forward to MD to advise.

## 2014-11-05 NOTE — Telephone Encounter (Signed)
Pt is calling back stating he did not receive a call back from last week. States he is calling regarding his BP being elevated. States he was in [facility] Thursday morning 9/22. Please call.

## 2014-11-05 NOTE — Telephone Encounter (Signed)
Resume Coreg 3.125 mg bid. Continue Amlodipine.

## 2014-11-05 NOTE — Telephone Encounter (Signed)
S/w pt wife Angelique Blonder of recommendations. Informed pt to continue monitoring BP before and 1 hour after meds. Wife verbalized understanding and states continued BP checks.

## 2014-11-08 ENCOUNTER — Telehealth: Payer: Self-pay | Admitting: Family Medicine

## 2014-11-08 NOTE — Telephone Encounter (Signed)
Has having issue with his BP and wants to know if they can extend their order with him for more weeks. He is on depression medication and is having it seems more depression than normal but has appt on Monday with the dr.

## 2014-11-08 NOTE — Telephone Encounter (Signed)
The home health nurse is the one that called and her name is in the top for contact.

## 2014-11-08 NOTE — Telephone Encounter (Signed)
Who can extend what?

## 2014-11-09 NOTE — Telephone Encounter (Signed)
Verbal order extended for 1 more week to monitor blood pressure

## 2014-11-12 ENCOUNTER — Encounter: Payer: Self-pay | Admitting: Family Medicine

## 2014-11-12 ENCOUNTER — Ambulatory Visit (INDEPENDENT_AMBULATORY_CARE_PROVIDER_SITE_OTHER): Payer: Medicaid Other | Admitting: Family Medicine

## 2014-11-12 VITALS — BP 128/80 | HR 73 | Temp 97.7°F | Resp 16 | Ht 67.0 in | Wt 229.4 lb

## 2014-11-12 DIAGNOSIS — E1122 Type 2 diabetes mellitus with diabetic chronic kidney disease: Secondary | ICD-10-CM

## 2014-11-12 DIAGNOSIS — G4700 Insomnia, unspecified: Secondary | ICD-10-CM | POA: Diagnosis not present

## 2014-11-12 DIAGNOSIS — N183 Chronic kidney disease, stage 3 (moderate): Secondary | ICD-10-CM

## 2014-11-12 DIAGNOSIS — Z794 Long term (current) use of insulin: Secondary | ICD-10-CM | POA: Diagnosis not present

## 2014-11-12 DIAGNOSIS — E1165 Type 2 diabetes mellitus with hyperglycemia: Secondary | ICD-10-CM | POA: Diagnosis not present

## 2014-11-12 DIAGNOSIS — I10 Essential (primary) hypertension: Secondary | ICD-10-CM | POA: Diagnosis not present

## 2014-11-12 DIAGNOSIS — IMO0002 Reserved for concepts with insufficient information to code with codable children: Secondary | ICD-10-CM

## 2014-11-12 DIAGNOSIS — F330 Major depressive disorder, recurrent, mild: Secondary | ICD-10-CM | POA: Diagnosis not present

## 2014-11-12 MED ORDER — INSULIN DEGLUDEC 200 UNIT/ML ~~LOC~~ SOPN: 8.0000 [IU] | PEN_INJECTOR | Freq: Every day | SUBCUTANEOUS | Status: DC

## 2014-11-12 NOTE — Progress Notes (Signed)
Name: Angel Cruz   MRN: [id number]    DOB: Jun 10, 1955   Date:11/12/2014       Progress Note  Subjective  Chief Complaint  Chief Complaint  Patient presents with  . Medication Refill    follow-up  . Diabetes    checking BG levels 2x a day.  Pt wife states he has been having low readings low-38, high-145.  Wants to see about dropping units of tresiba since getting low readings  . Hypertension    was in the hospital for hypertension being elevated he is currently on coreg and amlodopine  . Depression    pt has been more depressed lately and wants to see about uping the dose  . Insomnia    wants to see about lowering dose of serequel or changing to something else it makes him in a drunk like state    HPI  DMII: he is taking [place] 10 units daily and glucose is dropping- around 60's in am's. He will decrease dose of Tresiba to 8 units daily and titrate down to fasting goal around 80.  He denies polyuria or polydipsia, but has polyphagia. He has been taking aspirin, not on ACE/ARB. He is also trying to eat protein snack before bed time.   HTN: his bp has been going from high to low, recently went back to [facility] on 10/30/2014. BP was 233/118 at home, he was started on Norvasc during hospital stay, and since discharged, spoke to cardiologist ( Dr. Fletcher Anon ) and is back on Coreg 3.215 mg twice weekly. BP since discharge still elevated, he has follow up with Nephrologist every 3 months and a nurse visit for bp check next week.   Insomnia: He is on Seroquel, taking medication at 9 pm and goes to bed at 10 pm.  Wife is worried because he seems to have a slow speech after he takes medication. Advised to take it in bed from now on.   Major depression: he is feeling better, but about every other week he feels very down, and wants to go to his room and not do anything. He denies any crying spells. He feels like the medication is helping him.   Patient Active Problem List   Diagnosis Date Noted  .  Insomnia 11/12/2014  . Uncontrolled hypertension 10/31/2014  . Essential hypertension, malignant 10/31/2014  . OSA (obstructive sleep apnea) 09/28/2014  . Anemia 09/14/2014  . Cardiomyopathy, ischemic   . Orthostatic hypotension 09/07/2014  . Angioedema 07/30/2014  . RLS (restless legs syndrome) 07/30/2014  . B12 deficiency 07/30/2014  . Vitamin D deficiency 07/30/2014  . Chronic systolic heart failure ([place]) 11/14/2013  . Ischemic cardiomyopathy   . CAD (coronary artery disease)   . Hyperlipidemia   . Uncontrolled diabetes mellitus with stage 2 chronic kidney disease ([place])   . Major depression, recurrent ([place])   . CKD (chronic kidney disease), stage III     Past Surgical History  Procedure Laterality Date  . Cardiac catheterization  10/19/2013    [facility]  . Uvulectomy  1995  . Atrial fibrillation ablation  1995  . Elbow surgery Left   . Rotator cuff repair    . Carpal tunnel release      Family History  Problem Relation Age of Onset  . Stroke Mother   . COPD Mother   . Heart failure Mother   . Heart attack Mother   . Colon cancer Father   . Diabetes Father   . Diabetes Brother   .  Cancer Brother     Social History   Social History  . Marital Status: Married    Spouse Name: [name]  . Number of Children: 2  . Years of Education: N/A   Occupational History  . Not on file.   Social History Main Topics  . Smoking status: Never Smoker   . Smokeless tobacco: Never Used  . Alcohol Use: No  . Drug Use: No  . Sexual Activity: No   Other Topics Concern  . Not on file   Social History Narrative     Current outpatient prescriptions:  .  carvedilol (COREG) 3.125 MG tablet, Take 3.125 mg by mouth 2 (two) times daily with a meal., Disp: , Rfl:  .  amLODipine (NORVASC) 5 MG tablet, Take 1 tablet (5 mg total) by mouth daily., Disp: 30 tablet, Rfl: 6 .  aspirin EC 81 MG tablet, Take 81 mg by mouth daily., Disp: , Rfl:  .  CRESTOR 20 MG tablet, TAKE 1 TABLET(20 MG) BY  MOUTH AT BEDTIME, Disp: 30 tablet, Rfl: 5 .  Cyanocobalamin (VITAMIN B-12 PO), Take 1 tablet by mouth daily., Disp: , Rfl:  .  escitalopram (LEXAPRO) 10 MG tablet, Take 1 tablet (10 mg total) by mouth daily., Disp: 90 tablet, Rfl: 1 .  Insulin Degludec (TRESIBA FLEXTOUCH) 200 UNIT/ML SOPN, Inject 10 Units into the skin daily., Disp: 5 pen, Rfl: 2 .  nitroGLYCERIN (NITROSTAT) 0.4 MG SL tablet, Place 0.4 mg under the tongue every 5 (five) minutes as needed for chest pain., Disp: , Rfl:  .  QUEtiapine (SEROQUEL) 100 MG tablet, Take 1 tablet (100 mg total) by mouth at bedtime., Disp: 90 tablet, Rfl: 0 .  rOPINIRole (REQUIP) 0.5 MG tablet, Take 1 tablet (0.5 mg total) by mouth at bedtime., Disp: 30 tablet, Rfl: 5  No Known Allergies   ROS  Constitutional: Negative for fever or weight change.  Respiratory: Negative for cough and shortness of breath.   Cardiovascular: Negative for chest pain or palpitations.  Gastrointestinal: Negative for abdominal pain, no bowel changes.  Musculoskeletal: Negative for gait problem or joint swelling.  Skin: Negative for rash.  Neurological: Negative for dizziness or headache.  No other specific complaints in a complete review of systems (except as listed in HPI above).  Objective  Filed Vitals:   11/12/14 0936  BP: 128/80  Pulse: 73  Temp: 97.7 F (36.5 C)  TempSrc: Oral  Resp: 16  Height: $Remove'5\' 7"'[id number]$  (1.702 m)  Weight: 229 lb 6.4 oz (104.055 kg)  SpO2: 93%    Body mass index is 35.92 kg/(m^2).  Physical Exam  Constitutional: Patient appears well-developed and well-nourished. Obese No distress.  HEENT: head atraumatic, normocephalic, pupils equal and reactive to light, neck supple, throat within normal limits Cardiovascular: Normal rate, regular rhythm and normal heart sounds.  No murmur heard. No BLE edema. Pulmonary/Chest: Effort normal and breath sounds normal. No respiratory distress. Abdominal: Soft.  There is no tenderness. Psychiatric:  Patient has a normal mood and affect. behavior is normal. Judgment and thought content normal.  Recent Results (from the past 2160 hour(s))  Comprehensive metabolic panel     Status: Abnormal   Collection Time: 08/15/14  7:51 PM  Result Value Ref Range   Sodium 136 135 - 145 mmol/L   Potassium 4.9 3.5 - 5.1 mmol/L   Chloride 107 101 - 111 mmol/L   CO2 23 22 - 32 mmol/L   Glucose, Bld 159 (H) 65 - 99 mg/dL   BUN  40 (H) 6 - 20 mg/dL   Creatinine, Ser 1.39 (H) 0.61 - 1.24 mg/dL   Calcium 9.0 8.9 - 10.3 mg/dL   Total Protein 7.0 6.5 - 8.1 g/dL   Albumin 4.0 3.5 - 5.0 g/dL   AST 18 15 - 41 U/L   ALT 14 (L) 17 - 63 U/L   Alkaline Phosphatase 51 38 - 126 U/L   Total Bilirubin 0.3 0.3 - 1.2 mg/dL   GFR calc non Af Amer 55 (L) >60 mL/min   GFR calc Af Amer >60 >60 mL/min    Comment: (NOTE) The eGFR has been calculated using the CKD EPI equation. This calculation has not been validated in all clinical situations. eGFR's persistently <60 mL/min signify possible Chronic Kidney Disease.    Anion gap 6 5 - 15  CBC with Differential     Status: Abnormal   Collection Time: 08/15/14  7:51 PM  Result Value Ref Range   WBC 5.5 3.8 - 10.6 K/uL   RBC 4.16 (L) 4.40 - 5.90 MIL/uL   Hemoglobin 12.2 (L) 13.0 - 18.0 g/dL   HCT 36.6 (L) 40.0 - 52.0 %   MCV 88.1 80.0 - 100.0 fL   MCH 29.3 26.0 - 34.0 pg   MCHC 33.3 32.0 - 36.0 g/dL   RDW 14.4 11.5 - 14.5 %   Platelets 170 150 - 440 K/uL   Neutrophils Relative % 60 %   Neutro Abs 3.3 1.4 - 6.5 K/uL   Lymphocytes Relative 30 %   Lymphs Abs 1.6 1.0 - 3.6 K/uL   Monocytes Relative 5 %   Monocytes Absolute 0.3 0.2 - 1.0 K/uL   Eosinophils Relative 4 %   Eosinophils Absolute 0.2 0 - 0.7 K/uL   Basophils Relative 1 %   Basophils Absolute 0.1 0 - 0.1 K/uL  Troponin I     Status: None   Collection Time: 08/15/14  7:51 PM  Result Value Ref Range   Troponin I <0.03 <0.031 ng/mL    Comment:        NO INDICATION OF MYOCARDIAL INJURY.   Basic  metabolic panel     Status: Abnormal   Collection Time: 09/06/14  3:43 PM  Result Value Ref Range   Sodium 136 135 - 145 mmol/L   Potassium 5.3 (H) 3.5 - 5.1 mmol/L   Chloride 106 101 - 111 mmol/L   CO2 22 22 - 32 mmol/L   Glucose, Bld 176 (H) 65 - 99 mg/dL   BUN 27 (H) 6 - 20 mg/dL   Creatinine, Ser 1.74 (H) 0.61 - 1.24 mg/dL   Calcium 9.3 8.9 - 10.3 mg/dL   GFR calc non Af Amer 42 (L) >60 mL/min   GFR calc Af Amer 49 (L) >60 mL/min    Comment: (NOTE) The eGFR has been calculated using the CKD EPI equation. This calculation has not been validated in all clinical situations. eGFR's persistently <60 mL/min signify possible Chronic Kidney Disease.    Anion gap 8 5 - 15  CBC     Status: Abnormal   Collection Time: 09/06/14  3:43 PM  Result Value Ref Range   WBC 6.9 3.8 - 10.6 K/uL   RBC 4.28 (L) 4.40 - 5.90 MIL/uL   Hemoglobin 12.6 (L) 13.0 - 18.0 g/dL   HCT 38.1 (L) 40.0 - 52.0 %   MCV 89.1 80.0 - 100.0 fL   MCH 29.3 26.0 - 34.0 pg   MCHC 32.9 32.0 - 36.0 g/dL   RDW  14.5 11.5 - 14.5 %   Platelets 160 150 - 440 K/uL  Troponin I     Status: None   Collection Time: 09/06/14  3:43 PM  Result Value Ref Range   Troponin I <0.03 <0.031 ng/mL    Comment:        NO INDICATION OF MYOCARDIAL INJURY.   Potassium     Status: Abnormal   Collection Time: 09/06/14  6:46 PM  Result Value Ref Range   Potassium 5.6 (H) 3.5 - 5.1 mmol/L  Potassium     Status: Abnormal   Collection Time: 09/06/14  9:27 PM  Result Value Ref Range   Potassium 5.5 (H) 3.5 - 5.1 mmol/L  Basic Metabolic Panel (BMET)     Status: Abnormal   Collection Time: 09/07/14  9:57 AM  Result Value Ref Range   Glucose 145 (H) 65 - 99 mg/dL   BUN 30 (H) 6 - 24 mg/dL   Creatinine, Ser 1.79 (H) 0.76 - 1.27 mg/dL   GFR calc non Af Amer 41 (L) >59 mL/min/1.73   GFR calc Af Amer 48 (L) >59 mL/min/1.73   BUN/Creatinine Ratio 17 9 - 20   Sodium 138 134 - 144 mmol/L   Potassium 5.7 (H) 3.5 - 5.2 mmol/L   Chloride 103 97 -  108 mmol/L   CO2 20 18 - 29 mmol/L   Calcium 9.9 8.7 - 10.2 mg/dL  Troponin I     Status: None   Collection Time: 09/08/14  3:14 PM  Result Value Ref Range   Troponin I <0.03 <0.031 ng/mL    Comment:        NO INDICATION OF MYOCARDIAL INJURY.   CBC     Status: Abnormal   Collection Time: 09/08/14  3:14 PM  Result Value Ref Range   WBC 5.1 3.8 - 10.6 K/uL   RBC 3.94 (L) 4.40 - 5.90 MIL/uL   Hemoglobin 11.4 (L) 13.0 - 18.0 g/dL   HCT 34.9 (L) 40.0 - 52.0 %   MCV 88.4 80.0 - 100.0 fL   MCH 29.0 26.0 - 34.0 pg   MCHC 32.8 32.0 - 36.0 g/dL   RDW 13.7 11.5 - 14.5 %   Platelets 156 150 - 440 K/uL  Basic metabolic panel     Status: Abnormal   Collection Time: 09/08/14  3:14 PM  Result Value Ref Range   Sodium 136 135 - 145 mmol/L   Potassium 5.6 (H) 3.5 - 5.1 mmol/L   Chloride 109 101 - 111 mmol/L   CO2 23 22 - 32 mmol/L   Glucose, Bld 208 (H) 65 - 99 mg/dL   BUN 33 (H) 6 - 20 mg/dL   Creatinine, Ser 2.01 (H) 0.61 - 1.24 mg/dL   Calcium 9.7 8.9 - 10.3 mg/dL   GFR calc non Af Amer 35 (L) >60 mL/min   GFR calc Af Amer 41 (L) >60 mL/min    Comment: (NOTE) The eGFR has been calculated using the CKD EPI equation. This calculation has not been validated in all clinical situations. eGFR's persistently <60 mL/min signify possible Chronic Kidney Disease.    Anion gap 4 (L) 5 - 15  Troponin I     Status: None   Collection Time: 09/08/14  9:43 PM  Result Value Ref Range   Troponin I <0.03 <0.031 ng/mL    Comment:        NO INDICATION OF MYOCARDIAL INJURY.   Lipid panel     Status: Abnormal   Collection  Time: 09/09/14  2:25 AM  Result Value Ref Range   Cholesterol 93 0 - 200 mg/dL   Triglycerides 355 (H) <150 mg/dL   HDL 23 (L) >92 mg/dL   Total CHOL/HDL Ratio 4.0 RATIO   VLDL 44 (H) 0 - 40 mg/dL   LDL Cholesterol 26 0 - 99 mg/dL    Comment:        Total Cholesterol/HDL:CHD Risk Coronary Heart Disease Risk Table                     Men   Women  1/2 Average Risk   3.4    3.3  Average Risk       5.0   4.4  2 X Average Risk   9.6   7.1  3 X Average Risk  23.4   11.0        Use the calculated Patient Ratio above and the CHD Risk Table to determine the patient's CHD Risk.        ATP III CLASSIFICATION (LDL):  <100     mg/dL   Optimal  180-699  mg/dL   Near or Above                    Optimal  130-159  mg/dL   Borderline  845-131  mg/dL   High  >834     mg/dL   Very High   CBC     Status: Abnormal   Collection Time: 09/09/14  2:25 AM  Result Value Ref Range   WBC 4.7 3.8 - 10.6 K/uL   RBC 3.63 (L) 4.40 - 5.90 MIL/uL   Hemoglobin 10.6 (L) 13.0 - 18.0 g/dL   HCT 34.7 (L) 08.9 - 40.9 %   MCV 87.6 80.0 - 100.0 fL   MCH 29.2 26.0 - 34.0 pg   MCHC 33.3 32.0 - 36.0 g/dL   RDW 03.9 30.2 - 73.9 %   Platelets 125 (L) 150 - 440 K/uL  Basic metabolic panel     Status: Abnormal   Collection Time: 09/09/14  2:25 AM  Result Value Ref Range   Sodium 141 135 - 145 mmol/L   Potassium 4.3 3.5 - 5.1 mmol/L   Chloride 111 101 - 111 mmol/L   CO2 24 22 - 32 mmol/L   Glucose, Bld 187 (H) 65 - 99 mg/dL   BUN 29 (H) 6 - 20 mg/dL   Creatinine, Ser 2.30 (H) 0.61 - 1.24 mg/dL   Calcium 9.0 8.9 - 42.6 mg/dL   GFR calc non Af Amer 44 (L) >60 mL/min   GFR calc Af Amer 51 (L) >60 mL/min    Comment: (NOTE) The eGFR has been calculated using the CKD EPI equation. This calculation has not been validated in all clinical situations. eGFR's persistently <60 mL/min signify possible Chronic Kidney Disease.    Anion gap 6 5 - 15  Troponin I     Status: None   Collection Time: 09/09/14  2:36 AM  Result Value Ref Range   Troponin I <0.03 <0.031 ng/mL    Comment:        NO INDICATION OF MYOCARDIAL INJURY.   Troponin I     Status: None   Collection Time: 09/09/14  8:24 AM  Result Value Ref Range   Troponin I <0.03 <0.031 ng/mL    Comment:        NO INDICATION OF MYOCARDIAL INJURY.   Glucose, capillary     Status: Abnormal  Collection Time: 09/09/14  8:47 PM  Result  Value Ref Range   Glucose-Capillary 172 (H) 65 - 99 mg/dL  Basic metabolic panel     Status: Abnormal   Collection Time: 09/10/14  4:16 AM  Result Value Ref Range   Sodium 140 135 - 145 mmol/L   Potassium 4.3 3.5 - 5.1 mmol/L   Chloride 111 101 - 111 mmol/L   CO2 24 22 - 32 mmol/L   Glucose, Bld 179 (H) 65 - 99 mg/dL   BUN 24 (H) 6 - 20 mg/dL   Creatinine, Ser 9.16 (H) 0.61 - 1.24 mg/dL   Calcium 8.7 (L) 8.9 - 10.3 mg/dL   GFR calc non Af Amer 60 (L) >60 mL/min   GFR calc Af Amer >60 >60 mL/min    Comment: (NOTE) The eGFR has been calculated using the CKD EPI equation. This calculation has not been validated in all clinical situations. eGFR's persistently <60 mL/min signify possible Chronic Kidney Disease.    Anion gap 5 5 - 15  Glucose, capillary     Status: Abnormal   Collection Time: 09/10/14 10:19 PM  Result Value Ref Range   Glucose-Capillary 182 (H) 65 - 99 mg/dL  Basic metabolic panel     Status: Abnormal   Collection Time: 09/24/14  5:56 PM  Result Value Ref Range   Sodium 136 135 - 145 mmol/L   Potassium 4.2 3.5 - 5.1 mmol/L   Chloride 101 101 - 111 mmol/L   CO2 23 22 - 32 mmol/L   Glucose, Bld 189 (H) 65 - 99 mg/dL   BUN 69 (H) 6 - 20 mg/dL   Creatinine, Ser 7.56 (H) 0.61 - 1.24 mg/dL   Calcium 9.9 8.9 - 12.5 mg/dL   GFR calc non Af Amer 15 (L) >60 mL/min   GFR calc Af Amer 18 (L) >60 mL/min    Comment: (NOTE) The eGFR has been calculated using the CKD EPI equation. This calculation has not been validated in all clinical situations. eGFR's persistently <60 mL/min signify possible Chronic Kidney Disease.    Anion gap 12 5 - 15  CBC     Status: Abnormal   Collection Time: 09/24/14  5:56 PM  Result Value Ref Range   WBC 7.6 3.8 - 10.6 K/uL   RBC 4.32 (L) 4.40 - 5.90 MIL/uL   Hemoglobin 12.3 (L) 13.0 - 18.0 g/dL   HCT 48.3 (L) 23.4 - 68.8 %   MCV 87.4 80.0 - 100.0 fL   MCH 28.6 26.0 - 34.0 pg   MCHC 32.7 32.0 - 36.0 g/dL   RDW 73.7 30.8 - 16.8 %    Platelets 202 150 - 440 K/uL  Urinalysis complete, with microscopic ([facility] only)     Status: Abnormal   Collection Time: 09/24/14  9:50 PM  Result Value Ref Range   Color, Urine YELLOW (A) YELLOW   APPearance CLOUDY (A) CLEAR   Glucose, UA NEGATIVE NEGATIVE mg/dL   Bilirubin Urine NEGATIVE NEGATIVE   Ketones, ur NEGATIVE NEGATIVE mg/dL   Specific Gravity, Urine 1.018 1.005 - 1.030   Hgb urine dipstick 1+ (A) NEGATIVE   pH 5.0 5.0 - 8.0   Protein, ur 100 (A) NEGATIVE mg/dL   Nitrite NEGATIVE NEGATIVE   Leukocytes, UA NEGATIVE NEGATIVE   RBC / HPF 0-5 0 - 5 RBC/hpf   WBC, UA 0-5 0 - 5 WBC/hpf   Bacteria, UA RARE (A) NONE SEEN   Squamous Epithelial / LPF 0-5 (A) NONE SEEN  Mucous PRESENT    Hyaline Casts, UA PRESENT    Amorphous Crystal PRESENT   Glucose, capillary     Status: None   Collection Time: 09/24/14 10:18 PM  Result Value Ref Range   Glucose-Capillary 75 65 - 99 mg/dL  Basic metabolic panel     Status: Abnormal   Collection Time: 09/25/14  4:48 AM  Result Value Ref Range   Sodium 138 135 - 145 mmol/L   Potassium 4.1 3.5 - 5.1 mmol/L   Chloride 106 101 - 111 mmol/L   CO2 24 22 - 32 mmol/L   Glucose, Bld 183 (H) 65 - 99 mg/dL   BUN 64 (H) 6 - 20 mg/dL   Creatinine, Ser 0.22 (H) 0.61 - 1.24 mg/dL   Calcium 9.0 8.9 - 57.6 mg/dL   GFR calc non Af Amer 19 (L) >60 mL/min   GFR calc Af Amer 22 (L) >60 mL/min    Comment: (NOTE) The eGFR has been calculated using the CKD EPI equation. This calculation has not been validated in all clinical situations. eGFR's persistently <60 mL/min signify possible Chronic Kidney Disease.    Anion gap 8 5 - 15  CBC     Status: Abnormal   Collection Time: 09/25/14  4:48 AM  Result Value Ref Range   WBC 6.2 3.8 - 10.6 K/uL   RBC 3.83 (L) 4.40 - 5.90 MIL/uL   Hemoglobin 11.3 (L) 13.0 - 18.0 g/dL   HCT 98.6 (L) 43.6 - 77.6 %   MCV 86.5 80.0 - 100.0 fL   MCH 29.6 26.0 - 34.0 pg   MCHC 34.2 32.0 - 36.0 g/dL   RDW 79.2 64.9 - 86.9 %    Platelets 159 150 - 440 K/uL  Cortisol     Status: None   Collection Time: 09/25/14  4:48 AM  Result Value Ref Range   Cortisol, Plasma 5.4 ug/dL    Comment: (NOTE) AM    6.7 - 22.6 ug/dL PM   <22.9       ug/dL Performed at [facility]   Glucose, capillary     Status: Abnormal   Collection Time: 09/25/14  7:45 AM  Result Value Ref Range   Glucose-Capillary 142 (H) 65 - 99 mg/dL   Comment 1 Notify RN   Glucose, capillary     Status: Abnormal   Collection Time: 09/25/14 11:58 AM  Result Value Ref Range   Glucose-Capillary 205 (H) 65 - 99 mg/dL   Comment 1 Notify RN   Protein electrophoresis, serum     Status: Abnormal   Collection Time: 09/25/14  4:12 PM  Result Value Ref Range   Total Protein ELP 5.8 (L) 6.0 - 8.5 g/dL   Albumin ELP 3.2 2.9 - 4.4 g/dL   SYYCL-6-QNAAUMFO 0.2 0.0 - 0.4 g/dL   PCBNY-5-XCOIFIZT 0.9 0.4 - 1.0 g/dL   Beta Globulin 0.8 0.7 - 1.3 g/dL   Gamma Globulin 0.7 0.4 - 1.8 g/dL   M-Spike, % Not Observed Not Observed g/dL   SPE Interp. Comment     Comment: (NOTE) The SPE pattern appears essentially unremarkable. Evidence of monoclonal protein is not apparent. Performed At: [facility] [address] [place], [place] [id number] Mila Homer MD [id number]    Comment Comment     Comment: (NOTE) Protein electrophoresis scan will follow via computer, mail, or courier delivery.    GLOBULIN, TOTAL 2.6 2.2 - 3.9 g/dL   A/G Ratio 1.2 0.7 - 1.7  Vit D  25 hydroxy (rtn osteoporosis monitoring)     Status: Abnormal   Collection Time: 09/25/14  4:12 PM  Result Value Ref Range   Vit D, 25-Hydroxy 15.2 (L) 30.0 - 100.0 ng/mL    Comment: (NOTE) Vitamin D deficiency has been defined by the [place] practice guideline as a level of serum 25-OH vitamin D less than 20 ng/mL (1,2). The Endocrine Society went on to further define vitamin D insufficiency as a level between 21 and 29 ng/mL (2). 1. IOM  (Institute of Medicine). 2010. Dietary reference   intakes for calcium and D. [place]: The   [employer]. 2. Holick MF, Binkley [place], Bischoff-Ferrari HA, et al.   Evaluation, treatment, and prevention of vitamin D   deficiency: an Endocrine Society clinical practice   guideline. JCEM. 2011 Jul; 96(7):1911-30. Performed At: [facility] [place], [place] [id number] Lindon Romp MD [id number]   Parathyroid hormone, intact (no Ca)     Status: None   Collection Time: 09/25/14  4:12 PM  Result Value Ref Range   PTH 30 15 - 65 pg/mL    Comment: (NOTE) Performed At: [facility] [place], [place] [id number] Lindon Romp MD YM:[id number]   Kappa/lambda light chains     Status: Abnormal   Collection Time: 09/25/14  4:12 PM  Result Value Ref Range   Kappa free light chain 47.11 (H) 3.30 - 19.40 mg/L   Lamda free light chains 30.05 (H) 5.71 - 26.30 mg/L   Kappa, lamda light chain ratio 1.57 0.26 - 1.65    Comment: (NOTE) Performed At: [facility] [place], [place] [id number] Lindon Romp MD PR:[id number]   ANA w/Reflex     Status: None   Collection Time: 09/25/14  4:12 PM  Result Value Ref Range   Anit Nuclear Antibody(ANA) Negative Negative    Comment: (NOTE) Performed At: [facility] [address] [place], [place] [id number] Lindon Romp MD [id number]   Glucose, capillary     Status: Abnormal   Collection Time: 09/25/14  4:23 PM  Result Value Ref Range   Glucose-Capillary 156 (H) 65 - 99 mg/dL   Comment 1 Notify RN   IFE, Urine (with Tot Prot)     Status: Abnormal   Collection Time: 09/25/14  4:29 PM  Result Value Ref Range   Time 24 hours   Total Protein, Urine 95.2 Not Estab. mg/dL   Total Protein, Urine-Ur/day Comment 30.0 - 150.0 mg/24 hr    Comment: No total volume submitted.  Unable to calculate 24 hour result.   Albumin, U 56.8 %   ALPHA 1 URINE 1.4 %    Alpha 2, Urine 8.4 %   % Beta 24.9 %   GAMMA GLOBULIN URINE 8.5 %   Free Lt Chn Excr Rate 568.00 (H) 1.35 - 24.19 mg/L    Comment: **Results verified by repeat testing**   Free Lambda Lt Chains,Ur 71.60 (H) 0.24 - 6.66 mg/L   Free Kappa/Lambda Ratio 7.93 2.04 - 10.37    Comment: (NOTE) Performed At: [facility] [place], [place] [id number] Lindon Romp MD [id number]    Immunofixation Result, Urine Comment     Comment: An apparent normal immunofixation pattern.   M-Spike, % Not Observed Not Observed %   M-Spike, Mg/24 Hr Comment Not Observed mg/24 hr    Comment: No total volume submitted.  Unable to calculate 24  hour result.   Note: Comment     Comment: (NOTE) Protein electrophoresis scan will follow via computer, mail, or courier delivery.   Glucose, capillary     Status: Abnormal   Collection Time: 09/25/14  7:36 PM  Result Value Ref Range   Glucose-Capillary 205 (H) 65 - 99 mg/dL  Renal function panel     Status: Abnormal   Collection Time: 09/26/14  4:36 AM  Result Value Ref Range   Sodium 142 135 - 145 mmol/L   Potassium 5.1 3.5 - 5.1 mmol/L   Chloride 112 (H) 101 - 111 mmol/L   CO2 25 22 - 32 mmol/L   Glucose, Bld 110 (H) 65 - 99 mg/dL   BUN 50 (H) 6 - 20 mg/dL   Creatinine, Ser 2.07 (H) 0.61 - 1.24 mg/dL   Calcium 8.9 8.9 - 10.3 mg/dL   Phosphorus 3.1 2.5 - 4.6 mg/dL   Albumin 3.6 3.5 - 5.0 g/dL   GFR calc non Af Amer 34 (L) >60 mL/min   GFR calc Af Amer 40 (L) >60 mL/min    Comment: (NOTE) The eGFR has been calculated using the CKD EPI equation. This calculation has not been validated in all clinical situations. eGFR's persistently <60 mL/min signify possible Chronic Kidney Disease.    Anion gap 5 5 - 15  Iron and TIBC     Status: None   Collection Time: 09/26/14  4:36 AM  Result Value Ref Range   Iron 79 45 - 182 ug/dL   TIBC 287 250 - 450 ug/dL   Saturation Ratios 28 17.9 - 39.5 %   UIBC 208 ug/dL  Ferritin     Status:  None   Collection Time: 09/26/14  4:36 AM  Result Value Ref Range   Ferritin 75 24 - 336 ng/mL  Glucose, capillary     Status: None   Collection Time: 09/26/14  7:19 AM  Result Value Ref Range   Glucose-Capillary 95 65 - 99 mg/dL   Comment 1 Notify RN   Glucose, capillary     Status: Abnormal   Collection Time: 09/26/14 11:13 AM  Result Value Ref Range   Glucose-Capillary 246 (H) 65 - 99 mg/dL   Comment 1 Notify RN   Glucose, capillary     Status: Abnormal   Collection Time: 09/26/14  4:04 PM  Result Value Ref Range   Glucose-Capillary 184 (H) 65 - 99 mg/dL   Comment 1 Notify RN   Glucose, capillary     Status: Abnormal   Collection Time: 09/26/14  8:50 PM  Result Value Ref Range   Glucose-Capillary 163 (H) 65 - 99 mg/dL  Glucose, capillary     Status: Abnormal   Collection Time: 09/27/14  7:28 AM  Result Value Ref Range   Glucose-Capillary 110 (H) 65 - 99 mg/dL   Comment 1 Notify RN   Basic metabolic panel     Status: Abnormal   Collection Time: 09/27/14  8:18 AM  Result Value Ref Range   Sodium 141 135 - 145 mmol/L   Potassium 5.0 3.5 - 5.1 mmol/L   Chloride 112 (H) 101 - 111 mmol/L   CO2 24 22 - 32 mmol/L   Glucose, Bld 109 (H) 65 - 99 mg/dL   BUN 37 (H) 6 - 20 mg/dL   Creatinine, Ser 1.68 (H) 0.61 - 1.24 mg/dL   Calcium 9.0 8.9 - 10.3 mg/dL   GFR calc non Af Amer 44 (L) >60 mL/min   GFR calc  Af Amer 51 (L) >60 mL/min    Comment: (NOTE) The eGFR has been calculated using the CKD EPI equation. This calculation has not been validated in all clinical situations. eGFR's persistently <60 mL/min signify possible Chronic Kidney Disease.    Anion gap 5 5 - 15  Glucose, capillary     Status: Abnormal   Collection Time: 09/27/14 11:28 AM  Result Value Ref Range   Glucose-Capillary 152 (H) 65 - 99 mg/dL   Comment 1 Notify RN   CBC     Status: Abnormal   Collection Time: 10/30/14  7:40 PM  Result Value Ref Range   WBC 6.6 3.8 - 10.6 K/uL   RBC 3.82 (L) 4.40 - 5.90  MIL/uL   Hemoglobin 11.4 (L) 13.0 - 18.0 g/dL   HCT 98.5 (L) 39.2 - 12.9 %   MCV 88.5 80.0 - 100.0 fL   MCH 29.8 26.0 - 34.0 pg   MCHC 33.7 32.0 - 36.0 g/dL   RDW 98.8 58.9 - 88.7 %   Platelets 187 150 - 440 K/uL  Basic metabolic panel     Status: Abnormal   Collection Time: 10/30/14  7:40 PM  Result Value Ref Range   Sodium 138 135 - 145 mmol/L   Potassium 5.2 (H) 3.5 - 5.1 mmol/L   Chloride 108 101 - 111 mmol/L   CO2 22 22 - 32 mmol/L   Glucose, Bld 163 (H) 65 - 99 mg/dL   BUN 44 (H) 6 - 20 mg/dL   Creatinine, Ser 1.55 (H) 0.61 - 1.24 mg/dL   Calcium 9.6 8.9 - 09.5 mg/dL   GFR calc non Af Amer >60 >60 mL/min   GFR calc Af Amer >60 >60 mL/min    Comment: (NOTE) The eGFR has been calculated using the CKD EPI equation. This calculation has not been validated in all clinical situations. eGFR's persistently <60 mL/min signify possible Chronic Kidney Disease.    Anion gap 8 5 - 15  Troponin I     Status: None   Collection Time: 10/30/14  7:40 PM  Result Value Ref Range   Troponin I <0.03 <0.031 ng/mL    Comment:        NO INDICATION OF MYOCARDIAL INJURY.   Troponin I     Status: None   Collection Time: 10/31/14 12:53 AM  Result Value Ref Range   Troponin I <0.03 <0.031 ng/mL    Comment:        NO INDICATION OF MYOCARDIAL INJURY.   Basic metabolic panel     Status: Abnormal   Collection Time: 10/31/14  5:24 AM  Result Value Ref Range   Sodium 140 135 - 145 mmol/L   Potassium 4.4 3.5 - 5.1 mmol/L   Chloride 110 101 - 111 mmol/L   CO2 25 22 - 32 mmol/L   Glucose, Bld 129 (H) 65 - 99 mg/dL   BUN 40 (H) 6 - 20 mg/dL   Creatinine, Ser 7.36 0.61 - 1.24 mg/dL   Calcium 9.3 8.9 - 11.6 mg/dL   GFR calc non Af Amer >60 >60 mL/min   GFR calc Af Amer >60 >60 mL/min    Comment: (NOTE) The eGFR has been calculated using the CKD EPI equation. This calculation has not been validated in all clinical situations. eGFR's persistently <60 mL/min signify possible Chronic  Kidney Disease.    Anion gap 5 5 - 15  CBC     Status: Abnormal   Collection Time: 10/31/14  5:24 AM  Result Value Ref Range   WBC 5.7 3.8 - 10.6 K/uL   RBC 3.73 (L) 4.40 - 5.90 MIL/uL   Hemoglobin 11.0 (L) 13.0 - 18.0 g/dL   HCT 32.7 (L) 40.0 - 52.0 %   MCV 87.9 80.0 - 100.0 fL   MCH 29.5 26.0 - 34.0 pg   MCHC 33.6 32.0 - 36.0 g/dL   RDW 13.7 11.5 - 14.5 %   Platelets 163 150 - 440 K/uL  Hemoglobin A1c     Status: Abnormal   Collection Time: 10/31/14  5:24 AM  Result Value Ref Range   Hgb A1c MFr Bld 7.6 (H) 4.0 - 6.0 %  Glucose, capillary     Status: Abnormal   Collection Time: 10/31/14  7:13 AM  Result Value Ref Range   Glucose-Capillary 111 (H) 65 - 99 mg/dL  Glucose, capillary     Status: Abnormal   Collection Time: 10/31/14 11:07 AM  Result Value Ref Range   Glucose-Capillary 136 (H) 65 - 99 mg/dL   Comment 1 Notify RN      PHQ2/9: Depression screen [facility] 2/9 10/10/2014 07/30/2014  Decreased Interest 0 1  Down, Depressed, Hopeless 0 1  PHQ - 2 Score 0 2  Altered sleeping - 3  Tired, decreased energy - 2  Change in appetite - 2  Feeling bad or failure about yourself  - 1  Trouble concentrating - 1  Moving slowly or fidgety/restless - 2  Suicidal thoughts - 0  PHQ-9 Score - 13  Difficult doing work/chores - Somewhat difficult     Fall Risk: Fall Risk  10/10/2014 07/30/2014  Falls in the past year? No No    Assessment & Plan  1. Uncontrolled hypertension  Doing better, but bp at home still going up and down, today and also during Dr. Marcy Salvo visit last Friday the bp is at goal. He is going back to nephrologist next week for rechecking in bp   2. Insomnia  advised to take Seroquel in bed, not before bed to avoid risk of falls  3. Mild episode of recurrent major depressive disorder ([place])  Continue medication, advised to get out of the house  4. Type 2 diabetes mellitus with stage 3 chronic kidney disease, with long-term current use of insulin (HCC)    Decrease Tresiba to 8 units daily since he is having hypoglycemia

## 2014-11-20 ENCOUNTER — Telehealth: Payer: Self-pay | Admitting: Family Medicine

## 2014-11-20 NOTE — Telephone Encounter (Signed)
He can try 3 units per day, and have a protein snack ( peanut butter ) before going to bed

## 2014-11-20 NOTE — Telephone Encounter (Signed)
Requesting return call. Patient is currently taking [place] and was told that they could scale back. She has started giving him 5 units at night. Yesterday morning his level was 67 and this morning it was 47. His sugar levels are good during the day. Would like to know if it is okay to change the time of giving the [place] or should she change the units again?

## 2014-11-20 NOTE — Telephone Encounter (Signed)
Patient notified and will try going back to 3 units daily

## 2014-11-27 ENCOUNTER — Other Ambulatory Visit: Payer: Self-pay | Admitting: Family Medicine

## 2014-11-27 MED ORDER — SODIUM BICARBONATE 650 MG PO TABS: 650.0000 mg | ORAL_TABLET | Freq: Two times a day (BID) | ORAL | Status: DC

## 2014-12-06 ENCOUNTER — Ambulatory Visit (INDEPENDENT_AMBULATORY_CARE_PROVIDER_SITE_OTHER): Payer: Medicaid Other | Admitting: Family Medicine

## 2014-12-06 ENCOUNTER — Encounter: Payer: Self-pay | Admitting: Family Medicine

## 2014-12-06 VITALS — BP 122/84 | HR 89 | Temp 98.6°F | Resp 16 | Ht 67.0 in | Wt 230.1 lb

## 2014-12-06 DIAGNOSIS — J069 Acute upper respiratory infection, unspecified: Secondary | ICD-10-CM

## 2014-12-06 MED ORDER — HYDROCOD POLST-CPM POLST ER 10-8 MG/5ML PO SUER: 5.0000 mL | Freq: Two times a day (BID) | ORAL | Status: DC | PRN

## 2014-12-06 MED ORDER — BENZONATATE 100 MG PO CAPS: 100.0000 mg | ORAL_CAPSULE | Freq: Three times a day (TID) | ORAL | Status: DC | PRN

## 2014-12-06 NOTE — Progress Notes (Signed)
Name: Angel Cruz   MRN: [id number]    DOB: 08-07-1955   Date:12/06/2014       Progress Note  Subjective  Chief Complaint  Chief Complaint  Patient presents with  . URI    Onset 1 week. Unchanged symptoms include cough, runny nose, nasal congestion, headaches. Taking OTC mucinex.    HPI  URI: he states that one week ago, developed some post-nasal drainage, nasal congestion, no sore throat or ear pain, no fever. He has developed a productive cough , with yellow sputum over the past 5 days, he also has some headaches.   No wheezing, no change in appetite, no rashes  Patient Active Problem List   Diagnosis Date Noted  . Insomnia 11/12/2014  . Uncontrolled hypertension 10/31/2014  . Essential hypertension, malignant 10/31/2014  . OSA (obstructive sleep apnea) 09/28/2014  . Anemia 09/14/2014  . Cardiomyopathy, ischemic   . Orthostatic hypotension 09/07/2014  . Angioedema 07/30/2014  . RLS (restless legs syndrome) 07/30/2014  . B12 deficiency 07/30/2014  . Vitamin D deficiency 07/30/2014  . Chronic systolic heart failure ([place]) 11/14/2013  . Ischemic cardiomyopathy   . CAD (coronary artery disease)   . Hyperlipidemia   . Uncontrolled diabetes mellitus with stage 3 chronic kidney disease, with long-term current use of insulin ([place])   . Major depression, recurrent ([place])   . CKD (chronic kidney disease), stage III     Past Surgical History  Procedure Laterality Date  . Cardiac catheterization  10/19/2013    [facility]  . Uvulectomy  1995  . Atrial fibrillation ablation  1995  . Elbow surgery Left   . Rotator cuff repair    . Carpal tunnel release      Family History  Problem Relation Age of Onset  . Stroke Mother   . COPD Mother   . Heart failure Mother   . Heart attack Mother   . Colon cancer Father   . Diabetes Father   . Diabetes Brother   . Cancer Brother     Social History   Social History  . Marital Status: Married    Spouse Name: [name]  . Number of Children:  2  . Years of Education: N/A   Occupational History  . Not on file.   Social History Main Topics  . Smoking status: Never Smoker   . Smokeless tobacco: Never Used  . Alcohol Use: No  . Drug Use: No  . Sexual Activity: No   Other Topics Concern  . Not on file   Social History Narrative     Current outpatient prescriptions:  .  amLODipine (NORVASC) 5 MG tablet, Take 1 tablet (5 mg total) by mouth daily., Disp: 30 tablet, Rfl: 6 .  aspirin EC 81 MG tablet, Take 81 mg by mouth daily., Disp: , Rfl:  .  carvedilol (COREG) 3.125 MG tablet, Take 3.125 mg by mouth 2 (two) times daily with a meal., Disp: , Rfl:  .  CRESTOR 20 MG tablet, TAKE 1 TABLET(20 MG) BY MOUTH AT BEDTIME, Disp: 30 tablet, Rfl: 5 .  Cyanocobalamin (VITAMIN B-12 PO), Take 1 tablet by mouth daily., Disp: , Rfl:  .  escitalopram (LEXAPRO) 10 MG tablet, Take 1 tablet (10 mg total) by mouth daily., Disp: 90 tablet, Rfl: 1 .  Insulin Degludec (TRESIBA FLEXTOUCH) 200 UNIT/ML SOPN, Inject 8 Units into the skin daily., Disp: 5 pen, Rfl: 2 .  nitroGLYCERIN (NITROSTAT) 0.4 MG SL tablet, Place 0.4 mg under the tongue every 5 (five) minutes  as needed for chest pain., Disp: , Rfl:  .  QUEtiapine (SEROQUEL) 100 MG tablet, Take 1 tablet (100 mg total) by mouth at bedtime., Disp: 90 tablet, Rfl: 0 .  rOPINIRole (REQUIP) 0.5 MG tablet, Take 1 tablet (0.5 mg total) by mouth at bedtime., Disp: 30 tablet, Rfl: 5 .  sodium bicarbonate 650 MG tablet, Take 1 tablet (650 mg total) by mouth 2 (two) times daily., Disp: 60 tablet, Rfl: 0  No Known Allergies   ROS  Constitutional: Negative for fever or weight change.  Respiratory: Negative for cough and shortness of breath.   Cardiovascular: Negative for chest pain or palpitations.  Gastrointestinal: Negative for abdominal pain, no bowel changes.  Musculoskeletal: Negative for gait problem or joint swelling.  Skin: Negative for rash.  Neurological: Negative for dizziness , positive for   headache.  No other specific complaints in a complete review of systems (except as listed in HPI above). Also has blurred vision  Objective  Filed Vitals:   12/06/14 1211  BP: 122/84  Pulse: 89  Temp: 98.6 F (37 C)  TempSrc: Oral  Resp: 16  Height: $Remove'5\' 7"'MTdEBuZ$  (1.702 m)  Weight: 230 lb 1.6 oz (104.373 kg)  SpO2: 94%    Body mass index is 36.03 kg/(m^2).  Physical Exam  Constitutional: Patient appears well-developed and well-nourished. Obese No distress.  HEENT: head atraumatic, normocephalic, pupils equal and reactive to light, neck supple, TM ; normal bilaterally , s/p uvuloplasty, boggy turbinates with clear rhinorrhea Cardiovascular: Normal rate, regular rhythm and normal heart sounds.  No murmur heard. No BLE edema. Pulmonary/Chest: Effort normal and breath sounds normal. No respiratory distress. Abdominal: Soft.  There is no tenderness. Psychiatric: Patient has a normal mood and affect. behavior is normal. Judgment and thought content normal.  Recent Results (from the past 2160 hour(s))  Troponin I     Status: None   Collection Time: 09/08/14  3:14 PM  Result Value Ref Range   Troponin I <0.03 <0.031 ng/mL    Comment:        NO INDICATION OF MYOCARDIAL INJURY.   CBC     Status: Abnormal   Collection Time: 09/08/14  3:14 PM  Result Value Ref Range   WBC 5.1 3.8 - 10.6 K/uL   RBC 3.94 (L) 4.40 - 5.90 MIL/uL   Hemoglobin 11.4 (L) 13.0 - 18.0 g/dL   HCT 34.9 (L) 40.0 - 52.0 %   MCV 88.4 80.0 - 100.0 fL   MCH 29.0 26.0 - 34.0 pg   MCHC 32.8 32.0 - 36.0 g/dL   RDW 13.7 11.5 - 14.5 %   Platelets 156 150 - 440 K/uL  Basic metabolic panel     Status: Abnormal   Collection Time: 09/08/14  3:14 PM  Result Value Ref Range   Sodium 136 135 - 145 mmol/L   Potassium 5.6 (H) 3.5 - 5.1 mmol/L   Chloride 109 101 - 111 mmol/L   CO2 23 22 - 32 mmol/L   Glucose, Bld 208 (H) 65 - 99 mg/dL   BUN 33 (H) 6 - 20 mg/dL   Creatinine, Ser 2.01 (H) 0.61 - 1.24 mg/dL   Calcium 9.7 8.9 -  10.3 mg/dL   GFR calc non Af Amer 35 (L) >60 mL/min   GFR calc Af Amer 41 (L) >60 mL/min    Comment: (NOTE) The eGFR has been calculated using the CKD EPI equation. This calculation has not been validated in all clinical situations. eGFR's persistently <60 mL/min signify  possible Chronic Kidney Disease.    Anion gap 4 (L) 5 - 15  Troponin I     Status: None   Collection Time: 09/08/14  9:43 PM  Result Value Ref Range   Troponin I <0.03 <0.031 ng/mL    Comment:        NO INDICATION OF MYOCARDIAL INJURY.   Lipid panel     Status: Abnormal   Collection Time: 09/09/14  2:25 AM  Result Value Ref Range   Cholesterol 93 0 - 200 mg/dL   Triglycerides 220 (H) <150 mg/dL   HDL 23 (L) >40 mg/dL   Total CHOL/HDL Ratio 4.0 RATIO   VLDL 44 (H) 0 - 40 mg/dL   LDL Cholesterol 26 0 - 99 mg/dL    Comment:        Total Cholesterol/HDL:CHD Risk Coronary Heart Disease Risk Table                     Men   Women  1/2 Average Risk   3.4   3.3  Average Risk       5.0   4.4  2 X Average Risk   9.6   7.1  3 X Average Risk  23.4   11.0        Use the calculated Patient Ratio above and the CHD Risk Table to determine the patient's CHD Risk.        ATP III CLASSIFICATION (LDL):  <100     mg/dL   Optimal  100-129  mg/dL   Near or Above                    Optimal  130-159  mg/dL   Borderline  160-189  mg/dL   High  >190     mg/dL   Very High   CBC     Status: Abnormal   Collection Time: 09/09/14  2:25 AM  Result Value Ref Range   WBC 4.7 3.8 - 10.6 K/uL   RBC 3.63 (L) 4.40 - 5.90 MIL/uL   Hemoglobin 10.6 (L) 13.0 - 18.0 g/dL   HCT 31.8 (L) 40.0 - 52.0 %   MCV 87.6 80.0 - 100.0 fL   MCH 29.2 26.0 - 34.0 pg   MCHC 33.3 32.0 - 36.0 g/dL   RDW 14.2 11.5 - 14.5 %   Platelets 125 (L) 150 - 440 K/uL  Basic metabolic panel     Status: Abnormal   Collection Time: 09/09/14  2:25 AM  Result Value Ref Range   Sodium 141 135 - 145 mmol/L   Potassium 4.3 3.5 - 5.1 mmol/L   Chloride 111 101 - 111  mmol/L   CO2 24 22 - 32 mmol/L   Glucose, Bld 187 (H) 65 - 99 mg/dL   BUN 29 (H) 6 - 20 mg/dL   Creatinine, Ser 1.69 (H) 0.61 - 1.24 mg/dL   Calcium 9.0 8.9 - 10.3 mg/dL   GFR calc non Af Amer 44 (L) >60 mL/min   GFR calc Af Amer 51 (L) >60 mL/min    Comment: (NOTE) The eGFR has been calculated using the CKD EPI equation. This calculation has not been validated in all clinical situations. eGFR's persistently <60 mL/min signify possible Chronic Kidney Disease.    Anion gap 6 5 - 15  Troponin I     Status: None   Collection Time: 09/09/14  2:36 AM  Result Value Ref Range   Troponin I <0.03 <0.031 ng/mL  Comment:        NO INDICATION OF MYOCARDIAL INJURY.   Troponin I     Status: None   Collection Time: 09/09/14  8:24 AM  Result Value Ref Range   Troponin I <0.03 <0.031 ng/mL    Comment:        NO INDICATION OF MYOCARDIAL INJURY.   Glucose, capillary     Status: Abnormal   Collection Time: 09/09/14  8:47 PM  Result Value Ref Range   Glucose-Capillary 172 (H) 65 - 99 mg/dL  Basic metabolic panel     Status: Abnormal   Collection Time: 09/10/14  4:16 AM  Result Value Ref Range   Sodium 140 135 - 145 mmol/L   Potassium 4.3 3.5 - 5.1 mmol/L   Chloride 111 101 - 111 mmol/L   CO2 24 22 - 32 mmol/L   Glucose, Bld 179 (H) 65 - 99 mg/dL   BUN 24 (H) 6 - 20 mg/dL   Creatinine, Ser 1.96 (H) 0.61 - 1.24 mg/dL   Calcium 8.7 (L) 8.9 - 10.3 mg/dL   GFR calc non Af Amer 60 (L) >60 mL/min   GFR calc Af Amer >60 >60 mL/min    Comment: (NOTE) The eGFR has been calculated using the CKD EPI equation. This calculation has not been validated in all clinical situations. eGFR's persistently <60 mL/min signify possible Chronic Kidney Disease.    Anion gap 5 5 - 15  Glucose, capillary     Status: Abnormal   Collection Time: 09/10/14 10:19 PM  Result Value Ref Range   Glucose-Capillary 182 (H) 65 - 99 mg/dL  Basic metabolic panel     Status: Abnormal   Collection Time: 09/24/14  5:56  PM  Result Value Ref Range   Sodium 136 135 - 145 mmol/L   Potassium 4.2 3.5 - 5.1 mmol/L   Chloride 101 101 - 111 mmol/L   CO2 23 22 - 32 mmol/L   Glucose, Bld 189 (H) 65 - 99 mg/dL   BUN 69 (H) 6 - 20 mg/dL   Creatinine, Ser 8.91 (H) 0.61 - 1.24 mg/dL   Calcium 9.9 8.9 - 01.9 mg/dL   GFR calc non Af Amer 15 (L) >60 mL/min   GFR calc Af Amer 18 (L) >60 mL/min    Comment: (NOTE) The eGFR has been calculated using the CKD EPI equation. This calculation has not been validated in all clinical situations. eGFR's persistently <60 mL/min signify possible Chronic Kidney Disease.    Anion gap 12 5 - 15  CBC     Status: Abnormal   Collection Time: 09/24/14  5:56 PM  Result Value Ref Range   WBC 7.6 3.8 - 10.6 K/uL   RBC 4.32 (L) 4.40 - 5.90 MIL/uL   Hemoglobin 12.3 (L) 13.0 - 18.0 g/dL   HCT 19.9 (L) 18.1 - 12.7 %   MCV 87.4 80.0 - 100.0 fL   MCH 28.6 26.0 - 34.0 pg   MCHC 32.7 32.0 - 36.0 g/dL   RDW 93.6 82.8 - 61.3 %   Platelets 202 150 - 440 K/uL  Urinalysis complete, with microscopic ([facility] only)     Status: Abnormal   Collection Time: 09/24/14  9:50 PM  Result Value Ref Range   Color, Urine YELLOW (A) YELLOW   APPearance CLOUDY (A) CLEAR   Glucose, UA NEGATIVE NEGATIVE mg/dL   Bilirubin Urine NEGATIVE NEGATIVE   Ketones, ur NEGATIVE NEGATIVE mg/dL   Specific Gravity, Urine 1.018 1.005 - 1.030  Hgb urine dipstick 1+ (A) NEGATIVE   pH 5.0 5.0 - 8.0   Protein, ur 100 (A) NEGATIVE mg/dL   Nitrite NEGATIVE NEGATIVE   Leukocytes, UA NEGATIVE NEGATIVE   RBC / HPF 0-5 0 - 5 RBC/hpf   WBC, UA 0-5 0 - 5 WBC/hpf   Bacteria, UA RARE (A) NONE SEEN   Squamous Epithelial / LPF 0-5 (A) NONE SEEN   Mucous PRESENT    Hyaline Casts, UA PRESENT    Amorphous Crystal PRESENT   Glucose, capillary     Status: None   Collection Time: 09/24/14 10:18 PM  Result Value Ref Range   Glucose-Capillary 75 65 - 99 mg/dL  Basic metabolic panel     Status: Abnormal   Collection Time: 09/25/14  4:48  AM  Result Value Ref Range   Sodium 138 135 - 145 mmol/L   Potassium 4.1 3.5 - 5.1 mmol/L   Chloride 106 101 - 111 mmol/L   CO2 24 22 - 32 mmol/L   Glucose, Bld 183 (H) 65 - 99 mg/dL   BUN 64 (H) 6 - 20 mg/dL   Creatinine, Ser 3.36 (H) 0.61 - 1.24 mg/dL   Calcium 9.0 8.9 - 10.3 mg/dL   GFR calc non Af Amer 19 (L) >60 mL/min   GFR calc Af Amer 22 (L) >60 mL/min    Comment: (NOTE) The eGFR has been calculated using the CKD EPI equation. This calculation has not been validated in all clinical situations. eGFR's persistently <60 mL/min signify possible Chronic Kidney Disease.    Anion gap 8 5 - 15  CBC     Status: Abnormal   Collection Time: 09/25/14  4:48 AM  Result Value Ref Range   WBC 6.2 3.8 - 10.6 K/uL   RBC 3.83 (L) 4.40 - 5.90 MIL/uL   Hemoglobin 11.3 (L) 13.0 - 18.0 g/dL   HCT 33.1 (L) 40.0 - 52.0 %   MCV 86.5 80.0 - 100.0 fL   MCH 29.6 26.0 - 34.0 pg   MCHC 34.2 32.0 - 36.0 g/dL   RDW 13.4 11.5 - 14.5 %   Platelets 159 150 - 440 K/uL  Cortisol     Status: None   Collection Time: 09/25/14  4:48 AM  Result Value Ref Range   Cortisol, Plasma 5.4 ug/dL    Comment: (NOTE) AM    6.7 - 22.6 ug/dL PM   <10.0       ug/dL Performed at [facility]   Glucose, capillary     Status: Abnormal   Collection Time: 09/25/14  7:45 AM  Result Value Ref Range   Glucose-Capillary 142 (H) 65 - 99 mg/dL   Comment 1 Notify RN   Glucose, capillary     Status: Abnormal   Collection Time: 09/25/14 11:58 AM  Result Value Ref Range   Glucose-Capillary 205 (H) 65 - 99 mg/dL   Comment 1 Notify RN   Protein electrophoresis, serum     Status: Abnormal   Collection Time: 09/25/14  4:12 PM  Result Value Ref Range   Total Protein ELP 5.8 (L) 6.0 - 8.5 g/dL   Albumin ELP 3.2 2.9 - 4.4 g/dL   Alpha-1-Globulin 0.2 0.0 - 0.4 g/dL   Alpha-2-Globulin 0.9 0.4 - 1.0 g/dL   Beta Globulin 0.8 0.7 - 1.3 g/dL   Gamma Globulin 0.7 0.4 - 1.8 g/dL   M-Spike, % Not Observed Not Observed g/dL    SPE Interp. Comment     Comment: (NOTE) The  SPE pattern appears essentially unremarkable. Evidence of monoclonal protein is not apparent. Performed At: [facility] [place], [place] [id number] Lindon Romp MD [id number]    Comment Comment     Comment: (NOTE) Protein electrophoresis scan will follow via computer, mail, or courier delivery.    GLOBULIN, TOTAL 2.6 2.2 - 3.9 g/dL   A/G Ratio 1.2 0.7 - 1.7  Vit D  25 hydroxy (rtn osteoporosis monitoring)     Status: Abnormal   Collection Time: 09/25/14  4:12 PM  Result Value Ref Range   Vit D, 25-Hydroxy 15.2 (L) 30.0 - 100.0 ng/mL    Comment: (NOTE) Vitamin D deficiency has been defined by the [place] and an Endocrine Society practice guideline as a level of serum 25-OH vitamin D less than 20 ng/mL (1,2). The Endocrine Society went on to further define vitamin D insufficiency as a level between 21 and 29 ng/mL (2). 1. IOM (Institute of Medicine). 2010. Dietary reference   intakes for calcium and D. [place]: The   [employer]. 2. Holick MF, Binkley Burt, Bischoff-Ferrari HA, et al.   Evaluation, treatment, and prevention of vitamin D   deficiency: an Endocrine Society clinical practice   guideline. JCEM. 2011 Jul; 96(7):1911-30. Performed At: [facility] [place], [place] [id number] Lindon Romp MD [id number]   Parathyroid hormone, intact (no Ca)     Status: None   Collection Time: 09/25/14  4:12 PM  Result Value Ref Range   PTH 30 15 - 65 pg/mL    Comment: (NOTE) Performed At: [facility] [place], [place] [id number] Lindon Romp MD [id number]   Kappa/lambda light chains     Status: Abnormal   Collection Time: 09/25/14  4:12 PM  Result Value Ref Range   Kappa free light chain 47.11 (H) 3.30 - 19.40 mg/L   Lamda free light chains 30.05 (H) 5.71 - 26.30 mg/L   Kappa, lamda light chain ratio 1.57 0.26 -  1.65    Comment: (NOTE) Performed At: [facility] [place], [place] [id number] Lindon Romp MD [id number]   ANA w/Reflex     Status: None   Collection Time: 09/25/14  4:12 PM  Result Value Ref Range   Anit Nuclear Antibody(ANA) Negative Negative    Comment: (NOTE) Performed At: [facility] [address] [place], [place] [id number] Lindon Romp MD [id number]   Glucose, capillary     Status: Abnormal   Collection Time: 09/25/14  4:23 PM  Result Value Ref Range   Glucose-Capillary 156 (H) 65 - 99 mg/dL   Comment 1 Notify RN   IFE, Urine (with Tot Prot)     Status: Abnormal   Collection Time: 09/25/14  4:29 PM  Result Value Ref Range   Time 24 hours   Total Protein, Urine 95.2 Not Estab. mg/dL   Total Protein, Urine-Ur/day Comment 30.0 - 150.0 mg/24 hr    Comment: No total volume submitted.  Unable to calculate 24 hour result.   Albumin, U 56.8 %   ALPHA 1 URINE 1.4 %   Alpha 2, Urine 8.4 %   % Beta 24.9 %   GAMMA GLOBULIN URINE 8.5 %   Free Lt Chn Excr Rate 568.00 (H) 1.35 - 24.19 mg/L    Comment: **Results verified by repeat testing**   Free Lambda Lt Chains,Ur 71.60 (H) 0.24 - 6.66 mg/L   Free Kappa/Lambda Ratio 7.93 2.04 -  10.37    Comment: (NOTE) Performed At: [facility] [place], [place] [id number] Lindon Romp MD [id number]    Immunofixation Result, Urine Comment     Comment: An apparent normal immunofixation pattern.   M-Spike, % Not Observed Not Observed %   M-Spike, Mg/24 Hr Comment Not Observed mg/24 hr    Comment: No total volume submitted.  Unable to calculate 24 hour result.   Note: Comment     Comment: (NOTE) Protein electrophoresis scan will follow via computer, mail, or courier delivery.   Glucose, capillary     Status: Abnormal   Collection Time: 09/25/14  7:36 PM  Result Value Ref Range   Glucose-Capillary 205 (H) 65 - 99 mg/dL  Renal function panel     Status: Abnormal    Collection Time: 09/26/14  4:36 AM  Result Value Ref Range   Sodium 142 135 - 145 mmol/L   Potassium 5.1 3.5 - 5.1 mmol/L   Chloride 112 (H) 101 - 111 mmol/L   CO2 25 22 - 32 mmol/L   Glucose, Bld 110 (H) 65 - 99 mg/dL   BUN 50 (H) 6 - 20 mg/dL   Creatinine, Ser 2.07 (H) 0.61 - 1.24 mg/dL   Calcium 8.9 8.9 - 10.3 mg/dL   Phosphorus 3.1 2.5 - 4.6 mg/dL   Albumin 3.6 3.5 - 5.0 g/dL   GFR calc non Af Amer 34 (L) >60 mL/min   GFR calc Af Amer 40 (L) >60 mL/min    Comment: (NOTE) The eGFR has been calculated using the CKD EPI equation. This calculation has not been validated in all clinical situations. eGFR's persistently <60 mL/min signify possible Chronic Kidney Disease.    Anion gap 5 5 - 15  Iron and TIBC     Status: None   Collection Time: 09/26/14  4:36 AM  Result Value Ref Range   Iron 79 45 - 182 ug/dL   TIBC 287 250 - 450 ug/dL   Saturation Ratios 28 17.9 - 39.5 %   UIBC 208 ug/dL  Ferritin     Status: None   Collection Time: 09/26/14  4:36 AM  Result Value Ref Range   Ferritin 75 24 - 336 ng/mL  Glucose, capillary     Status: None   Collection Time: 09/26/14  7:19 AM  Result Value Ref Range   Glucose-Capillary 95 65 - 99 mg/dL   Comment 1 Notify RN   Glucose, capillary     Status: Abnormal   Collection Time: 09/26/14 11:13 AM  Result Value Ref Range   Glucose-Capillary 246 (H) 65 - 99 mg/dL   Comment 1 Notify RN   Glucose, capillary     Status: Abnormal   Collection Time: 09/26/14  4:04 PM  Result Value Ref Range   Glucose-Capillary 184 (H) 65 - 99 mg/dL   Comment 1 Notify RN   Glucose, capillary     Status: Abnormal   Collection Time: 09/26/14  8:50 PM  Result Value Ref Range   Glucose-Capillary 163 (H) 65 - 99 mg/dL  Glucose, capillary     Status: Abnormal   Collection Time: 09/27/14  7:28 AM  Result Value Ref Range   Glucose-Capillary 110 (H) 65 - 99 mg/dL   Comment 1 Notify RN   Basic metabolic panel     Status: Abnormal   Collection Time: 09/27/14   8:18 AM  Result Value Ref Range   Sodium 141 135 - 145 mmol/L   Potassium 5.0 3.5 -  5.1 mmol/L   Chloride 112 (H) 101 - 111 mmol/L   CO2 24 22 - 32 mmol/L   Glucose, Bld 109 (H) 65 - 99 mg/dL   BUN 37 (H) 6 - 20 mg/dL   Creatinine, Ser 1.68 (H) 0.61 - 1.24 mg/dL   Calcium 9.0 8.9 - 10.3 mg/dL   GFR calc non Af Amer 44 (L) >60 mL/min   GFR calc Af Amer 51 (L) >60 mL/min    Comment: (NOTE) The eGFR has been calculated using the CKD EPI equation. This calculation has not been validated in all clinical situations. eGFR's persistently <60 mL/min signify possible Chronic Kidney Disease.    Anion gap 5 5 - 15  Glucose, capillary     Status: Abnormal   Collection Time: 09/27/14 11:28 AM  Result Value Ref Range   Glucose-Capillary 152 (H) 65 - 99 mg/dL   Comment 1 Notify RN   CBC     Status: Abnormal   Collection Time: 10/30/14  7:40 PM  Result Value Ref Range   WBC 6.6 3.8 - 10.6 K/uL   RBC 3.82 (L) 4.40 - 5.90 MIL/uL   Hemoglobin 11.4 (L) 13.0 - 18.0 g/dL   HCT 33.8 (L) 40.0 - 52.0 %   MCV 88.5 80.0 - 100.0 fL   MCH 29.8 26.0 - 34.0 pg   MCHC 33.7 32.0 - 36.0 g/dL   RDW 14.1 11.5 - 14.5 %   Platelets 187 150 - 440 K/uL  Basic metabolic panel     Status: Abnormal   Collection Time: 10/30/14  7:40 PM  Result Value Ref Range   Sodium 138 135 - 145 mmol/L   Potassium 5.2 (H) 3.5 - 5.1 mmol/L   Chloride 108 101 - 111 mmol/L   CO2 22 22 - 32 mmol/L   Glucose, Bld 163 (H) 65 - 99 mg/dL   BUN 44 (H) 6 - 20 mg/dL   Creatinine, Ser 1.29 (H) 0.61 - 1.24 mg/dL   Calcium 9.6 8.9 - 10.3 mg/dL   GFR calc non Af Amer >60 >60 mL/min   GFR calc Af Amer >60 >60 mL/min    Comment: (NOTE) The eGFR has been calculated using the CKD EPI equation. This calculation has not been validated in all clinical situations. eGFR's persistently <60 mL/min signify possible Chronic Kidney Disease.    Anion gap 8 5 - 15  Troponin I     Status: None   Collection Time: 10/30/14  7:40 PM  Result Value Ref  Range   Troponin I <0.03 <0.031 ng/mL    Comment:        NO INDICATION OF MYOCARDIAL INJURY.   Troponin I     Status: None   Collection Time: 10/31/14 12:53 AM  Result Value Ref Range   Troponin I <0.03 <0.031 ng/mL    Comment:        NO INDICATION OF MYOCARDIAL INJURY.   Basic metabolic panel     Status: Abnormal   Collection Time: 10/31/14  5:24 AM  Result Value Ref Range   Sodium 140 135 - 145 mmol/L   Potassium 4.4 3.5 - 5.1 mmol/L   Chloride 110 101 - 111 mmol/L   CO2 25 22 - 32 mmol/L   Glucose, Bld 129 (H) 65 - 99 mg/dL   BUN 40 (H) 6 - 20 mg/dL   Creatinine, Ser 1.22 0.61 - 1.24 mg/dL   Calcium 9.3 8.9 - 10.3 mg/dL   GFR calc non Af Amer >60 >60  mL/min   GFR calc Af Amer >60 >60 mL/min    Comment: (NOTE) The eGFR has been calculated using the CKD EPI equation. This calculation has not been validated in all clinical situations. eGFR's persistently <60 mL/min signify possible Chronic Kidney Disease.    Anion gap 5 5 - 15  CBC     Status: Abnormal   Collection Time: 10/31/14  5:24 AM  Result Value Ref Range   WBC 5.7 3.8 - 10.6 K/uL   RBC 3.73 (L) 4.40 - 5.90 MIL/uL   Hemoglobin 11.0 (L) 13.0 - 18.0 g/dL   HCT 32.7 (L) 40.0 - 52.0 %   MCV 87.9 80.0 - 100.0 fL   MCH 29.5 26.0 - 34.0 pg   MCHC 33.6 32.0 - 36.0 g/dL   RDW 13.7 11.5 - 14.5 %   Platelets 163 150 - 440 K/uL  Hemoglobin A1c     Status: Abnormal   Collection Time: 10/31/14  5:24 AM  Result Value Ref Range   Hgb A1c MFr Bld 7.6 (H) 4.0 - 6.0 %  Glucose, capillary     Status: Abnormal   Collection Time: 10/31/14  7:13 AM  Result Value Ref Range   Glucose-Capillary 111 (H) 65 - 99 mg/dL  Glucose, capillary     Status: Abnormal   Collection Time: 10/31/14 11:07 AM  Result Value Ref Range   Glucose-Capillary 136 (H) 65 - 99 mg/dL   Comment 1 Notify RN     PHQ2/9: Depression screen [facility] 2/9 12/06/2014 10/10/2014 07/30/2014  Decreased Interest 1 0 1  Down, Depressed, Hopeless 1 0 1  PHQ - 2 Score 2  0 2  Altered sleeping 0 - 3  Tired, decreased energy 1 - 2  Change in appetite 0 - 2  Feeling bad or failure about yourself  0 - 1  Trouble concentrating 0 - 1  Moving slowly or fidgety/restless 0 - 2  Suicidal thoughts 0 - 0  PHQ-9 Score 3 - 13  Difficult doing work/chores Somewhat difficult - Somewhat difficult     Fall Risk: Fall Risk  12/06/2014 10/10/2014 07/30/2014  Falls in the past year? No No No     Functional Status Survey: Is the patient deaf or have difficulty hearing?: No Does the patient have difficulty seeing, even when wearing glasses/contacts?: Yes (glasses) Does the patient have difficulty concentrating, remembering, or making decisions?: No Does the patient have difficulty walking or climbing stairs?: No Does the patient have difficulty dressing or bathing?: No Does the patient have difficulty doing errands alone such as visiting a doctor's office or shopping?: No    Assessment & Plan  1. Upper respiratory infection  Likely viral, on exam clear rhinorrhea, feeling better already, medication for symptoms control, fluids, rest, and call back if no improvement - benzonatate (TESSALON) 100 MG capsule; Take 1-2 capsules (100-200 mg total) by mouth 3 (three) times daily as needed for cough.  Dispense: 40 capsule; Refill: 0 - chlorpheniramine-HYDROcodone (TUSSIONEX PENNKINETIC ER) 10-8 MG/5ML SUER; Take 5 mLs by mouth every 12 (twelve) hours as needed for cough.  Dispense: 140 mL; Refill: 0

## 2014-12-11 ENCOUNTER — Encounter: Payer: Self-pay | Admitting: Family Medicine

## 2014-12-11 DIAGNOSIS — E113519 Type 2 diabetes mellitus with proliferative diabetic retinopathy with macular edema, unspecified eye: Secondary | ICD-10-CM | POA: Insufficient documentation

## 2014-12-11 DIAGNOSIS — E113419 Type 2 diabetes mellitus with severe nonproliferative diabetic retinopathy with macular edema, unspecified eye: Secondary | ICD-10-CM | POA: Insufficient documentation

## 2014-12-13 ENCOUNTER — Other Ambulatory Visit: Payer: Self-pay | Admitting: Cardiovascular Disease

## 2014-12-13 ENCOUNTER — Ambulatory Visit (INDEPENDENT_AMBULATORY_CARE_PROVIDER_SITE_OTHER): Payer: Medicaid Other | Admitting: Cardiovascular Disease

## 2014-12-13 ENCOUNTER — Encounter: Payer: Self-pay | Admitting: Cardiovascular Disease

## 2014-12-13 VITALS — BP 150/86 | HR 77 | Ht 67.0 in | Wt 234.5 lb

## 2014-12-13 DIAGNOSIS — I5022 Chronic systolic (congestive) heart failure: Secondary | ICD-10-CM | POA: Diagnosis not present

## 2014-12-13 DIAGNOSIS — I2510 Atherosclerotic heart disease of native coronary artery without angina pectoris: Secondary | ICD-10-CM | POA: Diagnosis not present

## 2014-12-13 DIAGNOSIS — E785 Hyperlipidemia, unspecified: Secondary | ICD-10-CM

## 2014-12-13 MED ORDER — CARVEDILOL 6.25 MG PO TABS: 6.2500 mg | ORAL_TABLET | Freq: Two times a day (BID) | ORAL | Status: DC

## 2014-12-13 NOTE — Patient Instructions (Signed)
Medication Instructions:  Your physician has recommended you make the following change in your medication:  INCREASE coreg to 6.25mg  twice per day   Labwork: none  Testing/Procedures: none  Follow-Up: Your physician recommends that you schedule a follow-up appointment in: three months with Dr. Kirke Corin.    Any Other Special Instructions Will Be Listed Below (If Applicable).     If you need a refill on your cardiac medications before your next appointment, please call your pharmacy.

## 2014-12-13 NOTE — Progress Notes (Signed)
Primary care physician: Dr. Carlynn Purl Nephrologist: Dr. Wynelle Link  HPI   59 year old male who is here today for a followup visit regarding coronary artery disease and chronic systolic heart failure. He was admitted to [facility] in September, 2015 with complaints of cough and chest pain.  He ruled out for myocardial infarction.  He underwent stress testing which revealed apical infarct with peri-infarct ischemia.  EF was 31%.  Echocardiogram showed EF 35-40% with anteroseptal, anterior, apical wall motion abnormalities. He underwent cardiac catheterization which revealed an occluded mid LAD with otherwise nonobstructive CAD.  There were faint right to left collaterals supplying the distal LAD distribution.   The patient was treated with heart failure medications which ultimately had to be stopped due to recurrent episodes of symptomatic hypotension. He had multiple admissions over the last few months with nausea, vomiting, hypotension and acute on chronic renal failure. 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 He has significant proteinuria and his diabetes has not been well controlled. He also started having diabetic retinopathy recently. Blood pressure continues to run high at home.   No Known Allergies   Current Outpatient Prescriptions on File Prior to Visit  Medication Sig Dispense Refill  . amLODipine (NORVASC) 5 MG tablet Take 1 tablet (5 mg total) by mouth daily. 30 tablet 6  . aspirin EC 81 MG tablet Take 81 mg by mouth daily.    . benzonatate (TESSALON) 100 MG capsule Take 1-2 capsules (100-200 mg total) by mouth 3 (three) times daily as needed for cough. 40 capsule 0  . carvedilol (COREG) 3.125 MG tablet Take 3.125 mg by mouth 2 (two) times daily with a meal.    . chlorpheniramine-HYDROcodone (TUSSIONEX PENNKINETIC ER) 10-8 MG/5ML SUER Take 5 mLs by mouth every 12 (twelve) hours as needed for cough. 140  mL 0  . CRESTOR 20 MG tablet TAKE 1 TABLET(20 MG) BY MOUTH AT BEDTIME 30 tablet 5  . Cyanocobalamin (VITAMIN B-12 PO) Take 1 tablet by mouth daily.    Marland Kitchen escitalopram (LEXAPRO) 10 MG tablet Take 1 tablet (10 mg total) by mouth daily. 90 tablet 1  . Insulin Degludec (TRESIBA FLEXTOUCH) 200 UNIT/ML SOPN Inject 8 Units into the skin daily. 5 pen 2  . nitroGLYCERIN (NITROSTAT) 0.4 MG SL tablet Place 0.4 mg under the tongue every 5 (five) minutes as needed for chest pain.    Marland Kitchen QUEtiapine (SEROQUEL) 100 MG tablet Take 1 tablet (100 mg total) by mouth at bedtime. 90 tablet 0  . rOPINIRole (REQUIP) 0.5 MG tablet Take 1 tablet (0.5 mg total) by mouth at bedtime. 30 tablet 5  . sodium bicarbonate 650 MG tablet Take 1 tablet (650 mg total) by mouth 2 (two) times daily. 60 tablet 0   No current facility-administered medications on file prior to visit.     Past Medical History  Diagnosis Date  . Diabetes mellitus without complication (HCC)   . Hypertension   . Hyperlipidemia   . Depression   . Rotator cuff tear   . CKD (chronic kidney disease), stage III   . Ischemic cardiomyopathy     a. 10/2013 Echo: EF 35-40%, severe distal anteroseptal, anterior, and apical HK. Diast dysfxn, mild conc LVH, mildly dil LA, mild Ao sclerosis w/o stenosis.  Marland Kitchen CAD (coronary artery disease)     a. 10/2013 Lexi MV: EF 31%, apical, septal, ant-apical, inf-apical, lat-apical scar, septal and apical peri-infarct ischemia;  b. 10/2013 Cath:  LM nl, LAD 20ost, 170m (faint R->L collats), D1 80p, LCX min irregs, OM1 min irregs, OM2 nl, OM3 30p, RCA 20p, PDA 60, RPL min irregs-->Med Rx.  . Morbid obesity (HCC)   . CHF (congestive heart failure) (HCC)   . OSA (obstructive sleep apnea)   . Sleep apnea     recent test confirmed diagnosis, was performed at the sleep center across the street  . Hypotension      Past Surgical History  Procedure Laterality Date  . Cardiac catheterization  10/19/2013    [facility]  . Uvulectomy  1995  .  Atrial fibrillation ablation  1995  . Elbow surgery Left   . Rotator cuff repair    . Carpal tunnel release       Family History  Problem Relation Age of Onset  . Stroke Mother   . COPD Mother   . Heart failure Mother   . Heart attack Mother   . Colon cancer Father   . Diabetes Father   . Diabetes Brother   . Cancer Brother      Social History   Social History  . Marital Status: Married    Spouse Name: Angelique Blonder  . Number of Children: 2  . Years of Education: N/A   Occupational History  . Not on file.   Social History Main Topics  . Smoking status: Never Smoker   . Smokeless tobacco: Never Used  . Alcohol Use: No  . Drug Use: No  . Sexual Activity: No   Other Topics Concern  . Not on file   Social History Narrative     PHYSICAL EXAM   BP 150/86 mmHg  Pulse 77  Ht 5\' 7"  (1.702 m)  Wt 234 lb 8 oz (106.369 kg)  BMI 36.72 kg/m2 Constitutional: He is oriented to person, place, and time. He appears well-developed and well-nourished. No distress.  HENT: No nasal discharge.  Head: Normocephalic and atraumatic.  Eyes: Pupils are equal and round.  No discharge. Neck: Normal range of motion. Neck supple. No JVD present. No thyromegaly present.  Cardiovascular: Normal rate, regular rhythm, normal heart sounds. Exam reveals no gallop and no friction rub. No murmur heard.  Pulmonary/Chest: Effort normal and breath sounds normal. No stridor. No respiratory distress. He has no wheezes. He has no rales. He exhibits no tenderness.  Abdominal: Soft. Bowel sounds are normal. He exhibits no distension. There is no tenderness. There is no rebound and no guarding.  Musculoskeletal: Normal range of motion. He exhibits no edema and no tenderness.  Neurological: He is alert and oriented to person, place, and time. Coordination normal.  Skin: Skin is warm and dry. No rash noted. He is not diaphoretic. No erythema. No pallor.  Psychiatric: He has a normal mood and affect. His  behavior is normal. Judgment and thought content normal.      ASSESSMENT AND PLAN

## 2014-12-15 NOTE — Assessment & Plan Note (Signed)
He has no symptoms of angina. Continue medical therapy. 

## 2014-12-15 NOTE — Assessment & Plan Note (Signed)
Most recent ejection fraction improved to normal. He appears to be euvolemic. Blood pressure continues to be elevated. Thus, I increased the dose of carvedilol to 6.25 mg twice daily. He had recurrent episodes of acute on chronic renal failure and thus he is currently not an ACE inhibitor or ARB.

## 2014-12-15 NOTE — Assessment & Plan Note (Signed)
Continue treatment with rosuvastatin with a target LDL of less than 70. 

## 2014-12-18 ENCOUNTER — Encounter: Payer: Self-pay | Admitting: Family Medicine

## 2015-01-01 ENCOUNTER — Other Ambulatory Visit: Payer: Self-pay | Admitting: Family Medicine

## 2015-01-02 NOTE — Telephone Encounter (Signed)
Patient requesting refill. 

## 2015-01-08 ENCOUNTER — Other Ambulatory Visit: Payer: Self-pay

## 2015-01-08 MED ORDER — QUETIAPINE FUMARATE 100 MG PO TABS: ORAL_TABLET | ORAL | Status: DC

## 2015-01-09 ENCOUNTER — Other Ambulatory Visit: Payer: Self-pay

## 2015-01-10 MED ORDER — QUETIAPINE FUMARATE 100 MG PO TABS: ORAL_TABLET | ORAL | Status: DC

## 2015-01-16 ENCOUNTER — Inpatient Hospital Stay
Admission: EM | Admit: 2015-01-16 | Discharge: 2015-01-17 | DRG: 684 | Disposition: A | Discharge status: Home / Self Care | Payer: Medicaid Other | Attending: Specialist | Admitting: Specialist

## 2015-01-16 ENCOUNTER — Emergency Department: Payer: Medicaid Other

## 2015-01-16 ENCOUNTER — Encounter: Payer: Self-pay | Admitting: Medical Oncology

## 2015-01-16 DIAGNOSIS — R0602 Shortness of breath: Secondary | ICD-10-CM | POA: Diagnosis not present

## 2015-01-16 DIAGNOSIS — E785 Hyperlipidemia, unspecified: Secondary | ICD-10-CM | POA: Diagnosis present

## 2015-01-16 DIAGNOSIS — N179 Acute kidney failure, unspecified: Secondary | ICD-10-CM | POA: Diagnosis not present

## 2015-01-16 DIAGNOSIS — E1122 Type 2 diabetes mellitus with diabetic chronic kidney disease: Secondary | ICD-10-CM

## 2015-01-16 DIAGNOSIS — I959 Hypotension, unspecified: Secondary | ICD-10-CM | POA: Diagnosis present

## 2015-01-16 DIAGNOSIS — J209 Acute bronchitis, unspecified: Secondary | ICD-10-CM | POA: Diagnosis present

## 2015-01-16 DIAGNOSIS — I129 Hypertensive chronic kidney disease with stage 1 through stage 4 chronic kidney disease, or unspecified chronic kidney disease: Secondary | ICD-10-CM | POA: Diagnosis present

## 2015-01-16 DIAGNOSIS — E860 Dehydration: Secondary | ICD-10-CM | POA: Diagnosis present

## 2015-01-16 DIAGNOSIS — Z7982 Long term (current) use of aspirin: Secondary | ICD-10-CM | POA: Diagnosis not present

## 2015-01-16 DIAGNOSIS — Z800 Family history of malignant neoplasm of digestive organs: Secondary | ICD-10-CM

## 2015-01-16 DIAGNOSIS — Z833 Family history of diabetes mellitus: Secondary | ICD-10-CM

## 2015-01-16 DIAGNOSIS — E875 Hyperkalemia: Secondary | ICD-10-CM | POA: Diagnosis present

## 2015-01-16 DIAGNOSIS — I509 Heart failure, unspecified: Secondary | ICD-10-CM | POA: Diagnosis present

## 2015-01-16 DIAGNOSIS — Z825 Family history of asthma and other chronic lower respiratory diseases: Secondary | ICD-10-CM | POA: Diagnosis not present

## 2015-01-16 DIAGNOSIS — G4733 Obstructive sleep apnea (adult) (pediatric): Secondary | ICD-10-CM | POA: Diagnosis present

## 2015-01-16 DIAGNOSIS — Z8249 Family history of ischemic heart disease and other diseases of the circulatory system: Secondary | ICD-10-CM

## 2015-01-16 DIAGNOSIS — Z9889 Other specified postprocedural states: Secondary | ICD-10-CM | POA: Diagnosis not present

## 2015-01-16 DIAGNOSIS — N19 Unspecified kidney failure: Secondary | ICD-10-CM

## 2015-01-16 DIAGNOSIS — I255 Ischemic cardiomyopathy: Secondary | ICD-10-CM | POA: Diagnosis present

## 2015-01-16 DIAGNOSIS — N189 Chronic kidney disease, unspecified: Secondary | ICD-10-CM

## 2015-01-16 DIAGNOSIS — N183 Chronic kidney disease, stage 3 (moderate): Secondary | ICD-10-CM | POA: Diagnosis present

## 2015-01-16 DIAGNOSIS — Z794 Long term (current) use of insulin: Secondary | ICD-10-CM

## 2015-01-16 DIAGNOSIS — Z823 Family history of stroke: Secondary | ICD-10-CM

## 2015-01-16 DIAGNOSIS — Z79899 Other long term (current) drug therapy: Secondary | ICD-10-CM

## 2015-01-16 DIAGNOSIS — E1165 Type 2 diabetes mellitus with hyperglycemia: Secondary | ICD-10-CM

## 2015-01-16 DIAGNOSIS — I2510 Atherosclerotic heart disease of native coronary artery without angina pectoris: Secondary | ICD-10-CM | POA: Diagnosis present

## 2015-01-16 DIAGNOSIS — IMO0002 Reserved for concepts with insufficient information to code with codable children: Secondary | ICD-10-CM

## 2015-01-16 LAB — CBC
HCT: 31.7 % — ABNORMAL LOW (ref 40.0–52.0)
Hemoglobin: 10.7 g/dL — ABNORMAL LOW (ref 13.0–18.0)
MCH: 29.4 pg (ref 26.0–34.0)
MCHC: 33.7 g/dL (ref 32.0–36.0)
MCV: 87.4 fL (ref 80.0–100.0)
PLATELETS: 178 10*3/uL (ref 150–440)
RBC: 3.63 MIL/uL — AB (ref 4.40–5.90)
RDW: 16.8 % — ABNORMAL HIGH (ref 11.5–14.5)
WBC: 6.7 10*3/uL (ref 3.8–10.6)

## 2015-01-16 LAB — BASIC METABOLIC PANEL
Anion gap: 7 (ref 5–15)
BUN: 32 mg/dL — AB (ref 6–20)
CALCIUM: 9.5 mg/dL (ref 8.9–10.3)
CHLORIDE: 108 mmol/L (ref 101–111)
CO2: 24 mmol/L (ref 22–32)
CREATININE: 2.17 mg/dL — AB (ref 0.61–1.24)
GFR calc non Af Amer: 32 mL/min — ABNORMAL LOW (ref >60)
GFR, EST AFRICAN AMERICAN: 37 mL/min — AB (ref >60)
GLUCOSE: 193 mg/dL — AB (ref 65–99)
Potassium: 5.6 mmol/L — ABNORMAL HIGH (ref 3.5–5.1)
Sodium: 139 mmol/L (ref 135–145)

## 2015-01-16 LAB — GLUCOSE, CAPILLARY: Glucose-Capillary: 111 mg/dL — ABNORMAL HIGH (ref 65–99)

## 2015-01-16 LAB — FIBRIN DERIVATIVES D-DIMER (ARMC ONLY): FIBRIN DERIVATIVES D-DIMER (ARMC): 879 — AB (ref 0–499)

## 2015-01-16 LAB — TROPONIN I: Troponin I: <0.03 ng/mL (ref <0.031)

## 2015-01-16 LAB — BRAIN NATRIURETIC PEPTIDE: B NATRIURETIC PEPTIDE 5: 203 pg/mL — AB (ref 0.0–100.0)

## 2015-01-16 MED ORDER — SODIUM CHLORIDE 0.9 % IV SOLN
Freq: Once | INTRAVENOUS | Status: AC
  Administered 2015-01-16: 20:00:00 via INTRAVENOUS

## 2015-01-16 MED ORDER — SODIUM POLYSTYRENE SULFONATE 15 GM/60ML PO SUSP
30.0000 g | Freq: Once | ORAL | Status: AC
  Administered 2015-01-16: 30 g via ORAL
  Filled 2015-01-16: qty 120

## 2015-01-16 MED ORDER — QUETIAPINE FUMARATE 100 MG PO TABS
100.0000 mg | ORAL_TABLET | Freq: Every day | ORAL | Status: DC
  Administered 2015-01-16: 100 mg via ORAL
  Filled 2015-01-16: qty 1

## 2015-01-16 MED ORDER — CARVEDILOL 6.25 MG PO TABS
6.2500 mg | ORAL_TABLET | Freq: Two times a day (BID) | ORAL | Status: DC
  Administered 2015-01-17: 6.25 mg via ORAL
  Filled 2015-01-16: qty 1

## 2015-01-16 MED ORDER — HYDROCOD POLST-CPM POLST ER 10-8 MG/5ML PO SUER
5.0000 mL | Freq: Two times a day (BID) | ORAL | Status: DC | PRN
  Administered 2015-01-16: 5 mL via ORAL
  Filled 2015-01-16 (×2): qty 5

## 2015-01-16 MED ORDER — INSULIN ASPART 100 UNIT/ML ~~LOC~~ SOLN: 0.0000 [IU] | Freq: Three times a day (TID) | SUBCUTANEOUS | Status: DC

## 2015-01-16 MED ORDER — ROPINIROLE HCL 1 MG PO TABS
0.5000 mg | ORAL_TABLET | Freq: Every day | ORAL | Status: DC
  Administered 2015-01-16: 0.5 mg via ORAL
  Filled 2015-01-16: qty 1

## 2015-01-16 MED ORDER — SODIUM BICARBONATE 650 MG PO TABS
650.0000 mg | ORAL_TABLET | Freq: Two times a day (BID) | ORAL | Status: DC
  Administered 2015-01-16 – 2015-01-17 (×2): 650 mg via ORAL
  Filled 2015-01-16 (×2): qty 1

## 2015-01-16 MED ORDER — AMLODIPINE BESYLATE 5 MG PO TABS
5.0000 mg | ORAL_TABLET | Freq: Every day | ORAL | Status: DC
  Administered 2015-01-16 – 2015-01-17 (×2): 5 mg via ORAL
  Filled 2015-01-16 (×2): qty 1

## 2015-01-16 MED ORDER — IPRATROPIUM-ALBUTEROL 0.5-2.5 (3) MG/3ML IN SOLN
3.0000 mL | Freq: Once | RESPIRATORY_TRACT | Status: AC
  Administered 2015-01-16: 3 mL via RESPIRATORY_TRACT
  Filled 2015-01-16: qty 3

## 2015-01-16 MED ORDER — ESCITALOPRAM OXALATE 10 MG PO TABS
10.0000 mg | ORAL_TABLET | Freq: Every day | ORAL | Status: DC
  Administered 2015-01-16 – 2015-01-17 (×2): 10 mg via ORAL
  Filled 2015-01-16 (×2): qty 1

## 2015-01-16 MED ORDER — VITAMIN B-12 100 MCG PO TABS
100.0000 ug | ORAL_TABLET | Freq: Every day | ORAL | Status: DC
  Administered 2015-01-17: 100 ug via ORAL
  Filled 2015-01-16: qty 1

## 2015-01-16 MED ORDER — ASPIRIN EC 81 MG PO TBEC
81.0000 mg | DELAYED_RELEASE_TABLET | Freq: Every day | ORAL | Status: DC
  Administered 2015-01-16 – 2015-01-17 (×2): 81 mg via ORAL
  Filled 2015-01-16 (×2): qty 1

## 2015-01-16 MED ORDER — AZITHROMYCIN 250 MG PO TABS
250.0000 mg | ORAL_TABLET | Freq: Every day | ORAL | Status: DC
  Administered 2015-01-16 – 2015-01-17 (×2): 250 mg via ORAL
  Filled 2015-01-16 (×2): qty 1

## 2015-01-16 MED ORDER — HEPARIN SODIUM (PORCINE) 5000 UNIT/ML IJ SOLN
5000.0000 [IU] | Freq: Three times a day (TID) | INTRAMUSCULAR | Status: DC
  Administered 2015-01-16 – 2015-01-17 (×2): 5000 [IU] via SUBCUTANEOUS
  Filled 2015-01-16 (×2): qty 1

## 2015-01-16 MED ORDER — INSULIN ASPART 100 UNIT/ML ~~LOC~~ SOLN
0.0000 [IU] | Freq: Three times a day (TID) | SUBCUTANEOUS | Status: DC
  Administered 2015-01-17: 2 [IU] via SUBCUTANEOUS
  Filled 2015-01-16: qty 2

## 2015-01-16 MED ORDER — SODIUM CHLORIDE 0.9 % IJ SOLN: 3.0000 mL | INTRAMUSCULAR | Status: DC | PRN

## 2015-01-16 MED ORDER — SODIUM CHLORIDE 0.9 % IV SOLN
INTRAVENOUS | Status: DC
  Administered 2015-01-16 – 2015-01-17 (×2): via INTRAVENOUS

## 2015-01-16 MED ORDER — BENZONATATE 100 MG PO CAPS
100.0000 mg | ORAL_CAPSULE | Freq: Three times a day (TID) | ORAL | Status: DC | PRN
  Administered 2015-01-16: 200 mg via ORAL
  Filled 2015-01-16: qty 2

## 2015-01-16 MED ORDER — ROSUVASTATIN CALCIUM 20 MG PO TABS: 20.0000 mg | ORAL_TABLET | Freq: Every day | ORAL | Status: DC

## 2015-01-16 NOTE — ED Notes (Signed)
Pt states he has had cough X 2 weeks. Seen by PCP and put on "little gold pills for coughing". Pt alert and oriented X4, active, cooperative, pt in NAD. RR even and unlabored, color WNL.

## 2015-01-16 NOTE — ED Provider Notes (Signed)
[facility] Emergency Department Provider Note  ____________________________________________  Time seen: Approximately 5:20 PM  I have reviewed the triage vital signs and the nursing notes.   HISTORY  Chief Complaint Cough    HPI Angel Cruz is a 59 y.o. male patient reports she's had a cough for the last 2-3 weeks. He went to see his doctor cut some gold pills that helped with the cough but then on Monday he had some vomiting and yesterday and the day before yesterday and today he coughed a lot worse and has chest pain now and headache every time he coughs. Cough is nonproductive. Patient has no fever. Patient does not have pain with deep breathing. Patient has no swelling in his legs.Patient is not short of breath.   Past Medical History  Diagnosis Date  . Diabetes mellitus without complication (HCC)   . Hypertension   . Hyperlipidemia   . Depression   . Rotator cuff tear   . CKD (chronic kidney disease), stage III   . Ischemic cardiomyopathy     a. 10/2013 Echo: EF 35-40%, severe distal anteroseptal, anterior, and apical HK. Diast dysfxn, mild conc LVH, mildly dil LA, mild Ao sclerosis w/o stenosis.  Marland Kitchen CAD (coronary artery disease)     a. 10/2013 Lexi MV: EF 31%, apical, septal, ant-apical, inf-apical, lat-apical scar, septal and apical peri-infarct ischemia;  b. 10/2013 Cath: LM nl, LAD 20ost, 136m (faint R->L collats), D1 80p, LCX min irregs, OM1 min irregs, OM2 nl, OM3 30p, RCA 20p, PDA 60, RPL min irregs-->Med Rx.  . Morbid obesity (HCC)   . CHF (congestive heart failure) (HCC)   . OSA (obstructive sleep apnea)   . Sleep apnea     recent test confirmed diagnosis, was performed at the sleep center across the street  . Hypotension     Patient Active Problem List   Diagnosis Date Noted  . Acute on chronic renal failure (HCC) 01/16/2015  . Hyperkalemia 01/16/2015  . 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 Insomnia 11/12/2014  . Uncontrolled hypertension 10/31/2014  . Essential hypertension, malignant 10/31/2014  . OSA (obstructive sleep apnea) 09/28/2014  . Anemia 09/14/2014  . Cardiomyopathy, ischemic   . Orthostatic hypotension 09/07/2014  . Angioedema 07/30/2014  . RLS (restless legs syndrome) 07/30/2014  . B12 deficiency 07/30/2014  . Vitamin D deficiency 07/30/2014  . Chronic systolic heart failure (HCC) 11/14/2013  . Ischemic cardiomyopathy   . CAD (coronary artery disease)   . Hyperlipidemia   . Uncontrolled diabetes mellitus with stage 3 chronic kidney disease, with long-term current use of insulin (HCC)   . Major depression, recurrent (HCC)   . CKD (chronic kidney disease), stage III     Past Surgical History  Procedure Laterality Date  . Cardiac catheterization  10/19/2013    [facility]  . Uvulectomy  1995  . Atrial fibrillation ablation  1995  . Elbow surgery Left   . Rotator cuff repair    . Carpal tunnel release      Current Outpatient Rx  Name  Route  Sig  Dispense  Refill  . amLODipine (NORVASC) 5 MG tablet   Oral   Take 1 tablet (5 mg total) by mouth daily.   30 tablet   6   . aspirin EC 81 MG tablet   Oral  Take 81 mg by mouth daily.         . benzonatate (TESSALON) 100 MG capsule   Oral   Take 1-2 capsules (100-200 mg total) by mouth 3 (three) times daily as needed for cough.   40 capsule   0   . carvedilol (COREG) 6.25 MG tablet      TAKE 1 TABLET(6.25 MG) BY MOUTH TWICE DAILY WITH A MEAL   180 tablet   3     **Patient requests 90 days supply**   . CRESTOR 20 MG tablet      TAKE 1 TABLET(20 MG) BY MOUTH AT BEDTIME   30 tablet   5   . Cyanocobalamin (VITAMIN B-12 PO)   Oral   Take 1 tablet by mouth daily.         Marland Kitchen escitalopram (LEXAPRO) 10 MG tablet    Oral   Take 1 tablet (10 mg total) by mouth daily.   90 tablet   1   . Insulin Degludec (TRESIBA FLEXTOUCH) 200 UNIT/ML SOPN   Subcutaneous   Inject 8 Units into the skin daily. Patient taking differently: Inject 3 Units into the skin daily.    5 pen   2   . nitroGLYCERIN (NITROSTAT) 0.4 MG SL tablet   Sublingual   Place 0.4 mg under the tongue every 5 (five) minutes as needed for chest pain.         Marland Kitchen QUEtiapine (SEROQUEL) 100 MG tablet      TAKE 1 TABLET(100 MG) BY MOUTH AT BEDTIME   90 tablet   0   . rOPINIRole (REQUIP) 0.5 MG tablet   Oral   Take 1 tablet (0.5 mg total) by mouth at bedtime.   30 tablet   5   . sodium bicarbonate 650 MG tablet   Oral   Take 1 tablet (650 mg total) by mouth 2 (two) times daily.   60 tablet   0     Allergies Review of patient's allergies indicates no known allergies.  Family History  Problem Relation Age of Onset  . Stroke Mother   . COPD Mother   . Heart failure Mother   . Heart attack Mother   . Colon cancer Father   . Diabetes Father   . Diabetes Brother   . Cancer Brother     Social History Social History  Substance Use Topics  . Smoking status: Never Smoker   . Smokeless tobacco: Never Used  . Alcohol Use: No    Review of Systems Constitutional: No fever/chills Eyes: No visual changes. ENT: No sore throat. Cardiovascular: See history of present illness Respiratory: See history of present illness Gastrointestinal: No abdominal pain.  No nausea, no vomiting.  No diarrhea.  No constipation. Genitourinary: Negative for dysuria. Musculoskeletal: Negative for back pain. Skin: Negative for rash. Neurological: Negative for, focal weakness or numbness.  10-point ROS otherwise negative.  ____________________________________________   PHYSICAL EXAM:  VITAL SIGNS: ED Triage Vitals  Enc Vitals Group     BP 01/16/15 1545 117/68 mmHg     Pulse Rate 01/16/15 1545 73     Resp 01/16/15 1545 20     Temp  01/16/15 1545 98.3 F (36.8 C)     Temp Source 01/16/15 1545 Oral     SpO2 01/16/15 1545 97 %     Weight 01/16/15 1545 229 lb (103.874 kg)     Height 01/16/15 1545  (1.702 m)     Head Cir --  Peak Flow --      Pain Score --      Pain Loc --      Pain Edu? --      Excl. in GC? --     Constitutional: Alert and oriented. Well appearing and in no acute distress. Eyes: Conjunctivae are normal. PERRL. EOMI. Head: Atraumatic. Nose: No congestion/rhinnorhea. Mouth/Throat: Mucous membranes are moist.  Oropharynx non-erythematous. Neck: No stridor.  Cardiovascular: Normal rate, regular rhythm. Grossly normal heart sounds.  Good peripheral circulation. Respiratory: Normal respiratory effort.  No retractions. Lungs CTAB. Gastrointestinal: Soft and nontender. No distention. No abdominal bruits. No CVA tenderness. Musculoskeletal: No lower extremity tenderness nor edema.  No joint effusions. Neurologic:  Normal speech and language. No gross focal neurologic deficits are appreciated.  Skin:  Skin is warm, dry and intact. No rash noted.   ____________________________________________   LABS (all labs ordered are listed, but only abnormal results are displayed)  Labs Reviewed  BASIC METABOLIC PANEL - Abnormal; Notable for the following:    Potassium 5.6 (*)    Glucose, Bld 193 (*)    BUN 32 (*)    Creatinine, Ser 2.17 (*)    GFR calc non Af Amer 32 (*)    GFR calc Af Amer 37 (*)    All other components within normal limits  CBC - Abnormal; Notable for the following:    RBC 3.63 (*)    Hemoglobin 10.7 (*)    HCT 31.7 (*)    RDW 16.8 (*)    All other components within normal limits  FIBRIN DERIVATIVES D-DIMER ([facility] ONLY) - Abnormal; Notable for the following:    Fibrin derivatives D-dimer ([facility]) 879 (*)    All other components within normal limits  BRAIN NATRIURETIC PEPTIDE - Abnormal; Notable for the following:    B Natriuretic Peptide 203.0 (*)    All other components  within normal limits  TROPONIN I  BASIC METABOLIC PANEL  CBC  CREATININE, SERUM   ____________________________________________  EKG  EKG read and interpreted by me shows normal sinus rhythm at a rate of 70 left axis computer is reading prolonged QT interval QTC is measured by the computer as 483 ms. There are flipped T waves in one L and V5 and V6 which are new since this past September. ____________________________________________  RADIOLOGY  Radiology cannot do the CT angiogram to rule out PE because of the creatinine ____________________________________________   PROCEDURES  We will plan to admit patient for IV hydration to see if this influences creatinine. We will try VQ scan as soon as possible to check on the possibility of his cough being caused by pulmonary embolus since his d-dimer is high  ____________________________________________   INITIAL IMPRESSION / ASSESSMENT AND PLAN / ED COURSE  Pertinent labs & imaging results that were available during my care of the patient were reviewed by me and considered in my medical decision making (see chart for details).   ____________________________________________   FINAL CLINICAL IMPRESSION(S) / ED DIAGNOSES  Final diagnoses:  Shortness of breath  Renal failure      Arnaldo Natal, MD 01/16/15 2148

## 2015-01-16 NOTE — ED Notes (Signed)
Pt reports non productive cough x 3 weeks, occasional sob and headache. Denies fever or pain at this time.

## 2015-01-16 NOTE — ED Notes (Signed)
Pt. Going up to floor with wife.

## 2015-01-16 NOTE — ED Notes (Signed)
MD at bedside. 

## 2015-01-16 NOTE — H&P (Signed)
[facility] Physicians - [place] at [facility]   PATIENT NAME: Angel Cruz    MR#:  [id number]  DATE OF BIRTH:  1955-06-26  DATE OF ADMISSION:  01/16/2015  PRIMARY CARE PHYSICIAN: Ruel Favors, MD   REQUESTING/REFERRING PHYSICIAN: Dorothea Glassman  CHIEF COMPLAINT:   Chief Complaint  Patient presents with  . Cough    HISTORY OF PRESENT ILLNESS: Detric Scalisi  is a 59 y.o. male with a known history of diabetes, hypertension, hyperlipidemia, see daily, ischemic cardiomyopathy ejection fraction 35-40%, coronary artery disease, morbid obesity, sleep apnea, hypotension in the past- had upper respiratory symptoms and cough 3 weeks ago and he was given some antibiotics by primary care which he finished and was feeling better but for last 3 days he started coughing again. He denies any associated sputum production or fever. He denies any chest pain. He denies decreased oral intake. In ER he was noted to have worsening in the renal function and his d-dimer was elevated with his potassium elevated. So he is given his admission to hospitalist team.  PAST MEDICAL HISTORY:   Past Medical History  Diagnosis Date  . Diabetes mellitus without complication (HCC)   . Hypertension   . Hyperlipidemia   . Depression   . Rotator cuff tear   . CKD (chronic kidney disease), stage III   . Ischemic cardiomyopathy     a. 10/2013 Echo: EF 35-40%, severe distal anteroseptal, anterior, and apical HK. Diast dysfxn, mild conc LVH, mildly dil LA, mild Ao sclerosis w/o stenosis.  Marland Kitchen CAD (coronary artery disease)     a. 10/2013 Lexi MV: EF 31%, apical, septal, ant-apical, inf-apical, lat-apical scar, septal and apical peri-infarct ischemia;  b. 10/2013 Cath: LM nl, LAD 20ost, 149m (faint R->L collats), D1 80p, LCX min irregs, OM1 min irregs, OM2 nl, OM3 30p, RCA 20p, PDA 60, RPL min irregs-->Med Rx.  . Morbid obesity (HCC)   . CHF (congestive heart failure) (HCC)   . OSA (obstructive sleep apnea)   .  Sleep apnea     recent test confirmed diagnosis, was performed at the sleep center across the street  . Hypotension     PAST SURGICAL HISTORY:  Past Surgical History  Procedure Laterality Date  . Cardiac catheterization  10/19/2013    [facility]  . Uvulectomy  1995  . Atrial fibrillation ablation  1995  . Elbow surgery Left   . Rotator cuff repair    . Carpal tunnel release      SOCIAL HISTORY:  Social History  Substance Use Topics  . Smoking status: Never Smoker   . Smokeless tobacco: Never Used  . Alcohol Use: No    FAMILY HISTORY:  Family History  Problem Relation Age of Onset  . Stroke Mother   . COPD Mother   . Heart failure Mother   . Heart attack Mother   . Colon cancer Father   . Diabetes Father   . Diabetes Brother   . Cancer Brother     DRUG ALLERGIES: No Known Allergies  REVIEW OF SYSTEMS:   CONSTITUTIONAL: No fever, fatigue or weakness.  EYES: No blurred or double vision.  EARS, NOSE, AND THROAT: No tinnitus or ear pain.  RESPIRATORY: Positive for cough, no shortness of breath, wheezing or hemoptysis.  CARDIOVASCULAR: No chest pain, orthopnea, edema.  GASTROINTESTINAL: No nausea, vomiting, diarrhea or abdominal pain.  GENITOURINARY: No dysuria, hematuria.  ENDOCRINE: No polyuria, nocturia,  HEMATOLOGY: No anemia, easy bruising or bleeding SKIN: No rash or  lesion. MUSCULOSKELETAL: No joint pain or arthritis.   NEUROLOGIC: No tingling, numbness, weakness.  PSYCHIATRY: No anxiety or depression.   MEDICATIONS AT HOME:  Prior to Admission medications   Medication Sig Start Date End Date Taking? Authorizing Provider  amLODipine (NORVASC) 5 MG tablet Take 1 tablet (5 mg total) by mouth daily. 10/31/14   Katharina Caper, MD  aspirin EC 81 MG tablet Take 81 mg by mouth daily.    Historical Provider, MD  benzonatate (TESSALON) 100 MG capsule Take 1-2 capsules (100-200 mg total) by mouth 3 (three) times daily as needed for cough. 12/06/14   Alba Cory, MD   carvedilol (COREG) 6.25 MG tablet TAKE 1 TABLET(6.25 MG) BY MOUTH TWICE DAILY WITH A MEAL 12/13/14   Iran Ouch, MD  chlorpheniramine-HYDROcodone (TUSSIONEX PENNKINETIC ER) 10-8 MG/5ML SUER Take 5 mLs by mouth every 12 (twelve) hours as needed for cough. 12/06/14   Alba Cory, MD  CRESTOR 20 MG tablet TAKE 1 TABLET(20 MG) BY MOUTH AT BEDTIME 10/22/14   Alba Cory, MD  Cyanocobalamin (VITAMIN B-12 PO) Take 1 tablet by mouth daily.    Historical Provider, MD  escitalopram (LEXAPRO) 10 MG tablet Take 1 tablet (10 mg total) by mouth daily. 10/10/14   Alba Cory, MD  Insulin Degludec (TRESIBA FLEXTOUCH) 200 UNIT/ML SOPN Inject 8 Units into the skin daily. 11/12/14   Alba Cory, MD  nitroGLYCERIN (NITROSTAT) 0.4 MG SL tablet Place 0.4 mg under the tongue every 5 (five) minutes as needed for chest pain.    Historical Provider, MD  QUEtiapine (SEROQUEL) 100 MG tablet TAKE 1 TABLET(100 MG) BY MOUTH AT BEDTIME 01/10/15   Alba Cory, MD  rOPINIRole (REQUIP) 0.5 MG tablet Take 1 tablet (0.5 mg total) by mouth at bedtime. 07/30/14   Alba Cory, MD  sodium bicarbonate 650 MG tablet Take 1 tablet (650 mg total) by mouth 2 (two) times daily. 11/27/14   Alba Cory, MD      PHYSICAL EXAMINATION:   VITAL SIGNS: Blood pressure 169/83, pulse 72, temperature 98.3 F (36.8 C), temperature source Oral, resp. rate 20, height  (1.702 m), weight 103.874 kg (229 lb), SpO2 100 %.  GENERAL:  59 y.o.-year-old patient lying in the bed with no acute distress.  EYES: Pupils equal, round, reactive to light and accommodation. No scleral icterus. Extraocular muscles intact.  HEENT: Head atraumatic, normocephalic. Oropharynx and nasopharynx clear.  NECK:  Supple, no jugular venous distention. No thyroid enlargement, no tenderness.  LUNGS: Normal breath sounds bilaterally, no wheezing, mild crepitation. No use of accessory muscles of respiration.  CARDIOVASCULAR: S1, S2 normal. No murmurs, rubs,  or gallops.  ABDOMEN: Soft, nontender, nondistended. Bowel sounds present. No organomegaly or mass.  EXTREMITIES: No pedal edema, cyanosis, or clubbing.  NEUROLOGIC: Cranial nerves II through XII are intact. Muscle strength 5/5 in all extremities. Sensation intact. Gait not checked.  PSYCHIATRIC: The patient is alert and oriented x 3.  SKIN: No obvious rash, lesion, or ulcer.   LABORATORY PANEL:   CBC  Recent Labs Lab 01/16/15 1607  WBC 6.7  HGB 10.7*  HCT 31.7*  PLT 178  MCV 87.4  MCH 29.4  MCHC 33.7  RDW 16.8*   ------------------------------------------------------------------------------------------------------------------  Chemistries   Recent Labs Lab 01/16/15 1607  NA 139  K 5.6*  CL 108  CO2 24  GLUCOSE 193*  BUN 32*  CREATININE 2.17*  CALCIUM 9.5   ------------------------------------------------------------------------------------------------------------------ estimated creatinine clearance is 43.1 mL/min (by C-G formula based on Cr of 2.17). ------------------------------------------------------------------------------------------------------------------  No results for input(s): TSH, T4TOTAL, T3FREE, THYROIDAB in the last 72 hours.  Invalid input(s): FREET3   Coagulation profile No results for input(s): INR, PROTIME in the last 168 hours. ------------------------------------------------------------------------------------------------------------------- No results for input(s): DDIMER in the last 72 hours. -------------------------------------------------------------------------------------------------------------------  Cardiac Enzymes  Recent Labs Lab 01/16/15 1607  TROPONINI <0.03   ------------------------------------------------------------------------------------------------------------------ Invalid input(s):  POCBNP  ---------------------------------------------------------------------------------------------------------------  Urinalysis    Component Value Date/Time   COLORURINE YELLOW* 09/24/2014 2150   COLORURINE Straw 02/11/2014 1642   APPEARANCEUR CLOUDY* 09/24/2014 2150   APPEARANCEUR Clear 02/11/2014 1642   LABSPEC 1.018 09/24/2014 2150   LABSPEC 1.009 02/11/2014 1642   PHURINE 5.0 09/24/2014 2150   PHURINE 5.0 02/11/2014 1642   GLUCOSEU NEGATIVE 09/24/2014 2150   GLUCOSEU Negative 02/11/2014 1642   HGBUR 1+* 09/24/2014 2150   HGBUR Negative 02/11/2014 1642   BILIRUBINUR NEGATIVE 09/24/2014 2150   BILIRUBINUR Negative 02/11/2014 1642   KETONESUR NEGATIVE 09/24/2014 2150   KETONESUR Negative 02/11/2014 1642   PROTEINUR 100* 09/24/2014 2150   PROTEINUR 30 mg/dL 52/09/221 3612   NITRITE NEGATIVE 09/24/2014 2150   NITRITE Negative 02/11/2014 1642   LEUKOCYTESUR NEGATIVE 09/24/2014 2150   LEUKOCYTESUR Negative 02/11/2014 1642     RADIOLOGY: Dg Chest 2 View  01/16/2015  CLINICAL DATA:  Cough for 2 weeks, increased in the last 2 days. Shortness of breath. EXAM: CHEST  2 VIEW COMPARISON:  08/15/2014 FINDINGS: The heart size and mediastinal contours are within normal limits. Both lungs are clear. The visualized skeletal structures are unremarkable. IMPRESSION: No active cardiopulmonary disease. Electronically Signed   By: Charlett Nose M.D.   On: 01/16/2015 16:34    IMPRESSION AND PLAN:  * Acute on chronic renal failure  We'll give IV fluid and monitor renal function.  If does not improve enough may need nephrology consult.  * Hyperkalemia  Give Kayexalate oral and follow tomorrow.  No EKG changes today.  * Elevated d-dimer  Currently we can not to CT angiogram for PE.  Immunizations renal function improves tomorrow we can do it tomorrow or already ordered a V/Q scan for tomorrow.  I will not start on any anti-coagulation at this time but once we confirm we'll have to  decide about that.  * Coronary artery disease  Continue cardiac medications. Hold ACE inhibitor.  * Hypertension  Continue home medication except ACE inhibitor.  * Congestive heart failure  Chronic condition, no active acute findings  Continue monitoring with IV fluids.  * Diabetes  Insulin sliding scale coverage.  * Cough  Likely acute bronchitis  Azithromycin for now. May need CT chest if does not improve.  All the records are reviewed and case discussed with ED provider. Management plans discussed with the patient, family and they are in agreement.  CODE STATUS: Full code    TOTAL TIME TAKING CARE OF THIS PATIENT: 50 minutes.    Altamese Dilling M.D on 01/16/2015   Between 7am to 6pm - Pager - [phone]  After 6pm go to [email or web address] - password [email or web address] [facility]  [place] [facility]  Office  [phone]  CC: Primary care physician; Ruel Favors, MD   Note: This dictation was prepared with Dragon dictation along with smaller phrase technology. Any transcriptional errors that result from this process are unintentional.

## 2015-01-17 ENCOUNTER — Inpatient Hospital Stay: Payer: Medicaid Other

## 2015-01-17 LAB — CBC
HCT: 29.3 % — ABNORMAL LOW (ref 40.0–52.0)
HEMOGLOBIN: 9.9 g/dL — AB (ref 13.0–18.0)
MCH: 30.1 pg (ref 26.0–34.0)
MCHC: 33.9 g/dL (ref 32.0–36.0)
MCV: 88.6 fL (ref 80.0–100.0)
Platelets: 154 10*3/uL (ref 150–440)
RBC: 3.31 MIL/uL — AB (ref 4.40–5.90)
RDW: 16.6 % — ABNORMAL HIGH (ref 11.5–14.5)
WBC: 7.4 10*3/uL (ref 3.8–10.6)

## 2015-01-17 LAB — BASIC METABOLIC PANEL
ANION GAP: 3 — AB (ref 5–15)
BUN: 30 mg/dL — ABNORMAL HIGH (ref 6–20)
CALCIUM: 8.4 mg/dL — AB (ref 8.9–10.3)
CO2: 26 mmol/L (ref 22–32)
Chloride: 110 mmol/L (ref 101–111)
Creatinine, Ser: 1.78 mg/dL — ABNORMAL HIGH (ref 0.61–1.24)
GFR, EST AFRICAN AMERICAN: 47 mL/min — AB (ref >60)
GFR, EST NON AFRICAN AMERICAN: 41 mL/min — AB (ref >60)
Glucose, Bld: 105 mg/dL — ABNORMAL HIGH (ref 65–99)
Potassium: 4.2 mmol/L (ref 3.5–5.1)
SODIUM: 139 mmol/L (ref 135–145)

## 2015-01-17 LAB — GLUCOSE, CAPILLARY: Glucose-Capillary: 94 mg/dL (ref 65–99)

## 2015-01-17 MED ORDER — AZITHROMYCIN 250 MG PO TABS: 250.0000 mg | ORAL_TABLET | Freq: Every day | ORAL | Status: DC

## 2015-01-17 MED ORDER — TECHNETIUM TC 99M DIETHYLENETRIAME-PENTAACETIC ACID
32.7360 | Freq: Once | INTRAVENOUS | Status: AC | PRN
  Administered 2015-01-17: 32.736 via INTRAVENOUS

## 2015-01-17 MED ORDER — TECHNETIUM TO 99M ALBUMIN AGGREGATED
4.4000 | Freq: Once | INTRAVENOUS | Status: AC | PRN
  Administered 2015-01-17: 4.377 via INTRAVENOUS

## 2015-01-17 MED ORDER — GUAIFENESIN-DM 100-10 MG/5ML PO SYRP: 5.0000 mL | ORAL_SOLUTION | ORAL | Status: DC | PRN

## 2015-01-17 NOTE — Discharge Summary (Signed)
[facility] Physicians - [place] at [facility]   PATIENT NAME: Angel Cruz    MR#:  [id number]  DATE OF BIRTH:  Aug 10, 1955  DATE OF ADMISSION:  01/16/2015 ADMITTING PHYSICIAN: Altamese Dilling, MD  DATE OF DISCHARGE: 01/17/2015  4:31 PM  PRIMARY CARE PHYSICIAN: Ruel Favors, MD    ADMISSION DIAGNOSIS:  Shortness of breath [R06.02] Renal failure [N19]  DISCHARGE DIAGNOSIS:  Principal Problem:   Acute on chronic renal failure (HCC) Active Problems:   Hyperkalemia   SECONDARY DIAGNOSIS:   Past Medical History  Diagnosis Date  . Diabetes mellitus without complication (HCC)   . Hypertension   . Hyperlipidemia   . Depression   . Rotator cuff tear   . CKD (chronic kidney disease), stage III   . Ischemic cardiomyopathy     a. 10/2013 Echo: EF 35-40%, severe distal anteroseptal, anterior, and apical HK. Diast dysfxn, mild conc LVH, mildly dil LA, mild Ao sclerosis w/o stenosis.  Marland Kitchen CAD (coronary artery disease)     a. 10/2013 Lexi MV: EF 31%, apical, septal, ant-apical, inf-apical, lat-apical scar, septal and apical peri-infarct ischemia;  b. 10/2013 Cath: LM nl, LAD 20ost, 171m (faint R->L collats), D1 80p, LCX min irregs, OM1 min irregs, OM2 nl, OM3 30p, RCA 20p, PDA 60, RPL min irregs-->Med Rx.  . Morbid obesity (HCC)   . CHF (congestive heart failure) (HCC)   . OSA (obstructive sleep apnea)   . Sleep apnea     recent test confirmed diagnosis, was performed at the sleep center across the street  . Hypotension     HOSPITAL COURSE:   59 year old male with past medical history of diabetes, hypertension, hyperlipidemia, depression, chronic kidney disease stage III, ischemic cardiomyopathy, morbid obesity, history of CHF, history of sleep apnea who presented to the hospital with a cough and weakness and noted to be in acute on chronic renal failure.  #1 acute on chronic renal failure-this was likely secondary to mild dehydration. Patient was given some  gentle IV fluids and his renal function is back to baseline now.  #2 hyperkalemia-this was likely secondary to the acute renal failure. Patient was given some Kayexalate in the ER. -His potassium level has now normalized as his renal function is improved.  #3 elevated d-dimer-patient underwent a VQ scan as he could not do CT scan given his renal failure. His VQ scan was low probability for PE and he was no longer hypoxic and therefore discharged home.  #4 history of coronary artery disease-patient had no acute chest pain. Patient will continue his aspirin, Crestor, Norvasc, Coreg and submental nitroglycerin as needed.  #5 acute bronchitis-this was likely the cause of patient's cough and weakness. -Patient is being discharged on oral Zithromax.  #6 history of CHF-clinically well in the hospital patient was euvolemic.  #7 diabetes type 2-patient will resume her scheduled insulin upon discharge.  DISCHARGE CONDITIONS:   Stable  CONSULTS OBTAINED:     DRUG ALLERGIES:  No Known Allergies  DISCHARGE MEDICATIONS:   Discharge Medication List as of 01/17/2015  2:16 PM    START taking these medications   Details  azithromycin (ZITHROMAX) 250 MG tablet Take 1 tablet (250 mg total) by mouth daily., Starting 01/17/2015, Until Discontinued, Print    guaiFENesin-dextromethorphan (ROBITUSSIN DM) 100-10 MG/5ML syrup Take 5 mLs by mouth every 4 (four) hours as needed for cough., Starting 01/17/2015, Until Discontinued, Print      CONTINUE these medications which have NOT CHANGED   Details  amLODipine (NORVASC)  5 MG tablet Take 1 tablet (5 mg total) by mouth daily., Starting 10/31/2014, Until Discontinued, Normal    aspirin EC 81 MG tablet Take 81 mg by mouth daily., Until Discontinued, Historical Med    benzonatate (TESSALON) 100 MG capsule Take 1-2 capsules (100-200 mg total) by mouth 3 (three) times daily as needed for cough., Starting 12/06/2014, Until Discontinued, Normal    carvedilol  (COREG) 6.25 MG tablet TAKE 1 TABLET(6.25 MG) BY MOUTH TWICE DAILY WITH A MEAL, Normal    CRESTOR 20 MG tablet TAKE 1 TABLET(20 MG) BY MOUTH AT BEDTIME, Normal    Cyanocobalamin (VITAMIN B-12 PO) Take 1 tablet by mouth daily., Until Discontinued, Historical Med    escitalopram (LEXAPRO) 10 MG tablet Take 1 tablet (10 mg total) by mouth daily., Starting 10/10/2014, Until Discontinued, Normal    Insulin Degludec (TRESIBA FLEXTOUCH) 200 UNIT/ML SOPN Inject 8 Units into the skin daily., Starting 11/12/2014, Until Discontinued, Fax    nitroGLYCERIN (NITROSTAT) 0.4 MG SL tablet Place 0.4 mg under the tongue every 5 (five) minutes as needed for chest pain., Until Discontinued, Historical Med    QUEtiapine (SEROQUEL) 100 MG tablet TAKE 1 TABLET(100 MG) BY MOUTH AT BEDTIME, Normal    rOPINIRole (REQUIP) 0.5 MG tablet Take 1 tablet (0.5 mg total) by mouth at bedtime., Starting 07/30/2014, Until Discontinued, Normal    sodium bicarbonate 650 MG tablet Take 1 tablet (650 mg total) by mouth 2 (two) times daily., Starting 11/27/2014, Until Discontinued, No Print      STOP taking these medications     chlorpheniramine-HYDROcodone (TUSSIONEX PENNKINETIC ER) 10-8 MG/5ML SUER          DISCHARGE INSTRUCTIONS:   DIET:  Cardiac diet and Diabetic diet  DISCHARGE CONDITION:  Stable  ACTIVITY:  Activity as tolerated  OXYGEN:  Home Oxygen: No.   Oxygen Delivery: room air  DISCHARGE LOCATION:  home   If you experience worsening of your admission symptoms, develop shortness of breath, life threatening emergency, suicidal or homicidal thoughts you must seek medical attention immediately by calling 911 or calling your MD immediately  if symptoms less severe.  You Must read complete instructions/literature along with all the possible adverse reactions/side effects for all the Medicines you take and that have been prescribed to you. Take any new Medicines after you have completely understood and accpet  all the possible adverse reactions/side effects.   Please note  You were cared for by a hospitalist during your hospital stay. If you have any questions about your discharge medications or the care you received while you were in the hospital after you are discharged, you can call the unit and asked to speak with the hospitalist on call if the hospitalist that took care of you is not available. Once you are discharged, your primary care physician will handle any further medical issues. Please note that NO REFILLS for any discharge medications will be authorized once you are discharged, as it is imperative that you return to your primary care physician (or establish a relationship with a primary care physician if you do not have one) for your aftercare needs so that they can reassess your need for medications and monitor your lab values.     Today   Positive cough but nonproductive. Shortness of breath much improved. No nausea, chest pain no vomiting.  VITAL SIGNS:  Blood pressure 139/82, pulse 70, temperature 98.2 F (36.8 C), temperature source Oral, resp. rate 18, height  (1.702 m), weight 104.781 kg (231  lb), SpO2 96 %.  I/O:   Intake/Output Summary (Last 24 hours) at 01/17/15 1654 Last data filed at 01/17/15 1130  Gross per 24 hour  Intake    480 ml  Output   1150 ml  Net   -670 ml    PHYSICAL EXAMINATION:  GENERAL:  59 y.o.-year-old obese patient lying in the bed with no acute distress.  EYES: Pupils equal, round, reactive to light and accommodation. No scleral icterus. Extraocular muscles intact.  HEENT: Head atraumatic, normocephalic. Oropharynx and nasopharynx clear.  NECK:  Supple, no jugular venous distention. No thyroid enlargement, no tenderness.  LUNGS: Normal breath sounds bilaterally, no wheezing, rales,rhonchi. No use of accessory muscles of respiration.  CARDIOVASCULAR: S1, S2 normal. No murmurs, rubs, or gallops.  ABDOMEN: Soft, non-tender, non-distended. Bowel  sounds present. No organomegaly or mass.  EXTREMITIES: No pedal edema, cyanosis, or clubbing.  NEUROLOGIC: Cranial nerves II through XII are intact. No focal motor or sensory defecits b/l.  PSYCHIATRIC: The patient is alert and oriented x 3. Good affect.  SKIN: No obvious rash, lesion, or ulcer.   DATA REVIEW:   CBC  Recent Labs Lab 01/17/15 0602  WBC 7.4  HGB 9.9*  HCT 29.3*  PLT 154    Chemistries   Recent Labs Lab 01/17/15 0602  NA 139  K 4.2  CL 110  CO2 26  GLUCOSE 105*  BUN 30*  CREATININE 1.78*  CALCIUM 8.4*    Cardiac Enzymes  Recent Labs Lab 01/16/15 1607  TROPONINI <0.03     RADIOLOGY:  Dg Chest 2 View  01/16/2015  CLINICAL DATA:  Cough for 2 weeks, increased in the last 2 days. Shortness of breath. EXAM: CHEST  2 VIEW COMPARISON:  08/15/2014 FINDINGS: The heart size and mediastinal contours are within normal limits. Both lungs are clear. The visualized skeletal structures are unremarkable. IMPRESSION: No active cardiopulmonary disease. Electronically Signed   By: Charlett Nose M.D.   On: 01/16/2015 16:34   Nm Pulmonary Perf And Vent  01/17/2015  CLINICAL DATA:  Short of breath.  Cough EXAM: NUCLEAR MEDICINE VENTILATION - PERFUSION LUNG SCAN TECHNIQUE: Ventilation images were obtained in multiple projections using inhaled aerosol Tc-65m DTPA. Perfusion images were obtained in multiple projections after intravenous injection of Tc-69m MAA. RADIOPHARMACEUTICALS:  32.7 millicuries Technetium-6m DTPA aerosol inhalation and 4.377 millicuries Technetium-70m MAA IV COMPARISON:  None. FINDINGS: Ventilation: No focal ventilation defect. Perfusion: No wedge shaped peripheral perfusion defects to suggest acute pulmonary embolism. IMPRESSION: Very low probability for acute pulmonary embolus. Electronically Signed   By: Signa Kell M.D.   On: 01/17/2015 12:52      Management plans discussed with the patient, family and they are in agreement.  CODE STATUS:      Code Status Orders        Start     Ordered   01/16/15 2111  Full code   Continuous     01/16/15 2111      TOTAL TIME TAKING CARE OF THIS PATIENT: 40 minutes.    Houston Siren M.D on 01/17/2015 at 4:54 PM  Between 7am to 6pm - Pager - [phone]  After 6pm go to [email or web address] - password [email or web address] [facility]  [place] [facility]  Office  [phone]  CC: Primary care physician; Ruel Favors, MD

## 2015-01-17 NOTE — Progress Notes (Signed)
Pt is a&o, NSR on tele, VSS with no complaints. Orders to d/c pt to home since VQ Scan was negative. Discharge instructions given to pt and wife along with prescriptions for cough meds. Verbal acknowledgment of understanding. IV and tele removed and pt escorted off unit via wheelchair by volunteer services.

## 2015-01-17 NOTE — Progress Notes (Signed)
Initial Nutrition Assessment     INTERVENTION:  Meals and snacks: Cater to pt preferences Nutrition diet education: Discussed with pt and wife high K foods.  Wife already familiar with most high K foods (not aware oranges are high in K and eating them recently), sodium and carb restrictions.    NUTRITION DIAGNOSIS:   Food and nutrition related knowledge deficit related to chronic illness as evidenced by  (elevated K and eating higher K foods).    GOAL:   Patient will meet greater than or equal to 90% of their needs    MONITOR:    (Energy intake, Electrolyte and renal profile)  REASON FOR ASSESSMENT:   Diagnosis    ASSESSMENT:      Pt admitted with acute on chronic renal failure, hyperkalemia, CHF  Past Medical History  Diagnosis Date  . Diabetes mellitus without complication (HCC)   . Hypertension   . Hyperlipidemia   . Depression   . Rotator cuff tear   . CKD (chronic kidney disease), stage III   . Ischemic cardiomyopathy     a. 10/2013 Echo: EF 35-40%, severe distal anteroseptal, anterior, and apical HK. Diast dysfxn, mild conc LVH, mildly dil LA, mild Ao sclerosis w/o stenosis.  Marland Kitchen CAD (coronary artery disease)     a. 10/2013 Lexi MV: EF 31%, apical, septal, ant-apical, inf-apical, lat-apical scar, septal and apical peri-infarct ischemia;  b. 10/2013 Cath: LM nl, LAD 20ost, 159m (faint R->L collats), D1 80p, LCX min irregs, OM1 min irregs, OM2 nl, OM3 30p, RCA 20p, PDA 60, RPL min irregs-->Med Rx.  . Morbid obesity (HCC)   . CHF (congestive heart failure) (HCC)   . OSA (obstructive sleep apnea)   . Sleep apnea     recent test confirmed diagnosis, was performed at the sleep center across the street  . Hypotension     Current Nutrition: ate 100% of lunch  Food/Nutrition-Related History: wife reports good appetite prior to admission except for few days poor appetite prior to admission   Scheduled Medications:  . amLODipine  5 mg Oral Daily  . aspirin EC  81  mg Oral Daily  . azithromycin  250 mg Oral Daily  . carvedilol  6.25 mg Oral BID WC  . escitalopram  10 mg Oral Daily  . heparin  5,000 Units Subcutaneous 3 times per day  . insulin aspart  0-9 Units Subcutaneous TID AC & HS  . QUEtiapine  100 mg Oral QHS  . rOPINIRole  0.5 mg Oral QHS  . rosuvastatin  20 mg Oral q1800  . sodium bicarbonate  650 mg Oral BID  . vitamin B-12  100 mcg Oral Daily    Continuous Medications:  . sodium chloride 75 mL/hr at 01/17/15 0918     Electrolyte/Renal Profile and Glucose Profile:   Recent Labs Lab 01/16/15 1607 01/17/15 0602  NA 139 139  K 5.6* 4.2  CL 108 110  CO2 24 26  BUN 32* 30*  CREATININE 2.17* 1.78*  CALCIUM 9.5 8.4*  GLUCOSE 193* 105*    Gastrointestinal Profile: Last BM:12/6      Weight Change: stable wt per wife Noted wt gain per wt encounters   Diet Order:  Diet heart healthy/carb modified Room service appropriate?: Yes; Fluid consistency:: Thin  Skin:   reviewed   Height:   Ht Readings from Last 1 Encounters:  01/16/15 5\' 7"  (1.702 m)    Weight:   Wt Readings from Last 1 Encounters:  01/16/15 231 lb (104.781 kg)  Ideal Body Weight:     BMI:  Body mass index is 36.17 kg/(m^2).   EDUCATION NEEDS:   Education needs addressed  LOW Care Level  Ancil Dewan B. Freida Busman, RD, LDN [phone] (pager)

## 2015-01-18 ENCOUNTER — Ambulatory Visit: Payer: Self-pay | Admitting: Family Medicine

## 2015-01-24 ENCOUNTER — Other Ambulatory Visit: Payer: Self-pay | Admitting: Family Medicine

## 2015-01-24 NOTE — Telephone Encounter (Signed)
Patient requesting refill. 

## 2015-01-30 ENCOUNTER — Other Ambulatory Visit: Payer: Self-pay | Admitting: Family Medicine

## 2015-01-31 ENCOUNTER — Encounter: Payer: Self-pay | Admitting: Family Medicine

## 2015-01-31 ENCOUNTER — Ambulatory Visit (INDEPENDENT_AMBULATORY_CARE_PROVIDER_SITE_OTHER): Payer: Medicaid Other | Admitting: Family Medicine

## 2015-01-31 VITALS — BP 102/60 | HR 78 | Temp 98.2°F | Resp 18 | Ht 67.0 in | Wt 230.8 lb

## 2015-01-31 DIAGNOSIS — N189 Chronic kidney disease, unspecified: Secondary | ICD-10-CM

## 2015-01-31 DIAGNOSIS — Z09 Encounter for follow-up examination after completed treatment for conditions other than malignant neoplasm: Secondary | ICD-10-CM | POA: Diagnosis not present

## 2015-01-31 DIAGNOSIS — J40 Bronchitis, not specified as acute or chronic: Secondary | ICD-10-CM

## 2015-01-31 DIAGNOSIS — N179 Acute kidney failure, unspecified: Secondary | ICD-10-CM | POA: Diagnosis not present

## 2015-01-31 MED ORDER — FLUTICASONE FUROATE-VILANTEROL 100-25 MCG/INH IN AEPB: 1.0000 | INHALATION_SPRAY | Freq: Every day | RESPIRATORY_TRACT | Status: DC

## 2015-01-31 NOTE — Progress Notes (Signed)
Name: Angel Cruz   MRN: [id number]    DOB: 26-Sep-1955   Date:01/31/2015       Progress Note  Subjective  Chief Complaint  Chief Complaint  Patient presents with  . Hospitalization Follow-up    Went to ER on 12/7-12/8 for cough and SOB was diagnosed with Chronic renal failure and hyperkalemia  . Cough    congested, mild improvement    HPI  Hospital follow up: he was seen at [facility] on 12/7 because he was having a cough and SOB.  He has developed a cough the week before.  He had multiple tests done. CXR negative. D-dimer positive but low risk VQ scan. Echo did not show acute CHF.  He was diagnosed with acute on chronic renal failure, given fluids and discharged home on Z-pack and cough medication for treatment of bronchitis. He continues to cough, it is a wet cough, but no sputum is brought up. He has coughing spells that makes him feel SOB. He finished the Z-pack . No wheezing, no swelling. Appetite is improving. No fever, no chills.   Patient Active Problem List   Diagnosis Date Noted  . Acute on chronic renal failure ([place]) 01/16/2015  . Hyperkalemia 01/16/2015  . 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 Insomnia 11/12/2014  . Essential hypertension, malignant 10/31/2014  . OSA (obstructive sleep apnea) 09/28/2014  . Anemia 09/14/2014  . Cardiomyopathy, ischemic   . Orthostatic hypotension 09/07/2014  . Angioedema 07/30/2014  . RLS (restless legs syndrome) 07/30/2014  . B12 deficiency 07/30/2014  . Vitamin D deficiency 07/30/2014  . Chronic systolic heart failure ([place]) 11/14/2013  . Ischemic cardiomyopathy   . CAD (coronary artery disease)   . Hyperlipidemia   . Uncontrolled diabetes mellitus with stage 3 chronic kidney disease, with long-term current use of  insulin ([place])   . Major depression, recurrent ([place])   . CKD (chronic kidney disease), stage III     Past Surgical History  Procedure Laterality Date  . Cardiac catheterization  10/19/2013    [facility]  . Uvulectomy  1995  . Atrial fibrillation ablation  1995  . Elbow surgery Left   . Rotator cuff repair    . Carpal tunnel release      Family History  Problem Relation Age of Onset  . Stroke Mother   . COPD Mother   . Heart failure Mother   . Heart attack Mother   . Colon cancer Father   . Diabetes Father   . Diabetes Brother   . Cancer Brother     Social History   Social History  . Marital Status: Married    Spouse Name: [name]  . Number of Children: 2  . Years of Education: N/A   Occupational History  . Not on file.   Social History Main Topics  . Smoking status: Never Smoker   . Smokeless tobacco: Never Used  . Alcohol Use: No  . Drug Use: No  . Sexual Activity: No   Other Topics Concern  . Not on file   Social History Narrative     Current outpatient prescriptions:  .  amLODipine (NORVASC) 5 MG tablet, Take 1 tablet (5 mg total) by mouth daily., Disp: 30 tablet, Rfl: 6 .  aspirin EC 81 MG tablet, Take 81 mg by mouth daily., Disp: , Rfl:  .  B-D UF III MINI PEN NEEDLES 31G X 5 MM MISC, USE AS DIRECTED ONCE DAILY, Disp: 100 each, Rfl: 0 .  carvedilol (COREG) 6.25 MG tablet, TAKE 1 TABLET(6.25 MG) BY MOUTH TWICE DAILY WITH A MEAL, Disp: 180 tablet, Rfl: 3 .  CRESTOR 20 MG tablet, TAKE 1 TABLET(20 MG) BY MOUTH AT BEDTIME, Disp: 30 tablet, Rfl: 5 .  Cyanocobalamin (VITAMIN B-12 PO), Take 1 tablet by mouth daily., Disp: , Rfl:  .  escitalopram (LEXAPRO) 10 MG tablet, Take 1 tablet (10 mg total) by mouth daily., Disp: 90 tablet, Rfl: 1 .  Fluticasone Furoate-Vilanterol 100-25 MCG/INH AEPB, Inhale 1 puff into the lungs daily., Disp: 60 each, Rfl: 0 .  Insulin Degludec (TRESIBA FLEXTOUCH) 200 UNIT/ML SOPN, Inject 8 Units into the skin daily. (Patient taking  differently: Inject 3 Units into the skin daily. ), Disp: 5 pen, Rfl: 2 .  nitroGLYCERIN (NITROSTAT) 0.4 MG SL tablet, Place 0.4 mg under the tongue every 5 (five) minutes as needed for chest pain., Disp: , Rfl:  .  rOPINIRole (REQUIP) 0.5 MG tablet, TAKE 1 TABLET(0.5 MG) BY MOUTH AT BEDTIME, Disp: 30 tablet, Rfl: 5 .  sodium bicarbonate 650 MG tablet, Take 1 tablet (650 mg total) by mouth 2 (two) times daily., Disp: 60 tablet, Rfl: 0  No Known Allergies   ROS  Ten systems reviewed and is negative except as mentioned in HPI   Objective  Filed Vitals:   01/31/15 1148  BP: 102/60  Pulse: 78  Temp: 98.2 F (36.8 C)  TempSrc: Oral  Resp: 18  Height: '5\' 7"'$  (1.702 m)  Weight: 230 lb 12.8 oz (104.69 kg)  SpO2: 94%    Body mass index is 36.14 kg/(m^2).  Physical Exam  Constitutional: Patient appears well-developed and well-nourished. Obese No distress.  HEENT: head atraumatic, normocephalic, pupils equal and reactive to light, ears normal TM, neck supple, throat within normal limits Cardiovascular: Normal rate, regular rhythm and normal heart sounds.  No murmur heard. No BLE edema. Pulmonary/Chest: Effort normal and breath sounds normal. No respiratory distress. Abdominal: Soft.  There is no tenderness. Psychiatric: Patient has a normal mood and affect. behavior is normal. Judgment and thought content normal.   Recent Results (from the past 2160 hour(s))  Basic metabolic panel     Status: Abnormal   Collection Time: 01/16/15  4:07 PM  Result Value Ref Range   Sodium 139 135 - 145 mmol/L   Potassium 5.6 (H) 3.5 - 5.1 mmol/L   Chloride 108 101 - 111 mmol/L   CO2 24 22 - 32 mmol/L   Glucose, Bld 193 (H) 65 - 99 mg/dL   BUN 32 (H) 6 - 20 mg/dL   Creatinine, Ser 2.17 (H) 0.61 - 1.24 mg/dL   Calcium 9.5 8.9 - 10.3 mg/dL   GFR calc non Af Amer 32 (L) >60 mL/min   GFR calc Af Amer 37 (L) >60 mL/min    Comment: (NOTE) The eGFR has been calculated using the CKD EPI equation. This  calculation has not been validated in all clinical situations. eGFR's persistently <60 mL/min signify possible Chronic Kidney Disease.    Anion gap 7 5 - 15  CBC     Status: Abnormal   Collection Time: 01/16/15  4:07 PM  Result Value Ref Range   WBC 6.7 3.8 - 10.6 K/uL   RBC 3.63 (L) 4.40 - 5.90 MIL/uL   Hemoglobin 10.7 (L) 13.0 - 18.0 g/dL   HCT 31.7 (L) 40.0 - 52.0 %  MCV 87.4 80.0 - 100.0 fL   MCH 29.4 26.0 - 34.0 pg   MCHC 33.7 32.0 - 36.0 g/dL   RDW 16.8 (H) 11.5 - 14.5 %   Platelets 178 150 - 440 K/uL  Troponin I     Status: None   Collection Time: 01/16/15  4:07 PM  Result Value Ref Range   Troponin I <0.03 <0.031 ng/mL    Comment:        NO INDICATION OF MYOCARDIAL INJURY.   Fibrin derivatives D-Dimer     Status: Abnormal   Collection Time: 01/16/15  4:07 PM  Result Value Ref Range   Fibrin derivatives D-dimer ([facility]) 879 (H) 0 - 499    Comment: <> Exclusion of Venous Thromboembolism (VTE) - OUTPATIENTS ONLY        (Emergency Department or [place])             0-499 ng/ml (FEU)  : With a low to intermediate pretest                                        probability for VTE this test result                                        excludes the diagnosis of VTE.           > 499 ng/ml (FEU)  : VTE not excluded.  Additional work up                                   for VTE is required.   <>  Testing on Inpatients and Evaluation of Disseminated Intravascular        Coagulation ([facility]IC)             Reference Range:   0-499 ng/ml (FEU)   Brain natriuretic peptide     Status: Abnormal   Collection Time: 01/16/15  4:07 PM  Result Value Ref Range   B Natriuretic Peptide 203.0 (H) 0.0 - 100.0 pg/mL  Glucose, capillary     Status: Abnormal   Collection Time: 01/16/15 11:13 PM  Result Value Ref Range   Glucose-Capillary 111 (H) 65 - 99 mg/dL   Comment 1 Notify RN   Basic metabolic panel     Status: Abnormal   Collection Time: 01/17/15  6:02 AM  Result Value Ref Range    Sodium 139 135 - 145 mmol/L   Potassium 4.2 3.5 - 5.1 mmol/L   Chloride 110 101 - 111 mmol/L   CO2 26 22 - 32 mmol/L   Glucose, Bld 105 (H) 65 - 99 mg/dL   BUN 30 (H) 6 - 20 mg/dL   Creatinine, Ser 1.78 (H) 0.61 - 1.24 mg/dL   Calcium 8.4 (L) 8.9 - 10.3 mg/dL   GFR calc non Af Amer 41 (L) >60 mL/min   GFR calc Af Amer 47 (L) >60 mL/min    Comment: (NOTE) The eGFR has been calculated using the CKD EPI equation. This calculation has not been validated in all clinical situations. eGFR's persistently <60 mL/min signify possible Chronic Kidney Disease.    Anion gap 3 (L) 5 - 15  CBC     Status: Abnormal   Collection Time: 01/17/15  6:02 AM  Result Value Ref Range   WBC 7.4 3.8 - 10.6 K/uL   RBC 3.31 (L) 4.40 - 5.90 MIL/uL   Hemoglobin 9.9 (L) 13.0 - 18.0 g/dL   HCT 29.3 (L) 40.0 - 52.0 %   MCV 88.6 80.0 - 100.0 fL   MCH 30.1 26.0 - 34.0 pg   MCHC 33.9 32.0 - 36.0 g/dL   RDW 16.6 (H) 11.5 - 14.5 %   Platelets 154 150 - 440 K/uL  Glucose, capillary     Status: None   Collection Time: 01/17/15  7:34 AM  Result Value Ref Range   Glucose-Capillary 94 65 - 99 mg/dL   Comment 1 Notify RN    PHQ2/9: Depression screen [facility] 2/9 12/06/2014 10/10/2014 07/30/2014  Decreased Interest 1 0 1  Down, Depressed, Hopeless 1 0 1  PHQ - 2 Score 2 0 2  Altered sleeping 0 - 3  Tired, decreased energy 1 - 2  Change in appetite 0 - 2  Feeling bad or failure about yourself  0 - 1  Trouble concentrating 0 - 1  Moving slowly or fidgety/restless 0 - 2  Suicidal thoughts 0 - 0  PHQ-9 Score 3 - 13  Difficult doing work/chores Somewhat difficult - Somewhat difficult     Assessment & Plan  1. Hospital discharge follow-up  Improving, still has a cough, we will try Breo  2. Bronchitis  - Fluticasone Furoate-Vilanterol 100-25 MCG/INH AEPB; Inhale 1 puff into the lungs daily.  Dispense: 60 each; Refill: 0  3. Acute on chronic renal failure ([place])  Keep follow up with nephrologist.

## 2015-02-05 ENCOUNTER — Other Ambulatory Visit
Admission: RE | Admit: 2015-02-05 | Discharge: 2015-02-05 | Disposition: A | Discharge status: Home / Self Care | Payer: Medicaid Other | Source: Ambulatory Visit | Attending: Family Medicine | Admitting: Family Medicine

## 2015-02-05 ENCOUNTER — Encounter: Payer: Self-pay | Admitting: Family Medicine

## 2015-02-05 ENCOUNTER — Ambulatory Visit (INDEPENDENT_AMBULATORY_CARE_PROVIDER_SITE_OTHER): Payer: Medicaid Other | Admitting: Family Medicine

## 2015-02-05 ENCOUNTER — Other Ambulatory Visit: Payer: Self-pay | Admitting: Family Medicine

## 2015-02-05 VITALS — BP 130/78 | HR 73 | Temp 98.0°F | Resp 18 | Ht 67.0 in | Wt 232.4 lb

## 2015-02-05 DIAGNOSIS — G4709 Other insomnia: Secondary | ICD-10-CM

## 2015-02-05 DIAGNOSIS — E1122 Type 2 diabetes mellitus with diabetic chronic kidney disease: Secondary | ICD-10-CM | POA: Insufficient documentation

## 2015-02-05 DIAGNOSIS — F330 Major depressive disorder, recurrent, mild: Secondary | ICD-10-CM

## 2015-02-05 DIAGNOSIS — E1165 Type 2 diabetes mellitus with hyperglycemia: Secondary | ICD-10-CM | POA: Diagnosis not present

## 2015-02-05 DIAGNOSIS — N183 Chronic kidney disease, stage 3 unspecified: Secondary | ICD-10-CM

## 2015-02-05 DIAGNOSIS — E538 Deficiency of other specified B group vitamins: Secondary | ICD-10-CM | POA: Diagnosis not present

## 2015-02-05 DIAGNOSIS — Z794 Long term (current) use of insulin: Secondary | ICD-10-CM | POA: Insufficient documentation

## 2015-02-05 DIAGNOSIS — E559 Vitamin D deficiency, unspecified: Secondary | ICD-10-CM

## 2015-02-05 DIAGNOSIS — E113413 Type 2 diabetes mellitus with severe nonproliferative diabetic retinopathy with macular edema, bilateral: Secondary | ICD-10-CM | POA: Diagnosis not present

## 2015-02-05 DIAGNOSIS — IMO0002 Reserved for concepts with insufficient information to code with codable children: Secondary | ICD-10-CM

## 2015-02-05 DIAGNOSIS — D649 Anemia, unspecified: Secondary | ICD-10-CM

## 2015-02-05 DIAGNOSIS — I10 Essential (primary) hypertension: Secondary | ICD-10-CM | POA: Insufficient documentation

## 2015-02-05 DIAGNOSIS — E113419 Type 2 diabetes mellitus with severe nonproliferative diabetic retinopathy with macular edema, unspecified eye: Secondary | ICD-10-CM

## 2015-02-05 DIAGNOSIS — G4700 Insomnia, unspecified: Secondary | ICD-10-CM

## 2015-02-05 DIAGNOSIS — M25562 Pain in left knee: Secondary | ICD-10-CM

## 2015-02-05 LAB — COMPREHENSIVE METABOLIC PANEL
ALBUMIN: 3.3 g/dL — AB (ref 3.5–5.0)
ALK PHOS: 59 U/L (ref 38–126)
ALT: 16 U/L — AB (ref 17–63)
AST: 20 U/L (ref 15–41)
Anion gap: 4 — ABNORMAL LOW (ref 5–15)
BILIRUBIN TOTAL: 0.2 mg/dL — AB (ref 0.3–1.2)
BUN: 41 mg/dL — ABNORMAL HIGH (ref 6–20)
CALCIUM: 8.9 mg/dL (ref 8.9–10.3)
CO2: 26 mmol/L (ref 22–32)
CREATININE: 1.73 mg/dL — AB (ref 0.61–1.24)
Chloride: 110 mmol/L (ref 101–111)
GFR calc Af Amer: 49 mL/min — ABNORMAL LOW (ref >60)
GFR calc non Af Amer: 42 mL/min — ABNORMAL LOW (ref >60)
GLUCOSE: 168 mg/dL — AB (ref 65–99)
Potassium: 4.8 mmol/L (ref 3.5–5.1)
SODIUM: 140 mmol/L (ref 135–145)
TOTAL PROTEIN: 6.5 g/dL (ref 6.5–8.1)

## 2015-02-05 LAB — CBC WITH DIFFERENTIAL/PLATELET
BASOS ABS: 0.1 10*3/uL (ref 0–0.1)
BASOS PCT: 1 %
EOS ABS: 0.4 10*3/uL (ref 0–0.7)
EOS PCT: 5 %
HCT: 33.6 % — ABNORMAL LOW (ref 40.0–52.0)
Hemoglobin: 11.1 g/dL — ABNORMAL LOW (ref 13.0–18.0)
Lymphocytes Relative: 17 %
Lymphs Abs: 1.4 10*3/uL (ref 1.0–3.6)
MCH: 28.3 pg (ref 26.0–34.0)
MCHC: 32.9 g/dL (ref 32.0–36.0)
MCV: 85.8 fL (ref 80.0–100.0)
MONO ABS: 0.3 10*3/uL (ref 0.2–1.0)
MONOS PCT: 4 %
Neutro Abs: 5.7 10*3/uL (ref 1.4–6.5)
Neutrophils Relative %: 73 %
PLATELETS: 193 10*3/uL (ref 150–440)
RBC: 3.92 MIL/uL — ABNORMAL LOW (ref 4.40–5.90)
RDW: 15.6 % — AB (ref 11.5–14.5)
WBC: 7.8 10*3/uL (ref 3.8–10.6)

## 2015-02-05 LAB — FERRITIN: FERRITIN: 79 ng/mL (ref 24–336)

## 2015-02-05 MED ORDER — TRAZODONE HCL 50 MG PO TABS: 25.0000 mg | ORAL_TABLET | Freq: Every evening | ORAL | Status: DC | PRN

## 2015-02-05 MED ORDER — ESCITALOPRAM OXALATE 10 MG PO TABS: 10.0000 mg | ORAL_TABLET | Freq: Every day | ORAL | Status: AC

## 2015-02-05 NOTE — Progress Notes (Signed)
Name: Angel Cruz   MRN: [id number]    DOB: 04-May-1955   Date:02/05/2015       Progress Note  Subjective  Chief Complaint  Chief Complaint  Patient presents with  . Medication Management    1 week F/U  . Insomnia    Patient has been off of Seroquel for a month and a half and Kidney doctor wanted him off of this medication. Has been taking otc Sleep Medicine with no help, trouble staying asleep.  . Cough    Breo did not help his cough  . Depression    Well controlled  . Hyperlipidemia    HPI  Insomnia: he was doing well with Seroquel, but not approved by insurance and nephrologist also helped him to change to another medication. Discussed trying Trazodone and he would like to try it  Cough: he was admitted for bronchitis, and was given antibiotics, seen last week in our office and was given Breo, he still has a dry cough, but has improved. No fever or SOB, no side effects of medication  Depression Major: currently doing well, he has episodes that he feels down, worries about his health, and does not like to stay home by himself. No longer has crying spells or suicidal thoughts. He is doing well on Lexapro.  Hyperlipidemia: taking Crestor, last lipid done less than 6 months ago.   DM with [facility]: sees Dr. Juleen China, recently admitted for acute on chronic renal failure, still using Tresiba 3 units daily, denies hypoglycemia.  Glucose at home is 90-109 fasting and high of 157 post-prandially  . He denies polyphagia, polyuria or polydipsia. He is on aspirin. He is seeing ophthalmologist, and having bilateral injections for macular degeneration, already had a laser therapy also.   Left knee pain: he states that he has recurrent left knee pain for years, but over the past couple of weeks he has noticed worsening of pain and also has a left knee effusion . Not taking any otc medication. No redness. Pain is described as aching like.   Patient Active Problem List   Diagnosis Date Noted  . Acute  on chronic renal failure ([place]) 01/16/2015  . 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 Insomnia 11/12/2014  . Hypertension, benign 10/31/2014  . OSA (obstructive sleep apnea) 09/28/2014  . Anemia 09/14/2014  . Cardiomyopathy, ischemic   . Orthostatic hypotension 09/07/2014  . Angioedema 07/30/2014  . RLS (restless legs syndrome) 07/30/2014  . B12 deficiency 07/30/2014  . Vitamin D deficiency 07/30/2014  . Chronic systolic heart failure ([place]) 11/14/2013  . Ischemic cardiomyopathy   . CAD (coronary artery disease)   . Hyperlipidemia   . Uncontrolled diabetes mellitus with stage 3 chronic kidney disease, with long-term current use of insulin ([place])   . Major depression, recurrent ([place])   . CKD (chronic kidney disease), stage III     Past Surgical History  Procedure Laterality Date  . Cardiac catheterization  10/19/2013    [facility]  . Uvulectomy  1995  . Atrial fibrillation ablation  1995  . Elbow surgery Left   . Rotator cuff repair    . Carpal tunnel release      Family History  Problem Relation Age of Onset  . Stroke Mother   . COPD Mother   . Heart failure Mother   . Heart attack  Mother   . Colon cancer Father   . Diabetes Father   . Diabetes Brother   . Cancer Brother     Social History   Social History  . Marital Status: Married    Spouse Name: [name]  . Number of Children: 2  . Years of Education: N/A   Occupational History  . Not on file.   Social History Main Topics  . Smoking status: Never Smoker   . Smokeless tobacco: Never Used  . Alcohol Use: No  . Drug Use: No  . Sexual Activity: No   Other Topics Concern  . Not on file   Social History Narrative     Current outpatient prescriptions:  .  amLODipine (NORVASC) 5 MG tablet, Take 1  tablet (5 mg total) by mouth daily., Disp: 30 tablet, Rfl: 6 .  aspirin EC 81 MG tablet, Take 81 mg by mouth daily., Disp: , Rfl:  .  B-D UF III MINI PEN NEEDLES 31G X 5 MM MISC, USE AS DIRECTED ONCE DAILY, Disp: 100 each, Rfl: 0 .  carvedilol (COREG) 6.25 MG tablet, TAKE 1 TABLET(6.25 MG) BY MOUTH TWICE DAILY WITH A MEAL, Disp: 180 tablet, Rfl: 3 .  CRESTOR 20 MG tablet, TAKE 1 TABLET(20 MG) BY MOUTH AT BEDTIME, Disp: 30 tablet, Rfl: 5 .  Cyanocobalamin (VITAMIN B-12 PO), Take 1 tablet by mouth daily., Disp: , Rfl:  .  escitalopram (LEXAPRO) 10 MG tablet, Take 1 tablet (10 mg total) by mouth daily., Disp: 90 tablet, Rfl: 1 .  Fluticasone Furoate-Vilanterol 100-25 MCG/INH AEPB, Inhale 1 puff into the lungs daily., Disp: 60 each, Rfl: 0 .  Insulin Degludec (TRESIBA FLEXTOUCH) 200 UNIT/ML SOPN, Inject 8 Units into the skin daily. (Patient taking differently: Inject 3 Units into the skin daily. ), Disp: 5 pen, Rfl: 2 .  nitroGLYCERIN (NITROSTAT) 0.4 MG SL tablet, Place 0.4 mg under the tongue every 5 (five) minutes as needed for chest pain., Disp: , Rfl:  .  rOPINIRole (REQUIP) 0.5 MG tablet, TAKE 1 TABLET(0.5 MG) BY MOUTH AT BEDTIME, Disp: 30 tablet, Rfl: 5 .  sodium bicarbonate 650 MG tablet, Take 1 tablet (650 mg total) by mouth 2 (two) times daily., Disp: 60 tablet, Rfl: 0 .  traZODone (DESYREL) 50 MG tablet, Take 0.5-1.5 tablets (25-75 mg total) by mouth at bedtime as needed for sleep., Disp: 45 tablet, Rfl: 0  Allergies  Allergen Reactions  . Ace Inhibitors     Kidney failure  . Valsartan Other (See Comments)    Kidney failure     ROS  Constitutional: Negative for fever or significant weight change.  Respiratory: Positive  for cough no shortness of breath.   Cardiovascular: Negative for chest pain or palpitations.  Gastrointestinal: Negative for abdominal pain, no bowel changes.  Musculoskeletal: Negative for gait problem , positive for left knee swelling.  Skin: Negative for rash.   Neurological: Negative for dizziness or headache.  No other specific complaints in a complete review of systems (except as listed in HPI above).  Objective  Filed Vitals:   02/05/15 1419  BP: 130/78  Pulse: 73  Temp: 98 F (36.7 C)  TempSrc: Oral  Resp: 18  Height: '5\' 7"'$  (1.702 m)  Weight: 232 lb 6.4 oz (105.416 kg)  SpO2: 93%    Body mass index is 36.39 kg/(m^2).  Physical Exam  Constitutional: Patient appears well-developed  Obese  No distress.  HEENT: head atraumatic, normocephalic, pupils equal and reactive to light,  neck supple,  throat within normal limits Cardiovascular: Normal rate, regular rhythm and normal heart sounds.  No murmur heard. No BLE edema. Pulmonary/Chest: Effort normal and breath sounds normal. No respiratory distress. Abdominal: Soft.  There is no tenderness. Psychiatric: Patient has a normal mood and affect. behavior is normal. Judgment and thought content normal. Muscular Skeletal: mild effusion, no redness or increase in warmth of left knee, crepitus with extension of left knee  Recent Results (from the past 2160 hour(s))  Basic metabolic panel     Status: Abnormal   Collection Time: 01/16/15  4:07 PM  Result Value Ref Range   Sodium 139 135 - 145 mmol/L   Potassium 5.6 (H) 3.5 - 5.1 mmol/L   Chloride 108 101 - 111 mmol/L   CO2 24 22 - 32 mmol/L   Glucose, Bld 193 (H) 65 - 99 mg/dL   BUN 32 (H) 6 - 20 mg/dL   Creatinine, Ser 2.17 (H) 0.61 - 1.24 mg/dL   Calcium 9.5 8.9 - 10.3 mg/dL   GFR calc non Af Amer 32 (L) >60 mL/min   GFR calc Af Amer 37 (L) >60 mL/min    Comment: (NOTE) The eGFR has been calculated using the CKD EPI equation. This calculation has not been validated in all clinical situations. eGFR's persistently <60 mL/min signify possible Chronic Kidney Disease.    Anion gap 7 5 - 15  CBC     Status: Abnormal   Collection Time: 01/16/15  4:07 PM  Result Value Ref Range   WBC 6.7 3.8 - 10.6 K/uL   RBC 3.63 (L) 4.40 - 5.90  MIL/uL   Hemoglobin 10.7 (L) 13.0 - 18.0 g/dL   HCT 31.7 (L) 40.0 - 52.0 %   MCV 87.4 80.0 - 100.0 fL   MCH 29.4 26.0 - 34.0 pg   MCHC 33.7 32.0 - 36.0 g/dL   RDW 16.8 (H) 11.5 - 14.5 %   Platelets 178 150 - 440 K/uL  Troponin I     Status: None   Collection Time: 01/16/15  4:07 PM  Result Value Ref Range   Troponin I <0.03 <0.031 ng/mL    Comment:        NO INDICATION OF MYOCARDIAL INJURY.   Fibrin derivatives D-Dimer     Status: Abnormal   Collection Time: 01/16/15  4:07 PM  Result Value Ref Range   Fibrin derivatives D-dimer ([facility]) 879 (H) 0 - 499    Comment: <> Exclusion of Venous Thromboembolism (VTE) - OUTPATIENTS ONLY        (Emergency Department or [place])             0-499 ng/ml (FEU)  : With a low to intermediate pretest                                        probability for VTE this test result                                        excludes the diagnosis of VTE.           > 499 ng/ml (FEU)  : VTE not excluded.  Additional work up  for VTE is required.   <>  Testing on Inpatients and Evaluation of Disseminated Intravascular        Coagulation ([facility])             Reference Range:   0-499 ng/ml (FEU)   Brain natriuretic peptide     Status: Abnormal   Collection Time: 01/16/15  4:07 PM  Result Value Ref Range   B Natriuretic Peptide 203.0 (H) 0.0 - 100.0 pg/mL  Glucose, capillary     Status: Abnormal   Collection Time: 01/16/15 11:13 PM  Result Value Ref Range   Glucose-Capillary 111 (H) 65 - 99 mg/dL   Comment 1 Notify RN   Basic metabolic panel     Status: Abnormal   Collection Time: 01/17/15  6:02 AM  Result Value Ref Range   Sodium 139 135 - 145 mmol/L   Potassium 4.2 3.5 - 5.1 mmol/L   Chloride 110 101 - 111 mmol/L   CO2 26 22 - 32 mmol/L   Glucose, Bld 105 (H) 65 - 99 mg/dL   BUN 30 (H) 6 - 20 mg/dL   Creatinine, Ser 1.78 (H) 0.61 - 1.24 mg/dL   Calcium 8.4 (L) 8.9 - 10.3 mg/dL   GFR calc non Af Amer 41 (L) >60 mL/min    GFR calc Af Amer 47 (L) >60 mL/min    Comment: (NOTE) The eGFR has been calculated using the CKD EPI equation. This calculation has not been validated in all clinical situations. eGFR's persistently <60 mL/min signify possible Chronic Kidney Disease.    Anion gap 3 (L) 5 - 15  CBC     Status: Abnormal   Collection Time: 01/17/15  6:02 AM  Result Value Ref Range   WBC 7.4 3.8 - 10.6 K/uL   RBC 3.31 (L) 4.40 - 5.90 MIL/uL   Hemoglobin 9.9 (L) 13.0 - 18.0 g/dL   HCT 29.3 (L) 40.0 - 52.0 %   MCV 88.6 80.0 - 100.0 fL   MCH 30.1 26.0 - 34.0 pg   MCHC 33.9 32.0 - 36.0 g/dL   RDW 16.6 (H) 11.5 - 14.5 %   Platelets 154 150 - 440 K/uL  Glucose, capillary     Status: None   Collection Time: 01/17/15  7:34 AM  Result Value Ref Range   Glucose-Capillary 94 65 - 99 mg/dL   Comment 1 Notify RN      PHQ2/9: Depression screen [facility] 2/9 02/05/2015 12/06/2014 10/10/2014 07/30/2014  Decreased Interest 0 1 0 1  Down, Depressed, Hopeless 0 1 0 1  PHQ - 2 Score 0 2 0 2  Altered sleeping - 0 - 3  Tired, decreased energy - 1 - 2  Change in appetite - 0 - 2  Feeling bad or failure about yourself  - 0 - 1  Trouble concentrating - 0 - 1  Moving slowly or fidgety/restless - 0 - 2  Suicidal thoughts - 0 - 0  PHQ-9 Score - 3 - 13  Difficult doing work/chores - Somewhat difficult - Somewhat difficult    Fall Risk: Fall Risk  02/05/2015 12/06/2014 10/10/2014 07/30/2014  Falls in the past year? Yes No No No  Number falls in past yr: 2 or more - - -  Injury with Fall? No - - -    Assessment & Plan  1. Uncontrolled type 2 diabetes mellitus with stage 3 chronic kidney disease, with long-term current use of insulin (HCC)  - Hemoglobin A1c  2. CKD (chronic kidney disease), stage  III  - Comprehensive metabolic panel - Parathyroid hormone, intact (no Ca)  3. Mild episode of recurrent major depressive disorder (HCC)  Doing well, continue Lexapro  - escitalopram (LEXAPRO) 10 MG tablet; Take 1  tablet (10 mg total) by mouth daily.  Dispense: 90 tablet; Refill: 1 - traZODone (DESYREL) 50 MG tablet; Take 0.5-1.5 tablets (25-75 mg total) by mouth at bedtime as needed for sleep.  Dispense: 45 tablet; Refill: 0  4. Insomnia  - traZODone (DESYREL) 50 MG tablet; Take 0.5-1.5 tablets (25-75 mg total) by mouth at bedtime as needed for sleep.  Dispense: 45 tablet; Refill: 0  5. Hypertension, benign  - Comprehensive metabolic panel  6. Anemia, unspecified  - CBC with Differential/Platelet - Ferritin  7. B12 deficiency  - Vitamin B12  8. Vitamin D deficiency  - VITAMIN D 25 Hydroxy (Vit-D Deficiency, Fractures)  9. 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

## 2015-02-05 NOTE — Telephone Encounter (Signed)
Patient requesting refill. 

## 2015-02-06 ENCOUNTER — Other Ambulatory Visit
Admission: RE | Admit: 2015-02-06 | Discharge: 2015-02-06 | Disposition: A | Discharge status: Home / Self Care | Payer: Medicaid Other | Source: Ambulatory Visit | Attending: Family Medicine | Admitting: Family Medicine

## 2015-02-06 DIAGNOSIS — E1122 Type 2 diabetes mellitus with diabetic chronic kidney disease: Secondary | ICD-10-CM | POA: Insufficient documentation

## 2015-02-06 LAB — HEMOGLOBIN A1C: HEMOGLOBIN A1C: 6 % (ref 4.0–6.0)

## 2015-02-06 LAB — VITAMIN B12: Vitamin B-12: 1032 pg/mL — ABNORMAL HIGH (ref 180–914)

## 2015-02-07 LAB — PARATHYROID HORMONE, INTACT (NO CA): PTH: 74 pg/mL — AB (ref 15–65)

## 2015-02-07 LAB — VITAMIN D 25 HYDROXY (VIT D DEFICIENCY, FRACTURES): VIT D 25 HYDROXY: 12.1 ng/mL — AB (ref 30.0–100.0)

## 2015-02-10 ENCOUNTER — Emergency Department
Admission: EM | Admit: 2015-02-10 | Discharge: 2015-02-11 | Disposition: A | Discharge status: Home / Self Care | Payer: Medicaid Other | Attending: Emergency Medicine | Admitting: Emergency Medicine

## 2015-02-10 ENCOUNTER — Emergency Department: Payer: Medicaid Other

## 2015-02-10 DIAGNOSIS — R059 Cough, unspecified: Secondary | ICD-10-CM

## 2015-02-10 DIAGNOSIS — Z7951 Long term (current) use of inhaled steroids: Secondary | ICD-10-CM | POA: Diagnosis not present

## 2015-02-10 DIAGNOSIS — E11311 Type 2 diabetes mellitus with unspecified diabetic retinopathy with macular edema: Secondary | ICD-10-CM | POA: Diagnosis not present

## 2015-02-10 DIAGNOSIS — Z7982 Long term (current) use of aspirin: Secondary | ICD-10-CM | POA: Diagnosis not present

## 2015-02-10 DIAGNOSIS — Z794 Long term (current) use of insulin: Secondary | ICD-10-CM | POA: Insufficient documentation

## 2015-02-10 DIAGNOSIS — R05 Cough: Secondary | ICD-10-CM | POA: Diagnosis not present

## 2015-02-10 DIAGNOSIS — I129 Hypertensive chronic kidney disease with stage 1 through stage 4 chronic kidney disease, or unspecified chronic kidney disease: Secondary | ICD-10-CM | POA: Diagnosis not present

## 2015-02-10 DIAGNOSIS — E1122 Type 2 diabetes mellitus with diabetic chronic kidney disease: Secondary | ICD-10-CM | POA: Insufficient documentation

## 2015-02-10 DIAGNOSIS — N183 Chronic kidney disease, stage 3 (moderate): Secondary | ICD-10-CM | POA: Diagnosis not present

## 2015-02-10 DIAGNOSIS — R0602 Shortness of breath: Secondary | ICD-10-CM | POA: Diagnosis present

## 2015-02-10 DIAGNOSIS — Z79899 Other long term (current) drug therapy: Secondary | ICD-10-CM | POA: Insufficient documentation

## 2015-02-10 LAB — CBC
HEMATOCRIT: 31.7 % — AB (ref 40.0–52.0)
Hemoglobin: 10.8 g/dL — ABNORMAL LOW (ref 13.0–18.0)
MCH: 29.7 pg (ref 26.0–34.0)
MCHC: 34.1 g/dL (ref 32.0–36.0)
MCV: 87 fL (ref 80.0–100.0)
PLATELETS: 196 10*3/uL (ref 150–440)
RBC: 3.65 MIL/uL — ABNORMAL LOW (ref 4.40–5.90)
RDW: 16.2 % — AB (ref 11.5–14.5)
WBC: 7.6 10*3/uL (ref 3.8–10.6)

## 2015-02-10 LAB — BASIC METABOLIC PANEL
Anion gap: 5 (ref 5–15)
BUN: 45 mg/dL — AB (ref 6–20)
CO2: 24 mmol/L (ref 22–32)
CREATININE: 2.02 mg/dL — AB (ref 0.61–1.24)
Calcium: 9.2 mg/dL (ref 8.9–10.3)
Chloride: 112 mmol/L — ABNORMAL HIGH (ref 101–111)
GFR, EST AFRICAN AMERICAN: 40 mL/min — AB (ref >60)
GFR, EST NON AFRICAN AMERICAN: 35 mL/min — AB (ref >60)
Glucose, Bld: 156 mg/dL — ABNORMAL HIGH (ref 65–99)
POTASSIUM: 4.8 mmol/L (ref 3.5–5.1)
SODIUM: 141 mmol/L (ref 135–145)

## 2015-02-10 LAB — TROPONIN I: Troponin I: <0.03 ng/mL (ref <0.031)

## 2015-02-10 NOTE — ED Notes (Addendum)
Pt has had cough since the end of November; nonproductive; short of breath with exertion; low back pain; denies fever; pt talking in complete coherent sentences; pt adds he had some chest pain yesterday to center of chest; resolved on it's own after 5-10 minutes; wife adds yesterday pt was slurring his speech "a little bit"; and then last night she noticed his mouth was drawing to the right and his tongue was sticking out while he slept;

## 2015-02-10 NOTE — ED Notes (Signed)
Pt. States dry non-productive cough for the past month.

## 2015-02-10 NOTE — ED Notes (Signed)
Pt. States he was admitted here last month for stage 3 renal failure.

## 2015-02-11 LAB — TROPONIN I

## 2015-02-11 LAB — BRAIN NATRIURETIC PEPTIDE: B NATRIURETIC PEPTIDE 5: 266 pg/mL — AB (ref 0.0–100.0)

## 2015-02-11 MED ORDER — HYDROCOD POLST-CPM POLST ER 10-8 MG/5ML PO SUER
5.0000 mL | Freq: Once | ORAL | Status: AC
  Administered 2015-02-11: 5 mL via ORAL
  Filled 2015-02-11: qty 5

## 2015-02-11 MED ORDER — HYDROCOD POLST-CPM POLST ER 10-8 MG/5ML PO SUER: 5.0000 mL | Freq: Two times a day (BID) | ORAL | Status: DC | PRN

## 2015-02-11 NOTE — ED Provider Notes (Signed)
[facility] Emergency Department Provider Note  ____________________________________________  Time seen: Approximately 2342 PM  I have reviewed the triage vital signs and the nursing notes.   HISTORY  Chief Complaint Cough and Shortness of Breath    HPI Angel Cruz is a 60 y.o. male who comes into the hospital today with coughing and shortness of breath. The patient reports he feels short of breath when he moves around. He reports that the symptoms started yesterday. He's had some shortness of breath in the past and he has a history of CHF and diabetes. He reports that he has some chest pain when he coughs but not a lot. He denies any headache or blurred vision. He does not take any fluid pills for his heart failure. The patient was here in December 7 for the same thing and admitted for renal failure. The patient was concerned given the symptoms that he was having some runny failure again so he decided to come in for evaluation. Also feels that he may have some bronchitis causing his symptoms. He has not had any fevers has not been coughing up any greenish or yellow sputum. The patient has not taken anything for the cough.   Past Medical History  Diagnosis Date  . Diabetes mellitus without complication (HCC)   . Hypertension   . Hyperlipidemia   . Depression   . Rotator cuff tear   . CKD (chronic kidney disease), stage III   . Ischemic cardiomyopathy     a. 10/2013 Echo: EF 35-40%, severe distal anteroseptal, anterior, and apical HK. Diast dysfxn, mild conc LVH, mildly dil LA, mild Ao sclerosis w/o stenosis.  Marland Kitchen CAD (coronary artery disease)     a. 10/2013 Lexi MV: EF 31%, apical, septal, ant-apical, inf-apical, lat-apical scar, septal and apical peri-infarct ischemia;  b. 10/2013 Cath: LM nl, LAD 20ost, 112m (faint R->L collats), D1 80p, LCX min irregs, OM1 min irregs, OM2 nl, OM3 30p, RCA 20p, PDA 60, RPL min irregs-->Med Rx.  . Morbid obesity (HCC)   . CHF  (congestive heart failure) (HCC)   . OSA (obstructive sleep apnea)   . Sleep apnea     recent test confirmed diagnosis, was performed at the sleep center across the street  . Hypotension     Patient Active Problem List   Diagnosis Date Noted  . Acute on chronic renal failure (HCC) 01/16/2015  . 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 Insomnia 11/12/2014  . Hypertension, benign 10/31/2014  . OSA (obstructive sleep apnea) 09/28/2014  . Anemia of chronic disease 09/14/2014  . Cardiomyopathy, ischemic   . Orthostatic hypotension 09/07/2014  . Angioedema 07/30/2014  . RLS (restless legs syndrome) 07/30/2014  . B12 deficiency 07/30/2014  . Vitamin D deficiency 07/30/2014  . Chronic systolic heart failure (HCC) 11/14/2013  . Ischemic cardiomyopathy   . CAD (coronary artery disease)   . Hyperlipidemia   . Uncontrolled diabetes mellitus with stage 3 chronic kidney disease, with long-term current use of insulin (HCC)   . Major depression, recurrent (HCC)   . CKD (chronic kidney disease), stage III     Past Surgical History  Procedure Laterality Date  . Cardiac catheterization  10/19/2013    [facility]  . Uvulectomy  1995  . Atrial fibrillation ablation  1995  . Elbow surgery Left   . Rotator  cuff repair    . Carpal tunnel release      Current Outpatient Rx  Name  Route  Sig  Dispense  Refill  . amLODipine (NORVASC) 5 MG tablet   Oral   Take 1 tablet (5 mg total) by mouth daily.   30 tablet   6   . aspirin EC 81 MG tablet   Oral   Take 81 mg by mouth daily.         . carvedilol (COREG) 6.25 MG tablet      TAKE 1 TABLET(6.25 MG) BY MOUTH TWICE DAILY WITH A MEAL   180 tablet   3     **Patient requests 90 days supply**   . CRESTOR 20 MG tablet      TAKE 1  TABLET(20 MG) BY MOUTH AT BEDTIME   30 tablet   5   . Cyanocobalamin (VITAMIN B-12 PO)   Oral   Take 1 tablet by mouth daily.         Marland Kitchen escitalopram (LEXAPRO) 10 MG tablet   Oral   Take 1 tablet (10 mg total) by mouth daily.   90 tablet   1   . Fluticasone Furoate-Vilanterol 100-25 MCG/INH AEPB   Inhalation   Inhale 1 puff into the lungs daily.   60 each   0   . Insulin Degludec (TRESIBA FLEXTOUCH) 200 UNIT/ML SOPN   Subcutaneous   Inject 8 Units into the skin daily. Patient taking differently: Inject 3 Units into the skin daily.    5 pen   2   . nitroGLYCERIN (NITROSTAT) 0.4 MG SL tablet   Sublingual   Place 0.4 mg under the tongue every 5 (five) minutes as needed for chest pain.         Marland Kitchen rOPINIRole (REQUIP) 0.5 MG tablet      TAKE 1 TABLET(0.5 MG) BY MOUTH AT BEDTIME   30 tablet   5   . sodium bicarbonate 650 MG tablet   Oral   Take 1 tablet (650 mg total) by mouth 2 (two) times daily.   60 tablet   0   . traZODone (DESYREL) 50 MG tablet      TAKE 1/2 TO 1 AND 1/2 TABLETS(25 TO 75 MG) BY MOUTH AT BEDTIME AS NEEDED FOR SLEEP   135 tablet   0     **Patient requests 90 days supply**   . B-D UF III MINI PEN NEEDLES 31G X 5 MM MISC      USE AS DIRECTED ONCE DAILY   100 each   0   . chlorpheniramine-HYDROcodone (TUSSIONEX PENNKINETIC ER) 10-8 MG/5ML SUER   Oral   Take 5 mLs by mouth every 12 (twelve) hours as needed for cough.   70 mL   0     Allergies Ace inhibitors and Valsartan  Family History  Problem Relation Age of Onset  . Stroke Mother   . COPD Mother   . Heart failure Mother   . Heart attack Mother   . Colon cancer Father   . Diabetes Father   . Diabetes Brother   . Cancer Brother     Social History Social History  Substance Use Topics  . Smoking status: Never Smoker   . Smokeless tobacco: Never Used  . Alcohol Use: No    Review of Systems Constitutional: No fever/chills Eyes: No visual changes. ENT: No sore  throat. Cardiovascular:  chest pain. Respiratory: Cough and shortness of breath. Gastrointestinal: No abdominal pain.  No  nausea, no vomiting.  No diarrhea.  No constipation. Genitourinary: Negative for dysuria. Musculoskeletal: Negative for back pain. Skin: Negative for rash. Neurological: Negative for headaches, focal weakness or numbness.  10-point ROS otherwise negative.  ____________________________________________   PHYSICAL EXAM:  VITAL SIGNS: ED Triage Vitals  Enc Vitals Group     BP 02/10/15 2102 167/93 mmHg     Pulse Rate 02/10/15 2102 74     Resp 02/10/15 2102 18     Temp 02/10/15 2102 98.3 F (36.8 C)     Temp Source 02/10/15 2102 Oral     SpO2 02/10/15 2102 97 %     Weight --      Height --      Head Cir --      Peak Flow --      Pain Score 02/10/15 2105 8     Pain Loc --      Pain Edu? --      Excl. in GC? --     Constitutional: Alert and oriented. Well appearing and in mild distress. Eyes: Conjunctivae are normal. PERRL. EOMI. Head: Atraumatic. Nose: No congestion/rhinnorhea. Mouth/Throat: Mucous membranes are moist.  Oropharynx non-erythematous. Cardiovascular: Normal rate, regular rhythm. Grossly normal heart sounds.  Good peripheral circulation. Respiratory: Normal respiratory effort.  No retractions. Lungs CTAB. Gastrointestinal: Soft and nontender. No distention. Positive bowel sounds Musculoskeletal: No lower extremity tenderness nor edema.   Neurologic:  Normal speech and language. No gross focal neurologic deficits are appreciated.  Skin:  Skin is warm, dry and intact.  Psychiatric: Mood and affect are normal.   ____________________________________________   LABS (all labs ordered are listed, but only abnormal results are displayed)  Labs Reviewed  BASIC METABOLIC PANEL - Abnormal; Notable for the following:    Chloride 112 (*)    Glucose, Bld 156 (*)    BUN 45 (*)    Creatinine, Ser 2.02 (*)    GFR calc non Af Amer 35 (*)    GFR  calc Af Amer 40 (*)    All other components within normal limits  CBC - Abnormal; Notable for the following:    RBC 3.65 (*)    Hemoglobin 10.8 (*)    HCT 31.7 (*)    RDW 16.2 (*)    All other components within normal limits  BRAIN NATRIURETIC PEPTIDE - Abnormal; Notable for the following:    B Natriuretic Peptide 266.0 (*)    All other components within normal limits  TROPONIN I  TROPONIN I   ____________________________________________  EKG  ED ECG REPORT I, Rebecka Apley, the attending physician, personally viewed and interpreted this ECG.   Date: 02/10/2015  EKG Time: 2123  Rate: 75  Rhythm: normal sinus rhythm  Axis: Normal  Intervals:none  ST&T Change: Flipped T-wave in lead 1 and aVL  ____________________________________________  RADIOLOGY  Chest x-ray: No active cardiopulmonary disease ____________________________________________   PROCEDURES  Procedure(s) performed: None  Critical Care performed: No  ____________________________________________   INITIAL IMPRESSION / ASSESSMENT AND PLAN / ED COURSE  Pertinent labs & imaging results that were available during my care of the patient were reviewed by me and considered in my medical decision making (see chart for details).  This is a 60 year old male who comes into the hospital today with some cough and shortness of breath. I did give the patient a dose of Tussionex which did help with his cough. I did check the patient's BNP which did not show any severe elevation. The patient  has been resting on the stretcher without any discomfort and has not had any further coughing. I will discharge the patient to home. The patient has no other complaints at this time he reports he feels well and will follow-up with his regular primary care physician. ____________________________________________   FINAL CLINICAL IMPRESSION(S) / ED DIAGNOSES  Final diagnoses:  Cough  SOB (shortness of breath)      Rebecka Apley, MD 02/11/15 [phone]

## 2015-02-11 NOTE — Discharge Instructions (Signed)
Cough, Adult °Coughing is a reflex that clears your throat and your airways. Coughing helps to heal and protect your lungs. It is normal to cough occasionally, but a cough that happens with other symptoms or lasts a long time may be a sign of a condition that needs treatment. A cough may last only 2-3 weeks (acute), or it may last longer than 8 weeks (chronic). °CAUSES °Coughing is commonly caused by: °· Breathing in substances that irritate your lungs. °· A viral or bacterial respiratory infection. °· Allergies. °· Asthma. °· Postnasal drip. °· Smoking. °· Acid backing up from the stomach into the esophagus (gastroesophageal reflux). °· Certain medicines. °· Chronic lung problems, including COPD (or rarely, lung cancer). °· Other medical conditions such as heart failure. °HOME CARE INSTRUCTIONS  °Pay attention to any changes in your symptoms. Take these actions to help with your discomfort: °· Take medicines only as told by your health care provider. °· If you were prescribed an antibiotic medicine, take it as told by your health care provider. Do not stop taking the antibiotic even if you start to feel better. °· Talk with your health care provider before you take a cough suppressant medicine. °· Drink enough fluid to keep your urine clear or pale yellow. °· If the air is dry, use a cold steam vaporizer or humidifier in your bedroom or your home to help loosen secretions. °· Avoid anything that causes you to cough at work or at home. °· If your cough is worse at night, try sleeping in a semi-upright position. °· Avoid cigarette smoke. If you smoke, quit smoking. If you need help quitting, ask your health care provider. °· Avoid caffeine. °· Avoid alcohol. °· Rest as needed. °SEEK MEDICAL CARE IF:  °· You have new symptoms. °· You cough up pus. °· Your cough does not get better after 2-3 weeks, or your cough gets worse. °· You cannot control your cough with suppressant medicines and you are losing sleep. °· You  develop pain that is getting worse or pain that is not controlled with pain medicines. °· You have a fever. °· You have unexplained weight loss. °· You have night sweats. °SEEK IMMEDIATE MEDICAL CARE IF: °· You cough up blood. °· You have difficulty breathing. °· Your heartbeat is very fast. °  °This information is not intended to replace advice given to you by your health care provider. Make sure you discuss any questions you have with your health care provider. °  °Document Released: 07/25/2010 Document Revised: 10/17/2014 Document Reviewed: 04/04/2014 °Elsevier Interactive Patient Education ©2016 Elsevier Inc. ° °Shortness of Breath °Shortness of breath means you have trouble breathing. It could also mean that you have a medical problem. You should get immediate medical care for shortness of breath. °CAUSES  °· Not enough oxygen in the air such as with high altitudes or a smoke-filled room. °· Certain lung diseases, infections, or problems. °· Heart disease or conditions, such as angina or heart failure. °· Low red blood cells (anemia). °· Poor physical fitness, which can cause shortness of breath when you exercise. °· Chest or back injuries or stiffness. °· Being overweight. °· Smoking. °· Anxiety, which can make you feel like you are not getting enough air. °DIAGNOSIS  °Serious medical problems can often be found during your physical exam. Tests may also be done to determine why you are having shortness of breath. Tests may include: °· Chest X-rays. °· Lung function tests. °· Blood tests. °· An electrocardiogram (  ECG). °· An ambulatory electrocardiogram. An ambulatory ECG records your heartbeat patterns over a 24-hour period. °· Exercise testing. °· A transthoracic echocardiogram (TTE). During echocardiography, sound waves are used to evaluate how blood flows through your heart. °· A transesophageal echocardiogram (TEE). °· Imaging scans. °Your health care provider may not be able to find a cause for your  shortness of breath after your exam. In this case, it is important to have a follow-up exam with your health care provider as directed.  °TREATMENT  °Treatment for shortness of breath depends on the cause of your symptoms and can vary greatly. °HOME CARE INSTRUCTIONS  °· Do not smoke. Smoking is a common cause of shortness of breath. If you smoke, ask for help to quit. °· Avoid being around chemicals or things that may bother your breathing, such as paint fumes and dust. °· Rest as needed. Slowly resume your usual activities. °· If medicines were prescribed, take them as directed for the full length of time directed. This includes oxygen and any inhaled medicines. °· Keep all follow-up appointments as directed by your health care provider. °SEEK MEDICAL CARE IF:  °· Your condition does not improve in the time expected. °· You have a hard time doing your normal activities even with rest. °· You have any new symptoms. °SEEK IMMEDIATE MEDICAL CARE IF:  °· Your shortness of breath gets worse. °· You feel light-headed, faint, or develop a cough not controlled with medicines. °· You start coughing up blood. °· You have pain with breathing. °· You have chest pain or pain in your arms, shoulders, or abdomen. °· You have a fever. °· You are unable to walk up stairs or exercise the way you normally do. °MAKE SURE YOU: °· Understand these instructions. °· Will watch your condition. °· Will get help right away if you are not doing well or get worse. °  °This information is not intended to replace advice given to you by your health care provider. Make sure you discuss any questions you have with your health care provider. °  °Document Released: 10/21/2000 Document Revised: 01/31/2013 Document Reviewed: 04/13/2011 °Elsevier Interactive Patient Education ©2016 Elsevier Inc. ° °

## 2015-02-11 NOTE — ED Notes (Signed)
Pt. Going home with wife. 

## 2015-02-12 ENCOUNTER — Ambulatory Visit: Payer: Self-pay | Admitting: Family Medicine

## 2015-02-19 ENCOUNTER — Encounter: Payer: Self-pay | Admitting: Family Medicine

## 2015-02-19 DIAGNOSIS — M1712 Unilateral primary osteoarthritis, left knee: Secondary | ICD-10-CM | POA: Insufficient documentation

## 2015-03-01 ENCOUNTER — Ambulatory Visit (INDEPENDENT_AMBULATORY_CARE_PROVIDER_SITE_OTHER): Payer: Medicaid Other | Admitting: Family Medicine

## 2015-03-01 ENCOUNTER — Encounter: Payer: Self-pay | Admitting: Family Medicine

## 2015-03-01 VITALS — BP 134/82 | HR 80 | Temp 97.7°F | Resp 16 | Ht 67.0 in | Wt 230.8 lb

## 2015-03-01 DIAGNOSIS — Z9889 Other specified postprocedural states: Secondary | ICD-10-CM

## 2015-03-01 DIAGNOSIS — M25522 Pain in left elbow: Secondary | ICD-10-CM | POA: Diagnosis not present

## 2015-03-01 DIAGNOSIS — M24529 Contracture, unspecified elbow: Secondary | ICD-10-CM | POA: Insufficient documentation

## 2015-03-01 DIAGNOSIS — M62549 Muscle wasting and atrophy, not elsewhere classified, unspecified hand: Secondary | ICD-10-CM | POA: Insufficient documentation

## 2015-03-01 DIAGNOSIS — M62542 Muscle wasting and atrophy, not elsewhere classified, left hand: Secondary | ICD-10-CM | POA: Diagnosis not present

## 2015-03-01 DIAGNOSIS — M24522 Contracture, left elbow: Secondary | ICD-10-CM

## 2015-03-01 MED ORDER — DICLOFENAC SODIUM 1 % TD GEL: 4.0000 g | Freq: Four times a day (QID) | TRANSDERMAL | Status: DC

## 2015-03-01 NOTE — Progress Notes (Signed)
Name: Angel Cruz   MRN: [id number]    DOB: 15-Feb-1955   Date:03/01/2015       Progress Note  Subjective  Chief Complaint  Chief Complaint  Patient presents with  . Elbow Pain    patient presents with left elbow pain, numbness and swelling since Sunday evening. hx of surgery on the same elbow years ago. patient has been wearing a brace.    HPI  Elbow Pain: he states he had surgery of left elbow about 15 years ago, he states it was for chipped bones. He had some damage of ulnar nerve and developed hand atrophy, also inability to stretch his elbow completely even after he finished PT. He states that over the past 5 days he has noticed swelling around left elbow and pain with movement, no redness, no fever.  He was recently seen by Dr. Tamala Julian for left knee pain and was given Meloxicam, however nephrologist ( Dr. Abigail Butts ) advised him to take only for one week and stop for 2 weeks. He will have to stop taking medication today. He is worried that the pain on his elbow will get worse.  Patient Active Problem List   Diagnosis Date Noted  . Contracture, elbow 03/01/2015  . Atrophy of hand muscles 03/01/2015  . History of elbow surgery 03/01/2015  . Osteoarthritis of left knee 02/19/2015  . Acute on chronic renal failure ([place]) 01/16/2015  . 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 Insomnia 11/12/2014  . Hypertension, benign 10/31/2014  . OSA (obstructive sleep apnea) 09/28/2014  . Anemia of chronic disease 09/14/2014  . Cardiomyopathy, ischemic   . Orthostatic hypotension 09/07/2014  . Angioedema 07/30/2014  . RLS (restless legs syndrome) 07/30/2014  . B12 deficiency 07/30/2014  . Vitamin D deficiency 07/30/2014  . Chronic systolic heart failure ([place]) 11/14/2013  . Ischemic  cardiomyopathy   . CAD (coronary artery disease)   . Hyperlipidemia   . Uncontrolled diabetes mellitus with stage 3 chronic kidney disease, with long-term current use of insulin ([place])   . Major depression, recurrent ([place])   . CKD (chronic kidney disease), stage III     Past Surgical History  Procedure Laterality Date  . Cardiac catheterization  10/19/2013    [facility]  . Uvulectomy  1995  . Atrial fibrillation ablation  1995  . Elbow surgery Left   . Rotator cuff repair    . Carpal tunnel release      Family History  Problem Relation Age of Onset  . Stroke Mother   . COPD Mother   . Heart failure Mother   . Heart attack Mother   . Colon cancer Father   . Diabetes Father   . Diabetes Brother   . Cancer Brother     Social History   Social History  . Marital Status: Married    Spouse Name: [name]  . Number of Children: 2  . Years of Education: N/A   Occupational History  . Not on file.   Social History Main Topics  . Smoking status: Never Smoker   . Smokeless tobacco: Never Used  . Alcohol Use: No  . Drug Use: No  . Sexual Activity: No   Other Topics Concern  . Not on file   Social History Narrative     Current outpatient prescriptions:  .  amLODipine ([place]) 5  MG tablet, Take 1 tablet (5 mg total) by mouth daily., Disp: 30 tablet, Rfl: 6 .  aspirin EC 81 MG tablet, Take 81 mg by mouth daily., Disp: , Rfl:  .  B-D UF III MINI PEN NEEDLES 31G X 5 MM MISC, USE AS DIRECTED ONCE DAILY, Disp: 100 each, Rfl: 0 .  carvedilol (COREG) 6.25 MG tablet, TAKE 1 TABLET(6.25 MG) BY MOUTH TWICE DAILY WITH A MEAL, Disp: 180 tablet, Rfl: 3 .  CRESTOR 20 MG tablet, TAKE 1 TABLET(20 MG) BY MOUTH AT BEDTIME, Disp: 30 tablet, Rfl: 5 .  Cyanocobalamin (VITAMIN B-12 PO), Take 1 tablet by mouth daily., Disp: , Rfl:  .  escitalopram (LEXAPRO) 10 MG tablet, Take 1 tablet (10 mg total) by mouth daily., Disp: 90 tablet, Rfl: 1 .  Insulin Degludec (TRESIBA FLEXTOUCH) 200 UNIT/ML SOPN,  Inject 8 Units into the skin daily. (Patient taking differently: Inject 3 Units into the skin daily. ), Disp: 5 pen, Rfl: 2 .  meloxicam (MOBIC) 15 MG tablet, TAKE 1 TABLET(S) EVERY DAY BY ORAL ROUTE., Disp: , Rfl: 2 .  nitroGLYCERIN (NITROSTAT) 0.4 MG SL tablet, Place 0.4 mg under the tongue every 5 (five) minutes as needed for chest pain., Disp: , Rfl:  .  rOPINIRole (REQUIP) 0.5 MG tablet, TAKE 1 TABLET(0.5 MG) BY MOUTH AT BEDTIME, Disp: 30 tablet, Rfl: 5 .  sodium bicarbonate 650 MG tablet, Take 1 tablet (650 mg total) by mouth 2 (two) times daily., Disp: 60 tablet, Rfl: 0 .  traZODone (DESYREL) 50 MG tablet, TAKE 1/2 TO 1 AND 1/2 TABLETS(25 TO 75 MG) BY MOUTH AT BEDTIME AS NEEDED FOR SLEEP, Disp: 135 tablet, Rfl: 0 .  Vitamin D, Ergocalciferol, (DRISDOL) 50000 units CAPS capsule, TK ONE C PO  Q WEEK, Disp: , Rfl: 1  Allergies  Allergen Reactions  . Ace Inhibitors     Kidney failure  . Valsartan Other (See Comments)    Kidney failure     ROS  Ten systems reviewed and is negative except as mentioned in HPI   Objective  Filed Vitals:   03/01/15 0925  BP: 134/82  Pulse: 80  Temp: 97.7 F (36.5 C)  TempSrc: Oral  Resp: 16  Height: '5\' 7"'$  (1.702 m)  Weight: 230 lb 12.8 oz (104.69 kg)  SpO2: 96%    Body mass index is 36.14 kg/(m^2).  Physical Exam  Constitutional: Patient appears well-developed and well-nourished. Obese No distress.  HEENT: head atraumatic, normocephalic, pupils equal and reactive to light,neck supple, throat within normal limits Cardiovascular: Normal rate, regular rhythm and normal heart sounds.  No murmur heard. No BLE edema. Pulmonary/Chest: Effort normal and breath sounds normal. No respiratory distress. Abdominal: Soft.  There is no tenderness. Psychiatric: Patient has a normal mood and affect. behavior is normal. Judgment and thought content normal. Muscular Skeletal: atrophy of dorsal hand muscles, contraction of left elbow, no redness or increase  in warmth, but some diffuse swelling around left elbow. Pain with pronation and supination of left elbow, pain during palpation of lateral epicondyle.   Recent Results (from the past 2160 hour(s))  Basic metabolic panel     Status: Abnormal   Collection Time: 01/16/15  4:07 PM  Result Value Ref Range   Sodium 139 135 - 145 mmol/L   Potassium 5.6 (H) 3.5 - 5.1 mmol/L   Chloride 108 101 - 111 mmol/L   CO2 24 22 - 32 mmol/L   Glucose, Bld 193 (H) 65 - 99 mg/dL  BUN 32 (H) 6 - 20 mg/dL   Creatinine, Ser 2.17 (H) 0.61 - 1.24 mg/dL   Calcium 9.5 8.9 - 10.3 mg/dL   GFR calc non Af Amer 32 (L) >60 mL/min   GFR calc Af Amer 37 (L) >60 mL/min    Comment: (NOTE) The eGFR has been calculated using the CKD EPI equation. This calculation has not been validated in all clinical situations. eGFR's persistently <60 mL/min signify possible Chronic Kidney Disease.    Anion gap 7 5 - 15  CBC     Status: Abnormal   Collection Time: 01/16/15  4:07 PM  Result Value Ref Range   WBC 6.7 3.8 - 10.6 K/uL   RBC 3.63 (L) 4.40 - 5.90 MIL/uL   Hemoglobin 10.7 (L) 13.0 - 18.0 g/dL   HCT 31.7 (L) 40.0 - 52.0 %   MCV 87.4 80.0 - 100.0 fL   MCH 29.4 26.0 - 34.0 pg   MCHC 33.7 32.0 - 36.0 g/dL   RDW 16.8 (H) 11.5 - 14.5 %   Platelets 178 150 - 440 K/uL  Troponin I     Status: None   Collection Time: 01/16/15  4:07 PM  Result Value Ref Range   Troponin I <0.03 <0.031 ng/mL    Comment:        NO INDICATION OF MYOCARDIAL INJURY.   Fibrin derivatives D-Dimer     Status: Abnormal   Collection Time: 01/16/15  4:07 PM  Result Value Ref Range   Fibrin derivatives D-dimer ([facility]) 879 (H) 0 - 499    Comment: <> Exclusion of Venous Thromboembolism (VTE) - OUTPATIENTS ONLY        (Emergency Department or [place])             0-499 ng/ml (FEU)  : With a low to intermediate pretest                                        probability for VTE this test result                                        excludes the  diagnosis of VTE.           > 499 ng/ml (FEU)  : VTE not excluded.  Additional work up                                   for VTE is required.   <>  Testing on Inpatients and Evaluation of Disseminated Intravascular        Coagulation (DIC)             Reference Range:   0-499 ng/ml (FEU)   Brain natriuretic peptide     Status: Abnormal   Collection Time: 01/16/15  4:07 PM  Result Value Ref Range   B Natriuretic Peptide 203.0 (H) 0.0 - 100.0 pg/mL  Glucose, capillary     Status: Abnormal   Collection Time: 01/16/15 11:13 PM  Result Value Ref Range   Glucose-Capillary 111 (H) 65 - 99 mg/dL   Comment 1 Notify RN   Basic metabolic panel     Status: Abnormal   Collection Time: 01/17/15  6:02 AM  Result Value Ref Range  Sodium 139 135 - 145 mmol/L   Potassium 4.2 3.5 - 5.1 mmol/L   Chloride 110 101 - 111 mmol/L   CO2 26 22 - 32 mmol/L   Glucose, Bld 105 (H) 65 - 99 mg/dL   BUN 30 (H) 6 - 20 mg/dL   Creatinine, Ser 1.78 (H) 0.61 - 1.24 mg/dL   Calcium 8.4 (L) 8.9 - 10.3 mg/dL   GFR calc non Af Amer 41 (L) >60 mL/min   GFR calc Af Amer 47 (L) >60 mL/min    Comment: (NOTE) The eGFR has been calculated using the CKD EPI equation. This calculation has not been validated in all clinical situations. eGFR's persistently <60 mL/min signify possible Chronic Kidney Disease.    Anion gap 3 (L) 5 - 15  CBC     Status: Abnormal   Collection Time: 01/17/15  6:02 AM  Result Value Ref Range   WBC 7.4 3.8 - 10.6 K/uL   RBC 3.31 (L) 4.40 - 5.90 MIL/uL   Hemoglobin 9.9 (L) 13.0 - 18.0 g/dL   HCT 29.3 (L) 40.0 - 52.0 %   MCV 88.6 80.0 - 100.0 fL   MCH 30.1 26.0 - 34.0 pg   MCHC 33.9 32.0 - 36.0 g/dL   RDW 16.6 (H) 11.5 - 14.5 %   Platelets 154 150 - 440 K/uL  Glucose, capillary     Status: None   Collection Time: 01/17/15  7:34 AM  Result Value Ref Range   Glucose-Capillary 94 65 - 99 mg/dL   Comment 1 Notify RN   Hemoglobin A1c     Status: None   Collection Time: 02/05/15  3:13 PM   Result Value Ref Range   Hgb A1c MFr Bld 6.0 4.0 - 6.0 %  Comprehensive metabolic panel     Status: Abnormal   Collection Time: 02/05/15  3:13 PM  Result Value Ref Range   Sodium 140 135 - 145 mmol/L   Potassium 4.8 3.5 - 5.1 mmol/L   Chloride 110 101 - 111 mmol/L   CO2 26 22 - 32 mmol/L   Glucose, Bld 168 (H) 65 - 99 mg/dL   BUN 41 (H) 6 - 20 mg/dL   Creatinine, Ser 1.73 (H) 0.61 - 1.24 mg/dL   Calcium 8.9 8.9 - 10.3 mg/dL   Total Protein 6.5 6.5 - 8.1 g/dL   Albumin 3.3 (L) 3.5 - 5.0 g/dL   AST 20 15 - 41 U/L   ALT 16 (L) 17 - 63 U/L   Alkaline Phosphatase 59 38 - 126 U/L   Total Bilirubin 0.2 (L) 0.3 - 1.2 mg/dL   GFR calc non Af Amer 42 (L) >60 mL/min   GFR calc Af Amer 49 (L) >60 mL/min    Comment: (NOTE) The eGFR has been calculated using the CKD EPI equation. This calculation has not been validated in all clinical situations. eGFR's persistently <60 mL/min signify possible Chronic Kidney Disease.    Anion gap 4 (L) 5 - 15  CBC with Differential/Platelet     Status: Abnormal   Collection Time: 02/05/15  3:13 PM  Result Value Ref Range   WBC 7.8 3.8 - 10.6 K/uL   RBC 3.92 (L) 4.40 - 5.90 MIL/uL   Hemoglobin 11.1 (L) 13.0 - 18.0 g/dL   HCT 33.6 (L) 40.0 - 52.0 %   MCV 85.8 80.0 - 100.0 fL   MCH 28.3 26.0 - 34.0 pg   MCHC 32.9 32.0 - 36.0 g/dL   RDW 15.6 (H) 11.5 -  14.5 %   Platelets 193 150 - 440 K/uL   Neutrophils Relative % 73 %   Neutro Abs 5.7 1.4 - 6.5 K/uL   Lymphocytes Relative 17 %   Lymphs Abs 1.4 1.0 - 3.6 K/uL   Monocytes Relative 4 %   Monocytes Absolute 0.3 0.2 - 1.0 K/uL   Eosinophils Relative 5 %   Eosinophils Absolute 0.4 0 - 0.7 K/uL   Basophils Relative 1 %   Basophils Absolute 0.1 0 - 0.1 K/uL  Ferritin     Status: None   Collection Time: 02/05/15  3:13 PM  Result Value Ref Range   Ferritin 79 24 - 336 ng/mL  Vitamin B12     Status: Abnormal   Collection Time: 02/06/15  2:07 PM  Result Value Ref Range   Vitamin B-12 1032 (H) 180 - 914  pg/mL    Comment: (NOTE) This assay is not validated for testing neonatal or myeloproliferative syndrome specimens for Vitamin B12 levels. Performed at [facility]   VITAMIN D 25 Hydroxy (Vit-D Deficiency, Fractures)     Status: Abnormal   Collection Time: 02/06/15  2:07 PM  Result Value Ref Range   Vit D, 25-Hydroxy 12.1 (L) 30.0 - 100.0 ng/mL    Comment: (NOTE) Vitamin D deficiency has been defined by the Institute of Medicine and an Endocrine Society practice guideline as a level of serum 25-OH vitamin D less than 20 ng/mL (1,2). The Endocrine Society went on to further define vitamin D insufficiency as a level between 21 and 29 ng/mL (2). 1. IOM (Institute of Medicine). 2010. Dietary reference   intakes for calcium and D. [place]: The   [employer]. 2. Holick MF, Binkley [place], Bischoff-Ferrari HA, et al.   Evaluation, treatment, and prevention of vitamin D   deficiency: an Endocrine Society clinical practice   guideline. JCEM. 2011 Jul; 96(7):1911-30. Performed At: [facility] [address] [place], [place] [id number] Mila Homer MD [id number]   Parathyroid hormone, intact (no Ca)     Status: Abnormal   Collection Time: 02/06/15  2:07 PM  Result Value Ref Range   PTH 74 (H) 15 - 65 pg/mL    Comment: (NOTE) Performed At: [facility] [address] [place], [place] [id number] Mila Homer MD [id number]   Basic metabolic panel     Status: Abnormal   Collection Time: 02/10/15  9:18 PM  Result Value Ref Range   Sodium 141 135 - 145 mmol/L   Potassium 4.8 3.5 - 5.1 mmol/L   Chloride 112 (H) 101 - 111 mmol/L   CO2 24 22 - 32 mmol/L   Glucose, Bld 156 (H) 65 - 99 mg/dL   BUN 45 (H) 6 - 20 mg/dL   Creatinine, Ser 3.56 (H) 0.61 - 1.24 mg/dL   Calcium 9.2 8.9 - 70.1 mg/dL   GFR calc non Af Amer 35 (L) >60 mL/min   GFR calc Af Amer 40 (L) >60 mL/min    Comment: (NOTE) The eGFR has been calculated using the CKD EPI  equation. This calculation has not been validated in all clinical situations. eGFR's persistently <60 mL/min signify possible Chronic Kidney Disease.    Anion gap 5 5 - 15  CBC     Status: Abnormal   Collection Time: 02/10/15  9:18 PM  Result Value Ref Range   WBC 7.6 3.8 - 10.6 K/uL   RBC 3.65 (L) 4.40 - 5.90 MIL/uL   Hemoglobin 10.8 (L) 13.0 -  18.0 g/dL   HCT 31.7 (L) 40.0 - 52.0 %   MCV 87.0 80.0 - 100.0 fL   MCH 29.7 26.0 - 34.0 pg   MCHC 34.1 32.0 - 36.0 g/dL   RDW 16.2 (H) 11.5 - 14.5 %   Platelets 196 150 - 440 K/uL  Troponin I     Status: None   Collection Time: 02/10/15  9:18 PM  Result Value Ref Range   Troponin I <0.03 <0.031 ng/mL    Comment:        NO INDICATION OF MYOCARDIAL INJURY.   Brain natriuretic peptide     Status: Abnormal   Collection Time: 02/11/15 12:06 AM  Result Value Ref Range   B Natriuretic Peptide 266.0 (H) 0.0 - 100.0 pg/mL  Troponin I     Status: None   Collection Time: 02/11/15  2:26 AM  Result Value Ref Range   Troponin I <0.03 <0.031 ng/mL    Comment:        NO INDICATION OF MYOCARDIAL INJURY.    PHQ2/9: Depression screen [facility] 2/9 03/01/2015 02/05/2015 12/06/2014 10/10/2014 07/30/2014  Decreased Interest 0 0 1 0 1  Down, Depressed, Hopeless 0 0 1 0 1  PHQ - 2 Score 0 0 2 0 2  Altered sleeping - - 0 - 3  Tired, decreased energy - - 1 - 2  Change in appetite - - 0 - 2  Feeling bad or failure about yourself  - - 0 - 1  Trouble concentrating - - 0 - 1  Moving slowly or fidgety/restless - - 0 - 2  Suicidal thoughts - - 0 - 0  PHQ-9 Score - - 3 - 13  Difficult doing work/chores - - Somewhat difficult - Somewhat difficult    Fall Risk: Fall Risk  03/01/2015 02/05/2015 12/06/2014 10/10/2014 07/30/2014  Falls in the past year? Yes Yes No No No  Number falls in past yr: 2 or more 2 or more - - -  Injury with Fall? No No - - -      Functional Status Survey: Is the patient deaf or have difficulty hearing?: No Does the patient have  difficulty seeing, even when wearing glasses/contacts?: No Does the patient have difficulty concentrating, remembering, or making decisions?: No Does the patient have difficulty walking or climbing stairs?: No Does the patient have difficulty dressing or bathing?: No Does the patient have difficulty doing errands alone such as visiting a doctor's office or shopping?: No    Assessment & Plan  1. Left elbow pain  - Ambulatory referral to Orthopedic Surgery - diclofenac sodium (VOLTAREN) 1 % GEL; Apply 4 g topically 4 (four) times daily.  Dispense: 100 g; Refill: 0 We will try Voltaren gel in place of Meloxicam since he has CKI - it can be used for elbow and also for left knee OA It does not seem to be an infected joint since his glucose has been under control and there is no redness, pain only with deep palpation, seems to be tendinitis, may need steroid injection.   2. 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 History of elbow surgery   4. Contracture, elbow, left  Chronic

## 2015-03-18 ENCOUNTER — Ambulatory Visit (INDEPENDENT_AMBULATORY_CARE_PROVIDER_SITE_OTHER): Payer: Medicaid Other | Admitting: Cardiovascular Disease

## 2015-03-18 ENCOUNTER — Encounter: Payer: Self-pay | Admitting: Cardiovascular Disease

## 2015-03-18 VITALS — BP 130/80 | HR 72 | Ht 67.0 in | Wt 233.8 lb

## 2015-03-18 DIAGNOSIS — I2510 Atherosclerotic heart disease of native coronary artery without angina pectoris: Secondary | ICD-10-CM

## 2015-03-18 DIAGNOSIS — I10 Essential (primary) hypertension: Secondary | ICD-10-CM

## 2015-03-18 DIAGNOSIS — I5022 Chronic systolic (congestive) heart failure: Secondary | ICD-10-CM | POA: Diagnosis not present

## 2015-03-18 DIAGNOSIS — E785 Hyperlipidemia, unspecified: Secondary | ICD-10-CM | POA: Diagnosis not present

## 2015-03-18 NOTE — Assessment & Plan Note (Signed)
Blood pressure is reasonably controlled on carvedilol and amlodipine. I do not recommend more aggressive blood pressure control given prior history of symptomatic hypotension.

## 2015-03-18 NOTE — Assessment & Plan Note (Signed)
Lab Results  Component Value Date   CHOL 93 09/09/2014   HDL 23* 09/09/2014   LDLCALC 26 09/09/2014   TRIG 220* 09/09/2014   CHOLHDL 4.0 09/09/2014   His LDL was at target. Continue current dose of rosuvastatin.

## 2015-03-18 NOTE — Patient Instructions (Signed)
Medication Instructions: Continue same medications.   Labwork: None.   Procedures/Testing: None.   Follow-Up: 6 months with Dr. Arida.   Any Additional Special Instructions Will Be Listed Below (If Applicable).     If you need a refill on your cardiac medications before your next appointment, please call your pharmacy.   

## 2015-03-18 NOTE — Assessment & Plan Note (Signed)
Most recent ejection fraction was normal. Continue carvedilol. No ACE inhibitor is oriented these are recommended due to recurrent episodes of acute on chronic renal failure.

## 2015-03-18 NOTE — Progress Notes (Signed)
Primary care physician: Dr. Carlynn Purl Nephrologist: Dr. Wynelle Link  HPI   60 year old male who is here today for a followup visit regarding coronary artery disease and chronic systolic heart failure. He was admitted to [facility] in September, 2015 with complaints of cough and chest pain.  He ruled out for myocardial infarction.  He underwent stress testing which revealed apical infarct with peri-infarct ischemia.  EF was 31%.  Echocardiogram showed EF 35-40% with anteroseptal, anterior, apical wall motion abnormalities. He underwent cardiac catheterization which revealed an occluded mid LAD with otherwise nonobstructive CAD.  There were faint right to left collaterals supplying the distal LAD distribution.   He had recurrent hospital admissions for acute on chronic kidney disease with episodes of hypotension of unclear etiology.  He has known diabetic nephropathy with significant proteinuria. Most recent echocardiogram in August 2016 showed normal LV systolic function. He has been doing reasonably well with no chest pain. He has chronic exertional dyspnea. Blood pressure has been reasonably controlled on carvedilol and amlodipine.  Allergies  Allergen Reactions  . Ace Inhibitors     Kidney failure  . Valsartan Other (See Comments)    Kidney failure     Current Outpatient Prescriptions on File Prior to Visit  Medication Sig Dispense Refill  . amLODipine (NORVASC) 5 MG tablet Take 1 tablet (5 mg total) by mouth daily. 30 tablet 6  . aspirin EC 81 MG tablet Take 81 mg by mouth daily.    . B-D UF III MINI PEN NEEDLES 31G X 5 MM MISC USE AS DIRECTED ONCE DAILY 100 each 0  . carvedilol (COREG) 6.25 MG tablet TAKE 1 TABLET(6.25 MG) BY MOUTH TWICE DAILY WITH A MEAL 180 tablet 3  . CRESTOR 20 MG tablet TAKE 1 TABLET(20 MG) BY MOUTH AT BEDTIME 30 tablet 5  . Cyanocobalamin (VITAMIN B-12 PO) Take 1 tablet by mouth daily.    . diclofenac sodium (VOLTAREN) 1 % GEL Apply 4 g topically  4 (four) times daily. 100 g 0  . escitalopram (LEXAPRO) 10 MG tablet Take 1 tablet (10 mg total) by mouth daily. 90 tablet 1  . Insulin Degludec (TRESIBA FLEXTOUCH) 200 UNIT/ML SOPN Inject 8 Units into the skin daily. (Patient taking differently: Inject 3 Units into the skin daily. ) 5 pen 2  . meloxicam (MOBIC) 15 MG tablet TAKE 1 TABLET(S) EVERY DAY BY ORAL ROUTE.  2  . nitroGLYCERIN (NITROSTAT) 0.4 MG SL tablet Place 0.4 mg under the tongue every 5 (five) minutes as needed for chest pain.    Marland Kitchen rOPINIRole (REQUIP) 0.5 MG tablet TAKE 1 TABLET(0.5 MG) BY MOUTH AT BEDTIME 30 tablet 5  . sodium bicarbonate 650 MG tablet Take 1 tablet (650 mg total) by mouth 2 (two) times daily. 60 tablet 0  . traZODone (DESYREL) 50 MG tablet TAKE 1/2 TO 1 AND 1/2 TABLETS(25 TO 75 MG) BY MOUTH AT BEDTIME AS NEEDED FOR SLEEP 135 tablet 0  . Vitamin D, Ergocalciferol, (DRISDOL) 50000 units CAPS capsule TK ONE C PO  Q WEEK  1   No current facility-administered medications on file prior to visit.     Past Medical History  Diagnosis Date  . Diabetes mellitus without complication (HCC)   . Hypertension   . Hyperlipidemia   . Depression   . Rotator cuff tear   . CKD (chronic kidney disease), stage III   . Ischemic cardiomyopathy     a. 10/2013 Echo: EF 35-40%, severe distal anteroseptal, anterior, and apical HK.  Diast dysfxn, mild conc LVH, mildly dil LA, mild Ao sclerosis w/o stenosis.  Marland Kitchen CAD (coronary artery disease)     a. 10/2013 Lexi MV: EF 31%, apical, septal, ant-apical, inf-apical, lat-apical scar, septal and apical peri-infarct ischemia;  b. 10/2013 Cath: LM nl, LAD 20ost, 180m (faint R->L collats), D1 80p, LCX min irregs, OM1 min irregs, OM2 nl, OM3 30p, RCA 20p, PDA 60, RPL min irregs-->Med Rx.  . Morbid obesity (HCC)   . CHF (congestive heart failure) (HCC)   . OSA (obstructive sleep apnea)   . Sleep apnea     recent test confirmed diagnosis, was performed at the sleep center across the street  .  Hypotension      Past Surgical History  Procedure Laterality Date  . Cardiac catheterization  10/19/2013    [facility]  . Uvulectomy  1995  . Atrial fibrillation ablation  1995  . Elbow surgery Left   . Rotator cuff repair    . Carpal tunnel release       Family History  Problem Relation Age of Onset  . Stroke Mother   . COPD Mother   . Heart failure Mother   . Heart attack Mother   . Colon cancer Father   . Diabetes Father   . Diabetes Brother   . Cancer Brother      Social History   Social History  . Marital Status: Married    Spouse Name: Angelique Blonder  . Number of Children: 2  . Years of Education: N/A   Occupational History  . Not on file.   Social History Main Topics  . Smoking status: Never Smoker   . Smokeless tobacco: Never Used  . Alcohol Use: No  . Drug Use: No  . Sexual Activity: No   Other Topics Concern  . Not on file   Social History Narrative     PHYSICAL EXAM   BP 130/80 mmHg  Pulse 72  Ht 5\' 7"  (1.702 m)  Wt 233 lb 12 oz (106.028 kg)  BMI 36.60 kg/m2 Constitutional: He is oriented to person, place, and time. He appears well-developed and well-nourished. No distress.  HENT: No nasal discharge.  Head: Normocephalic and atraumatic.  Eyes: Pupils are equal and round.  No discharge. Neck: Normal range of motion. Neck supple. No JVD present. No thyromegaly present.  Cardiovascular: Normal rate, regular rhythm, normal heart sounds. Exam reveals no gallop and no friction rub. No murmur heard.  Pulmonary/Chest: Effort normal and breath sounds normal. No stridor. No respiratory distress. He has no wheezes. He has no rales. He exhibits no tenderness.  Abdominal: Soft. Bowel sounds are normal. He exhibits no distension. There is no tenderness. There is no rebound and no guarding.  Musculoskeletal: Normal range of motion. He exhibits no edema and no tenderness.  Neurological: He is alert and oriented to person, place, and time. Coordination normal.    Skin: Skin is warm and dry. No rash noted. He is not diaphoretic. No erythema. No pallor.  Psychiatric: He has a normal mood and affect. His behavior is normal. Judgment and thought content normal.      ASSESSMENT AND PLAN

## 2015-03-18 NOTE — Assessment & Plan Note (Signed)
He is doing well overall with no symptoms suggestive of angina. Continue medical therapy. 

## 2015-03-29 ENCOUNTER — Telehealth: Payer: Self-pay

## 2015-03-29 NOTE — Telephone Encounter (Signed)
Received clearance from [facility]. Pt having carpel tunnel release Feb 27 Clearance placed in MD in basket

## 2015-04-01 ENCOUNTER — Encounter: Payer: Self-pay | Admitting: Family Medicine

## 2015-04-01 DIAGNOSIS — G5602 Carpal tunnel syndrome, left upper limb: Secondary | ICD-10-CM | POA: Insufficient documentation

## 2015-04-01 DIAGNOSIS — G5622 Lesion of ulnar nerve, left upper limb: Secondary | ICD-10-CM | POA: Insufficient documentation

## 2015-04-03 ENCOUNTER — Encounter: Payer: Self-pay | Admitting: Family Medicine

## 2015-04-04 ENCOUNTER — Telehealth: Payer: Self-pay

## 2015-04-04 ENCOUNTER — Other Ambulatory Visit: Payer: Self-pay | Admitting: Specialist

## 2015-04-04 NOTE — Telephone Encounter (Signed)
Pt c/o swelling: STAT is pt has developed SOB within 24 hours  1. How long have you been experiencing swelling? Last 48 hours  2. Where is the swelling located? Bilateral ankles and legs, more in left than right  3.  Are you currently taking a "fluid pill"?no  4.  Are you currently SOB? No, but some cough and wheezing  5.  Have you traveled recently?no

## 2015-04-04 NOTE — Telephone Encounter (Signed)
Pt and pt wife, Angelique Blonder, presented to the office asking for someone to assess pt bilateral lower extremity swelling.  I advised pt that we were unable to assess pt at this time and offered a nurse visit tomorrow morning. Advised pt that if sx worsen or he becomes SOB to proceed to the ER. Pt states he has no SOB at this time and does not want to go to ER if at all possible d/t the expected wait time. They will continue to monitor s/s and are agreeable with Feb 24 8:15am OV. Pt and wife had no further questions.

## 2015-04-05 ENCOUNTER — Telehealth: Payer: Self-pay | Admitting: Cardiovascular Disease

## 2015-04-05 ENCOUNTER — Ambulatory Visit (INDEPENDENT_AMBULATORY_CARE_PROVIDER_SITE_OTHER): Payer: Medicaid Other

## 2015-04-05 VITALS — BP 122/80 | HR 59 | Resp 16 | Ht 67.0 in | Wt 233.1 lb

## 2015-04-05 DIAGNOSIS — R600 Localized edema: Secondary | ICD-10-CM

## 2015-04-05 NOTE — Patient Instructions (Signed)
1.) Reason for visit: Lower extremity edema  2.) Name of person requesting visit: Pt/RN   3.) H&P: Pt has history of CHF, CKD Stage 3. Pt called yesterday concerned of bilateral lower extremity edema. Within the next hour, he and his wife presented to the office asking to be seen. He was advised to make a nurse visit for today and instructed to monitor symptoms and proceed to ER if s/s worsen.   4.) ROS related to problem: Pt states LEE was first noticed by his wife on Tuesday. He reports being slightly SOB this morning while laying flat but was able to slept 10pm-5:30am with no difficulty breathing. His wife helps him follow a low sodium diet. Reviewed his dietary intake over the past few days. He did eat a frozen pizza this week which I advised him again d/t sodium content. His lower extremities are tight, equally edematous, causing him no pain. He reports improvement in swelling when he is asleep as legs are elevated but when he stands up, they feel tight. BP is controlled however, he has gained 7lbs since 03/18/15. He does not take lasix and has CKD. Advised pt to elevate legs throughout the day, continue all medications as prescribed, monitor symptoms, follow a low sodium diet, and wait for further instruction from MD.    5.) Assessment and plan per MD: Forward to Dr. Kirke Corin to advise.

## 2015-04-05 NOTE — Telephone Encounter (Signed)
S/w pt wife, Angelique Blonder, to inform her I have not received a response from MD.  Advised her to continue monitoring pt symptoms, limit his fluid intake, follow low sodium diet, have him elevate legs and suggested compression hose. I will CB with further instructions from MD. Angelique Blonder appreciative of the call and verbalized understanding to have pt be seen emergently if sx worsen or pt has difficulty breathing.

## 2015-04-05 NOTE — Telephone Encounter (Signed)
Patient still waiting on advise treatment plan from nurse visit today.  Please call patient.

## 2015-04-08 ENCOUNTER — Telehealth: Payer: Self-pay

## 2015-04-08 NOTE — Telephone Encounter (Signed)
Per verbal from Dr. Kirke Corin after nurse visit, have pt continue to follow low sodium diet and wear compression stockings.  S/w pt regarding recommendations. Pt reports improved LEE over the weekend. He was able to elevate legs frequently and is now wearing compression stockings. He understands to weigh himself daily and report is edema worsens.  Pt agreeable w/plan with no further questions.

## 2015-04-09 ENCOUNTER — Telehealth: Payer: Self-pay

## 2015-04-09 NOTE — Telephone Encounter (Signed)
Clearance faxed to [facility], [phone]

## 2015-04-10 ENCOUNTER — Encounter
Admission: RE | Admit: 2015-04-10 | Discharge: 2015-04-10 | Disposition: A | Discharge status: Home / Self Care | Payer: Medicaid Other | Source: Ambulatory Visit | Attending: Specialist | Admitting: Specialist

## 2015-04-10 DIAGNOSIS — Z01812 Encounter for preprocedural laboratory examination: Secondary | ICD-10-CM | POA: Insufficient documentation

## 2015-04-10 HISTORY — DX: Acute myocardial infarction, unspecified: I21.9

## 2015-04-10 LAB — BASIC METABOLIC PANEL
ANION GAP: 5 (ref 5–15)
BUN: 36 mg/dL — ABNORMAL HIGH (ref 6–20)
CHLORIDE: 109 mmol/L (ref 101–111)
CO2: 26 mmol/L (ref 22–32)
Calcium: 8.9 mg/dL (ref 8.9–10.3)
Creatinine, Ser: 2.04 mg/dL — ABNORMAL HIGH (ref 0.61–1.24)
GFR calc non Af Amer: 34 mL/min — ABNORMAL LOW (ref >60)
GFR, EST AFRICAN AMERICAN: 40 mL/min — AB (ref >60)
Glucose, Bld: 167 mg/dL — ABNORMAL HIGH (ref 65–99)
POTASSIUM: 4.1 mmol/L (ref 3.5–5.1)
SODIUM: 140 mmol/L (ref 135–145)

## 2015-04-10 LAB — CBC
HEMATOCRIT: 31.1 % — AB (ref 40.0–52.0)
HEMOGLOBIN: 10.5 g/dL — AB (ref 13.0–18.0)
MCH: 28 pg (ref 26.0–34.0)
MCHC: 33.7 g/dL (ref 32.0–36.0)
MCV: 83.1 fL (ref 80.0–100.0)
Platelets: 154 10*3/uL (ref 150–440)
RBC: 3.74 MIL/uL — AB (ref 4.40–5.90)
RDW: 14.7 % — ABNORMAL HIGH (ref 11.5–14.5)
WBC: 5.2 10*3/uL (ref 3.8–10.6)

## 2015-04-10 NOTE — Patient Instructions (Signed)
  Your procedure is scheduled on: 04/17/15 Wed Report to Day Surgery.2nd floor medical mall To find out your arrival time please call [phone] between 1PM - 3PM on 04/16/15 Tues  Remember: Instructions that are not followed completely may result in serious medical risk, up to and including death, or upon the discretion of your surgeon and anesthesiologist your surgery may need to be rescheduled.    __x__ 1. Do not eat food or drink liquids after midnight. No gum chewing or hard candies.     ____ 2. No Alcohol for 24 hours before or after surgery.   ____ 3. Bring all medications with you on the day of surgery if instructed.    __x__ 4. Notify your doctor if there is any change in your medical condition     (cold, fever, infections).     Do not wear jewelry, make-up, hairpins, clips or nail polish.  Do not wear lotions, powders, or perfumes. You may wear deodorant.  Do not shave 48 hours prior to surgery. Men may shave face and neck.  Do not bring valuables to the hospital.    [facility] is not responsible for any belongings or valuables.               Contacts, dentures or bridgework may not be worn into surgery.  Leave your suitcase in the car. After surgery it may be brought to your room.  For patients admitted to the hospital, discharge time is determined by your                treatment team.   Patients discharged the day of surgery will not be allowed to drive home.   Please read over the following fact sheets that you were given:      ___x_ Take these medicines the morning of surgery with A SIP OF WATER:    1. amLODipine (NORVASC) 5 MG tablet  2.   3.   4.  5.  6.  ____ Fleet Enema (as directed)   _x___ Use CHG Soap as directed  ____ Use inhalers on the day of surgery  ____ Stop metformin 2 days prior to surgery    _x__ Take 1/2 of usual insulin dose the night before surgery and none on the morning of surgery.   _x___ Stop Coumadin/Plavix/aspirin on Stop  Aspirin 1 week before surgery  ____ Stop Anti-inflammatories on    ____ Stop supplements until after surgery.    ____ Bring C-Pap to the hospital.

## 2015-04-16 ENCOUNTER — Telehealth: Payer: Self-pay | Admitting: Family Medicine

## 2015-04-16 ENCOUNTER — Encounter: Payer: Self-pay | Admitting: Family Medicine

## 2015-04-16 ENCOUNTER — Ambulatory Visit (INDEPENDENT_AMBULATORY_CARE_PROVIDER_SITE_OTHER): Payer: Medicaid Other | Admitting: Family Medicine

## 2015-04-16 VITALS — BP 152/80 | HR 84 | Temp 97.8°F | Resp 16 | Ht 67.0 in | Wt 248.6 lb

## 2015-04-16 DIAGNOSIS — G5621 Lesion of ulnar nerve, right upper limb: Secondary | ICD-10-CM

## 2015-04-16 DIAGNOSIS — Z794 Long term (current) use of insulin: Secondary | ICD-10-CM

## 2015-04-16 DIAGNOSIS — I10 Essential (primary) hypertension: Secondary | ICD-10-CM

## 2015-04-16 DIAGNOSIS — E1122 Type 2 diabetes mellitus with diabetic chronic kidney disease: Secondary | ICD-10-CM

## 2015-04-16 DIAGNOSIS — G5602 Carpal tunnel syndrome, left upper limb: Secondary | ICD-10-CM

## 2015-04-16 DIAGNOSIS — N183 Chronic kidney disease, stage 3 (moderate): Secondary | ICD-10-CM

## 2015-04-16 MED ORDER — CARVEDILOL 6.25 MG PO TABS: 3.1250 mg | ORAL_TABLET | Freq: Two times a day (BID) | ORAL | Status: AC

## 2015-04-16 NOTE — Telephone Encounter (Signed)
Patient is scheduled have surgery on tomorrow, 04/17/15 @ 7:30am with Dr. Hyacinth Meeker with [facility].  Dr. Rondel Baton office is still needing his surgical clearance form.

## 2015-04-16 NOTE — Progress Notes (Signed)
Name: Angel Cruz   MRN: [id number]    DOB: [birth year]   Date:04/16/2015       Progress Note  Subjective  Chief Complaint  Chief Complaint  Patient presents with  . Follow-up    surgical clearance paperwork  . 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 Sabra Heck and came in today for pre-op clearance , he will have surgery tomorrow. He has seen Dr. Fletcher Anon and had cardiac clearance. He is already off aspirin, and came in for DM follow to make sure he can proceed with surgery.  DMII: he is taking [place] 3 units daily and glucose fasting is between 115-130.  He denies polyuria, polydipsia or polyphagia. He has been taking aspirin ( on hold this past week ) , not on ACE/ARB because of allergic reaction. His last hgbA1C was done 2 months ago and was at goal at 6.9 %   HTN: He is on Norvasc and carvedilol. BP has been better at home, but elevated today. Also seeing Dr. Abigail Butts. He was checked at Dr. Marcy Salvo office one week ago and bp was at goal. He denies chest pain or SOB  Patient Active Problem List   Diagnosis Date Noted  . Cubital tunnel syndrome on left 04/01/2015  . Ulnar tunnel syndrome of left wrist 04/01/2015  . Carpal tunnel syndrome, left 04/01/2015  . Contracture, elbow 03/01/2015  . Atrophy of hand muscles 03/01/2015  . History of elbow surgery 03/01/2015  . Osteoarthritis of left knee 02/19/2015  . Acute on chronic renal failure ([name]) 01/16/2015  . 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 Insomnia 11/12/2014  . Hypertension, benign 10/31/2014  . OSA (obstructive sleep apnea) 09/28/2014  . Anemia of chronic disease 09/14/2014  . Cardiomyopathy, ischemic   . Orthostatic hypotension  09/07/2014  . Angioedema 07/30/2014  . RLS (restless legs syndrome) 07/30/2014  . B12 deficiency 07/30/2014  . Vitamin D deficiency 07/30/2014  . Chronic systolic heart failure ([name]) 11/14/2013  . Ischemic cardiomyopathy   . CAD (coronary artery disease)   . Hyperlipidemia   . Type 2 diabetes with stage 3 chronic kidney disease GFR 30-59 (HCC)   . Major depression, recurrent ([name])   . CKD (chronic kidney disease), stage III     Past Surgical History  Procedure Laterality Date  . Cardiac catheterization  10/19/2013    [facility]  . Uvulectomy  1995  . Atrial fibrillation ablation  1995  . Elbow surgery Left   . Rotator cuff repair    . Carpal tunnel release      Family History  Problem Relation Age of Onset  . Stroke Mother   . COPD Mother   . Heart failure Mother   . Heart attack Mother   . Colon cancer Father   . Diabetes Father   . Diabetes Brother   . Cancer Brother     Social History   Social History  . Marital Status: Married    Spouse Name: Angel Cruz  . Number of Children: 2  . Years of Education: N/A   Occupational History  . Not on file.   Social History Main Topics  . Smoking status: Never Smoker   . Smokeless tobacco: Never Used  . Alcohol Use: No  .  Drug Use: No  . Sexual Activity: No   Other Topics Concern  . Not on file   Social History Narrative     Current outpatient prescriptions:  .  amLODipine (NORVASC) 5 MG tablet, Take 1 tablet (5 mg total) by mouth daily. (Patient taking differently: Take 5 mg by mouth 2 (two) times daily. ), Disp: 30 tablet, Rfl: 6 .  aspirin EC 81 MG tablet, Take 81 mg by mouth daily., Disp: , Rfl:  .  B-D UF III MINI PEN NEEDLES 31G X 5 MM MISC, USE AS DIRECTED ONCE DAILY, Disp: 100 each, Rfl: 0 .  CRESTOR 20 MG tablet, TAKE 1 TABLET(20 MG) BY MOUTH AT BEDTIME, Disp: 30 tablet, Rfl: 5 .  Cyanocobalamin (VITAMIN B-12 PO), Take 1 tablet by mouth daily. 515mq, Disp: , Rfl:  .  escitalopram (LEXAPRO) 10 MG tablet, Take 1  tablet (10 mg total) by mouth daily. (Patient taking differently: Take 10 mg by mouth at bedtime. ), Disp: 90 tablet, Rfl: 1 .  Insulin Degludec (TRESIBA FLEXTOUCH) 200 UNIT/ML SOPN, Inject 3 Units into the skin every evening., Disp: , Rfl:  .  nitroGLYCERIN (NITROSTAT) 0.4 MG SL tablet, Place 0.4 mg under the tongue every 5 (five) minutes as needed for chest pain., Disp: , Rfl:  .  rOPINIRole (REQUIP) 0.5 MG tablet, TAKE 1 TABLET(0.5 MG) BY MOUTH AT BEDTIME, Disp: 30 tablet, Rfl: 5 .  sodium bicarbonate 650 MG tablet, Take 1 tablet by mouth 2 (two) times daily., Disp: , Rfl: 3 .  traZODone (DESYREL) 50 MG tablet, TAKE 1/2 TO 1 AND 1/2 TABLETS(25 TO 75 MG) BY MOUTH AT BEDTIME AS NEEDED FOR SLEEP, Disp: 135 tablet, Rfl: 0 .  Vitamin D, Ergocalciferol, (DRISDOL) 50000 units CAPS capsule, TK ONE C PO  Q WEEK, Disp: , Rfl: 1  Allergies  Allergen Reactions  . Ace Inhibitors     Kidney failure  . Valsartan Other (See Comments)    Kidney failure     ROS  Constitutional: Negative for fever , positive for  weight change.  Respiratory: Negative for cough and shortness of breath.   Cardiovascular: Negative for chest pain or palpitations.  Gastrointestinal: Negative for abdominal pain, no bowel changes.  Musculoskeletal: Negative for gait problem or joint swelling.  Skin: Negative for rash.  Neurological: Negative for dizziness or headache.  No other specific complaints in a complete review of systems (except as listed in HPI above).  Objective  Filed Vitals:   04/16/15 1555  BP: 152/80  Pulse: 84  Temp: 97.8 F (36.6 C)  TempSrc: Oral  Resp: 16  Height: _0  (1.702 m)  Weight: 248 lb 9.6 oz (112.764 kg)  SpO2: 90%    Body mass index is 38.93 kg/(m^2).  Physical Exam  Constitutional: Patient appears well-developed and well-nourished. Obese  No distress.  HEENT: head atraumatic, normocephalic, pupils equal and reactive to light,  neck supple, throat within normal limits, no teeth  or dentures today Cardiovascular: Normal rate, regular rhythm and normal heart sounds.  No murmur heard. BLE 1 plus pitting - wife states Dr. AFletcher Anonaware - he can't tolerate diuretics Pulmonary/Chest: Effort normal and breath sounds normal. No respiratory distress. Abdominal: Soft.  There is no tenderness. Psychiatric: Patient has a normal mood and affect. behavior is normal. Judgment and thought content normal. Neurological: atrophy left hand, paresthesia on 4th and 5th left fingers.   Recent Results (from the past 2160 hour(s))  Glucose, capillary     Status:  Abnormal   Collection Time: 01/16/15 11:13 PM  Result Value Ref Range   Glucose-Capillary 111 (H) 65 - 99 mg/dL   Comment 1 Notify RN   Basic metabolic panel     Status: Abnormal   Collection Time: 01/17/15  6:02 AM  Result Value Ref Range   Sodium 139 135 - 145 mmol/L   Potassium 4.2 3.5 - 5.1 mmol/L   Chloride 110 101 - 111 mmol/L   CO2 26 22 - 32 mmol/L   Glucose, Bld 105 (H) 65 - 99 mg/dL   BUN 30 (H) 6 - 20 mg/dL   Creatinine, Ser 1.78 (H) 0.61 - 1.24 mg/dL   Calcium 8.4 (L) 8.9 - 10.3 mg/dL   GFR calc non Af Amer 41 (L) >60 mL/min   GFR calc Af Amer 47 (L) >60 mL/min    Comment: (NOTE) The eGFR has been calculated using the CKD EPI equation. This calculation has not been validated in all clinical situations. eGFR's persistently <60 mL/min signify possible Chronic Kidney Disease.    Anion gap 3 (L) 5 - 15  CBC     Status: Abnormal   Collection Time: 01/17/15  6:02 AM  Result Value Ref Range   WBC 7.4 3.8 - 10.6 K/uL   RBC 3.31 (L) 4.40 - 5.90 MIL/uL   Hemoglobin 9.9 (L) 13.0 - 18.0 g/dL   HCT 29.3 (L) 40.0 - 52.0 %   MCV 88.6 80.0 - 100.0 fL   MCH 30.1 26.0 - 34.0 pg   MCHC 33.9 32.0 - 36.0 g/dL   RDW 16.6 (H) 11.5 - 14.5 %   Platelets 154 150 - 440 K/uL  Glucose, capillary     Status: None   Collection Time: 01/17/15  7:34 AM  Result Value Ref Range   Glucose-Capillary 94 65 - 99 mg/dL   Comment 1 Notify  RN   Hemoglobin A1c     Status: None   Collection Time: 02/05/15  3:13 PM  Result Value Ref Range   Hgb A1c MFr Bld 6.0 4.0 - 6.0 %  Comprehensive metabolic panel     Status: Abnormal   Collection Time: 02/05/15  3:13 PM  Result Value Ref Range   Sodium 140 135 - 145 mmol/L   Potassium 4.8 3.5 - 5.1 mmol/L   Chloride 110 101 - 111 mmol/L   CO2 26 22 - 32 mmol/L   Glucose, Bld 168 (H) 65 - 99 mg/dL   BUN 41 (H) 6 - 20 mg/dL   Creatinine, Ser 1.73 (H) 0.61 - 1.24 mg/dL   Calcium 8.9 8.9 - 10.3 mg/dL   Total Protein 6.5 6.5 - 8.1 g/dL   Albumin 3.3 (L) 3.5 - 5.0 g/dL   AST 20 15 - 41 U/L   ALT 16 (L) 17 - 63 U/L   Alkaline Phosphatase 59 38 - 126 U/L   Total Bilirubin 0.2 (L) 0.3 - 1.2 mg/dL   GFR calc non Af Amer 42 (L) >60 mL/min   GFR calc Af Amer 49 (L) >60 mL/min    Comment: (NOTE) The eGFR has been calculated using the CKD EPI equation. This calculation has not been validated in all clinical situations. eGFR's persistently <60 mL/min signify possible Chronic Kidney Disease.    Anion gap 4 (L) 5 - 15  CBC with Differential/Platelet     Status: Abnormal   Collection Time: 02/05/15  3:13 PM  Result Value Ref Range   WBC 7.8 3.8 - 10.6 K/uL   RBC  3.92 (L) 4.40 - 5.90 MIL/uL   Hemoglobin 11.1 (L) 13.0 - 18.0 g/dL   HCT 33.6 (L) 40.0 - 52.0 %   MCV 85.8 80.0 - 100.0 fL   MCH 28.3 26.0 - 34.0 pg   MCHC 32.9 32.0 - 36.0 g/dL   RDW 15.6 (H) 11.5 - 14.5 %   Platelets 193 150 - 440 K/uL   Neutrophils Relative % 73 %   Neutro Abs 5.7 1.4 - 6.5 K/uL   Lymphocytes Relative 17 %   Lymphs Abs 1.4 1.0 - 3.6 K/uL   Monocytes Relative 4 %   Monocytes Absolute 0.3 0.2 - 1.0 K/uL   Eosinophils Relative 5 %   Eosinophils Absolute 0.4 0 - 0.7 K/uL   Basophils Relative 1 %   Basophils Absolute 0.1 0 - 0.1 K/uL  Ferritin     Status: None   Collection Time: 02/05/15  3:13 PM  Result Value Ref Range   Ferritin 79 24 - 336 ng/mL  Vitamin B12     Status: Abnormal   Collection Time:  02/06/15  2:07 PM  Result Value Ref Range   Vitamin B-12 1032 (H) 180 - 914 pg/mL    Comment: (NOTE) This assay is not validated for testing neonatal or myeloproliferative syndrome specimens for Vitamin B12 levels. Performed at [facility] D 25 Hydroxy (Vit-D Deficiency, Fractures)     Status: Abnormal   Collection Time: 02/06/15  2:07 PM  Result Value Ref Range   Vit D, 25-Hydroxy 12.1 (L) 30.0 - 100.0 ng/mL    Comment: (NOTE) Vitamin D deficiency has been defined by the [facility] practice guideline as a level of serum 25-OH vitamin D less than 20 ng/mL (1,2). The Endocrine Society went on to further define vitamin D insufficiency as a level between 21 and 29 ng/mL (2). 1. IOM (Institute of Medicine). 2010. Dietary reference   intakes for calcium and D. [address]: The   [employer]. 2. Holick MF, [place], Bischoff-Ferrari HA, et al.   Evaluation, treatment, and prevention of vitamin D   deficiency: an Endocrine Society clinical practice   guideline. JCEM. 2011 Jul; 96(7):1911-30. Performed At: [facility] [place], [place] [id number] Lindon Romp MD [id number]   Parathyroid hormone, intact (no Ca)     Status: Abnormal   Collection Time: 02/06/15  2:07 PM  Result Value Ref Range   PTH 74 (H) 15 - 65 pg/mL    Comment: (NOTE) Performed At: [facility] [place], [place] [id number] Lindon Romp MD [id number]   Basic metabolic panel     Status: Abnormal   Collection Time: 02/10/15  9:18 PM  Result Value Ref Range   Sodium 141 135 - 145 mmol/L   Potassium 4.8 3.5 - 5.1 mmol/L   Chloride 112 (H) 101 - 111 mmol/L   CO2 24 22 - 32 mmol/L   Glucose, Bld 156 (H) 65 - 99 mg/dL   BUN 45 (H) 6 - 20 mg/dL   Creatinine, Ser 2.02 (H) 0.61 - 1.24 mg/dL   Calcium 9.2 8.9 - 10.3 mg/dL   GFR calc non Af Amer 35 (L) >60 mL/min   GFR calc Af Amer 40 (L) >60  mL/min    Comment: (NOTE) The eGFR has been calculated using the CKD EPI equation. This calculation has not been validated in all clinical situations. eGFR's persistently <60 mL/min signify possible Chronic Kidney  Disease.    Anion gap 5 5 - 15  CBC     Status: Abnormal   Collection Time: 02/10/15  9:18 PM  Result Value Ref Range   WBC 7.6 3.8 - 10.6 K/uL   RBC 3.65 (L) 4.40 - 5.90 MIL/uL   Hemoglobin 10.8 (L) 13.0 - 18.0 g/dL   HCT 31.7 (L) 40.0 - 52.0 %   MCV 87.0 80.0 - 100.0 fL   MCH 29.7 26.0 - 34.0 pg   MCHC 34.1 32.0 - 36.0 g/dL   RDW 16.2 (H) 11.5 - 14.5 %   Platelets 196 150 - 440 K/uL  Troponin I     Status: None   Collection Time: 02/10/15  9:18 PM  Result Value Ref Range   Troponin I <0.03 <0.031 ng/mL    Comment:        NO INDICATION OF MYOCARDIAL INJURY.   Brain natriuretic peptide     Status: Abnormal   Collection Time: 02/11/15 12:06 AM  Result Value Ref Range   B Natriuretic Peptide 266.0 (H) 0.0 - 100.0 pg/mL  Troponin I     Status: None   Collection Time: 02/11/15  2:26 AM  Result Value Ref Range   Troponin I <0.03 <0.031 ng/mL    Comment:        NO INDICATION OF MYOCARDIAL INJURY.   CBC     Status: Abnormal   Collection Time: 04/10/15  9:47 AM  Result Value Ref Range   WBC 5.2 3.8 - 10.6 K/uL   RBC 3.74 (L) 4.40 - 5.90 MIL/uL   Hemoglobin 10.5 (L) 13.0 - 18.0 g/dL   HCT 31.1 (L) 40.0 - 52.0 %   MCV 83.1 80.0 - 100.0 fL   MCH 28.0 26.0 - 34.0 pg   MCHC 33.7 32.0 - 36.0 g/dL   RDW 14.7 (H) 11.5 - 14.5 %   Platelets 154 150 - 440 K/uL  Basic metabolic panel     Status: Abnormal   Collection Time: 04/10/15  9:47 AM  Result Value Ref Range   Sodium 140 135 - 145 mmol/L   Potassium 4.1 3.5 - 5.1 mmol/L   Chloride 109 101 - 111 mmol/L   CO2 26 22 - 32 mmol/L   Glucose, Bld 167 (H) 65 - 99 mg/dL   BUN 36 (H) 6 - 20 mg/dL   Creatinine, Ser 2.04 (H) 0.61 - 1.24 mg/dL   Calcium 8.9 8.9 - 10.3 mg/dL   GFR calc non Af Amer 34 (L) >60 mL/min   GFR  calc Af Amer 40 (L) >60 mL/min    Comment: (NOTE) The eGFR has been calculated using the CKD EPI equation. This calculation has not been validated in all clinical situations. eGFR's persistently <60 mL/min signify possible Chronic Kidney Disease.    Anion gap 5 5 - 15     PHQ2/9: Depression screen [facility] 2/9 03/01/2015 02/05/2015 12/06/2014 10/10/2014 07/30/2014  Decreased Interest 0 0 1 0 1  Down, Depressed, Hopeless 0 0 1 0 1  PHQ - 2 Score 0 0 2 0 2  Altered sleeping - - 0 - 3  Tired, decreased energy - - 1 - 2  Change in appetite - - 0 - 2  Feeling bad or failure about yourself  - - 0 - 1  Trouble concentrating - - 0 - 1  Moving slowly or fidgety/restless - - 0 - 2  Suicidal thoughts - - 0 - 0  PHQ-9 Score - - 3 - 13  Difficult doing work/chores - - Somewhat difficult - Somewhat difficult     Fall Risk: Fall Risk  03/01/2015 02/05/2015 12/06/2014 10/10/2014 07/30/2014  Falls in the past year? Yes Yes No No No  Number falls in past yr: 2 or more 2 or more - - -  Injury with Fall? No No - - -     Assessment & Plan  1. Left carpal tunnel syndrome  Cardiac clearance already given by Dr. Abigail Butts and Dr. Fletcher Anon, may proceed to surgery   2. Ulnar nerve entrapment at elbow, right  Proceed with surgery   3. Hypertension, benign  His bp today was elevated, but has been well controlled at home , he is nervous about surgery tomorrow. Continue bp  medications tonight and  am as discussed by anesthesiologist.   4. Type 2 diabetes mellitus with stage 3 chronic kidney disease, with long-term current use of insulin (HCC)  Last hgbA1C was at goal, taking medications as prescribed, may proceed with surgery

## 2015-04-17 ENCOUNTER — Encounter: Payer: Self-pay | Admitting: *Deleted

## 2015-04-17 ENCOUNTER — Ambulatory Visit
Admission: RE | Admit: 2015-04-17 | Discharge: 2015-04-17 | Disposition: A | Discharge status: Home / Self Care | Payer: Self-pay | Source: Ambulatory Visit | Attending: Specialist | Admitting: Specialist

## 2015-04-17 ENCOUNTER — Ambulatory Visit: Payer: Self-pay | Admitting: Anesthesiology

## 2015-04-17 ENCOUNTER — Encounter
Admission: EM | Disposition: A | Discharge status: Home / Self Care | Payer: Self-pay | Source: Ambulatory Visit | Attending: Specialist

## 2015-04-17 DIAGNOSIS — G5602 Carpal tunnel syndrome, left upper limb: Secondary | ICD-10-CM | POA: Insufficient documentation

## 2015-04-17 DIAGNOSIS — F329 Major depressive disorder, single episode, unspecified: Secondary | ICD-10-CM | POA: Insufficient documentation

## 2015-04-17 DIAGNOSIS — G5622 Lesion of ulnar nerve, left upper limb: Secondary | ICD-10-CM | POA: Insufficient documentation

## 2015-04-17 DIAGNOSIS — I255 Ischemic cardiomyopathy: Secondary | ICD-10-CM | POA: Insufficient documentation

## 2015-04-17 DIAGNOSIS — M1990 Unspecified osteoarthritis, unspecified site: Secondary | ICD-10-CM | POA: Insufficient documentation

## 2015-04-17 DIAGNOSIS — I130 Hypertensive heart and chronic kidney disease with heart failure and stage 1 through stage 4 chronic kidney disease, or unspecified chronic kidney disease: Secondary | ICD-10-CM | POA: Insufficient documentation

## 2015-04-17 DIAGNOSIS — I509 Heart failure, unspecified: Secondary | ICD-10-CM | POA: Insufficient documentation

## 2015-04-17 DIAGNOSIS — Z8249 Family history of ischemic heart disease and other diseases of the circulatory system: Secondary | ICD-10-CM | POA: Insufficient documentation

## 2015-04-17 DIAGNOSIS — G4733 Obstructive sleep apnea (adult) (pediatric): Secondary | ICD-10-CM | POA: Insufficient documentation

## 2015-04-17 DIAGNOSIS — Z833 Family history of diabetes mellitus: Secondary | ICD-10-CM | POA: Insufficient documentation

## 2015-04-17 DIAGNOSIS — N183 Chronic kidney disease, stage 3 (moderate): Secondary | ICD-10-CM | POA: Insufficient documentation

## 2015-04-17 DIAGNOSIS — Z6838 Body mass index (BMI) 38.0-38.9, adult: Secondary | ICD-10-CM | POA: Insufficient documentation

## 2015-04-17 DIAGNOSIS — Z419 Encounter for procedure for purposes other than remedying health state, unspecified: Secondary | ICD-10-CM

## 2015-04-17 DIAGNOSIS — Z825 Family history of asthma and other chronic lower respiratory diseases: Secondary | ICD-10-CM | POA: Insufficient documentation

## 2015-04-17 DIAGNOSIS — Z809 Family history of malignant neoplasm, unspecified: Secondary | ICD-10-CM | POA: Insufficient documentation

## 2015-04-17 DIAGNOSIS — Z823 Family history of stroke: Secondary | ICD-10-CM | POA: Insufficient documentation

## 2015-04-17 DIAGNOSIS — E1122 Type 2 diabetes mellitus with diabetic chronic kidney disease: Secondary | ICD-10-CM | POA: Insufficient documentation

## 2015-04-17 DIAGNOSIS — E785 Hyperlipidemia, unspecified: Secondary | ICD-10-CM | POA: Insufficient documentation

## 2015-04-17 DIAGNOSIS — Z800 Family history of malignant neoplasm of digestive organs: Secondary | ICD-10-CM | POA: Insufficient documentation

## 2015-04-17 DIAGNOSIS — I2510 Atherosclerotic heart disease of native coronary artery without angina pectoris: Secondary | ICD-10-CM | POA: Insufficient documentation

## 2015-04-17 DIAGNOSIS — I252 Old myocardial infarction: Secondary | ICD-10-CM | POA: Insufficient documentation

## 2015-04-17 DIAGNOSIS — R0902 Hypoxemia: Secondary | ICD-10-CM | POA: Insufficient documentation

## 2015-04-17 HISTORY — PX: TENNIS ELBOW RELEASE/NIRSCHEL PROCEDURE: SHX6651

## 2015-04-17 HISTORY — PX: CARPAL TUNNEL RELEASE: SHX101

## 2015-04-17 LAB — GLUCOSE, CAPILLARY
GLUCOSE-CAPILLARY: 81 mg/dL (ref 65–99)
Glucose-Capillary: 94 mg/dL (ref 65–99)

## 2015-04-17 SURGERY — CANCELLED PROCEDURE
Anesthesia: General | Laterality: Left

## 2015-04-17 SURGERY — CARPAL TUNNEL RELEASE
Anesthesia: General | Laterality: Left

## 2015-04-17 MED ORDER — PROPOFOL 500 MG/50ML IV EMUL
INTRAVENOUS | Status: DC | PRN
  Administered 2015-04-17: 35 ug/kg/min via INTRAVENOUS
  Administered 2015-04-17: 15 ug/kg/min via INTRAVENOUS

## 2015-04-17 MED ORDER — CEFAZOLIN SODIUM-DEXTROSE 2-3 GM-% IV SOLR
2.0000 g | INTRAVENOUS | Status: AC
  Administered 2015-04-17: 2 g via INTRAVENOUS

## 2015-04-17 MED ORDER — IPRATROPIUM-ALBUTEROL 0.5-2.5 (3) MG/3ML IN SOLN
RESPIRATORY_TRACT | Status: AC
Start: 2015-04-17 — End: 2015-04-17
  Administered 2015-04-17: 3 mL via RESPIRATORY_TRACT
  Filled 2015-04-17: qty 3

## 2015-04-17 MED ORDER — MELOXICAM 7.5 MG PO TABS
15.0000 mg | ORAL_TABLET | Freq: Every day | ORAL | Status: DC
  Administered 2015-04-17: 15 mg via ORAL

## 2015-04-17 MED ORDER — NEOMYCIN-POLYMYXIN B GU 40-200000 IR SOLN
Status: AC
  Filled 2015-04-17: qty 4

## 2015-04-17 MED ORDER — BUPIVACAINE HCL (PF) 0.5 % IJ SOLN
INTRAMUSCULAR | Status: AC
  Filled 2015-04-17: qty 30

## 2015-04-17 MED ORDER — FUROSEMIDE 10 MG/ML IJ SOLN
INTRAMUSCULAR | Status: AC
  Filled 2015-04-17: qty 2

## 2015-04-17 MED ORDER — IPRATROPIUM-ALBUTEROL 0.5-2.5 (3) MG/3ML IN SOLN
3.0000 mL | RESPIRATORY_TRACT | Status: DC
  Administered 2015-04-17: 3 mL via RESPIRATORY_TRACT

## 2015-04-17 MED ORDER — LACTATED RINGERS IV SOLN
INTRAVENOUS | Status: DC | PRN
  Administered 2015-04-17 (×6): via INTRAVENOUS

## 2015-04-17 MED ORDER — GABAPENTIN 400 MG PO CAPS
ORAL_CAPSULE | ORAL | Status: AC
  Administered 2015-04-17: 400 mg via ORAL
  Filled 2015-04-17: qty 1

## 2015-04-17 MED ORDER — BUPIVACAINE HCL (PF) 0.5 % IJ SOLN
INTRAMUSCULAR | Status: DC | PRN
  Administered 2015-04-17 (×2): 20 mL via INTRA_ARTICULAR

## 2015-04-17 MED ORDER — MIDAZOLAM HCL 2 MG/2ML IJ SOLN
INTRAMUSCULAR | Status: DC | PRN
  Administered 2015-04-17 (×2): 1 mg via INTRAVENOUS

## 2015-04-17 MED ORDER — HYDROCODONE-ACETAMINOPHEN 5-325 MG PO TABS: 1.0000 | ORAL_TABLET | Freq: Four times a day (QID) | ORAL | Status: AC | PRN

## 2015-04-17 MED ORDER — FUROSEMIDE 10 MG/ML IJ SOLN
10.0000 mg | Freq: Once | INTRAMUSCULAR | Status: AC
  Administered 2015-04-17: 10 mg via INTRAVENOUS

## 2015-04-17 MED ORDER — LIDOCAINE HCL (PF) 0.5 % IJ SOLN
INTRAMUSCULAR | Status: AC
  Filled 2015-04-17: qty 50

## 2015-04-17 MED ORDER — GABAPENTIN 400 MG PO CAPS: 400.0000 mg | ORAL_CAPSULE | Freq: Three times a day (TID) | ORAL | Status: AC

## 2015-04-17 MED ORDER — PROPOFOL 500 MG/50ML IV EMUL: INTRAVENOUS | Status: DC | PRN

## 2015-04-17 MED ORDER — CLINDAMYCIN PHOSPHATE 600 MG/50ML IV SOLN
INTRAVENOUS | Status: AC
  Administered 2015-04-17: 600 mg via INTRAVENOUS
  Filled 2015-04-17: qty 50

## 2015-04-17 MED ORDER — FAMOTIDINE 20 MG PO TABS
ORAL_TABLET | ORAL | Status: AC
  Administered 2015-04-17: 20 mg via ORAL
  Filled 2015-04-17: qty 1

## 2015-04-17 MED ORDER — GABAPENTIN 400 MG PO CAPS
400.0000 mg | ORAL_CAPSULE | Freq: Once | ORAL | Status: AC
  Administered 2015-04-17: 400 mg via ORAL

## 2015-04-17 MED ORDER — FENTANYL CITRATE (PF) 100 MCG/2ML IJ SOLN: 25.0000 ug | INTRAMUSCULAR | Status: DC | PRN

## 2015-04-17 MED ORDER — DEXMEDETOMIDINE HCL IN NACL 200 MCG/50ML IV SOLN
INTRAVENOUS | Status: DC | PRN
  Administered 2015-04-17: 8 ug via INTRAVENOUS

## 2015-04-17 MED ORDER — CLINDAMYCIN PHOSPHATE 600 MG/50ML IV SOLN: 600.0000 mg | Freq: Three times a day (TID) | INTRAVENOUS | Status: DC

## 2015-04-17 MED ORDER — GLYCOPYRROLATE 0.2 MG/ML IJ SOLN
INTRAMUSCULAR | Status: DC | PRN
  Administered 2015-04-17: 0.2 mg via INTRAVENOUS

## 2015-04-17 MED ORDER — SODIUM CHLORIDE 0.9 % IV SOLN
INTRAVENOUS | Status: DC
  Administered 2015-04-17: 07:00:00 via INTRAVENOUS

## 2015-04-17 MED ORDER — MELOXICAM 7.5 MG PO TABS
ORAL_TABLET | ORAL | Status: AC
  Administered 2015-04-17: 15 mg via ORAL
  Filled 2015-04-17: qty 2

## 2015-04-17 MED ORDER — KETAMINE HCL 10 MG/ML IJ SOLN
INTRAMUSCULAR | Status: DC | PRN
  Administered 2015-04-17: 10 mg via INTRAVENOUS
  Administered 2015-04-17: 5 mg via INTRAVENOUS

## 2015-04-17 MED ORDER — NEOMYCIN-POLYMYXIN B GU 40-200000 IR SOLN
Status: DC | PRN
  Administered 2015-04-17: 2 mL

## 2015-04-17 MED ORDER — CEFAZOLIN SODIUM-DEXTROSE 2-3 GM-% IV SOLR
INTRAVENOUS | Status: AC
  Filled 2015-04-17: qty 50

## 2015-04-17 MED ORDER — FAMOTIDINE 20 MG PO TABS
20.0000 mg | ORAL_TABLET | Freq: Once | ORAL | Status: AC
  Administered 2015-04-17: 20 mg via ORAL

## 2015-04-17 MED ORDER — MELOXICAM 15 MG PO TABS: 15.0000 mg | ORAL_TABLET | Freq: Every day | ORAL | Status: AC

## 2015-04-17 MED ORDER — FENTANYL CITRATE (PF) 100 MCG/2ML IJ SOLN
INTRAMUSCULAR | Status: DC | PRN
  Administered 2015-04-17 (×2): 50 ug via INTRAVENOUS

## 2015-04-17 MED ORDER — ONDANSETRON HCL 4 MG/2ML IJ SOLN: 4.0000 mg | Freq: Once | INTRAMUSCULAR | Status: DC | PRN

## 2015-04-17 MED ORDER — CEFAZOLIN SODIUM 1-5 GM-% IV SOLN: INTRAVENOUS | Status: DC | PRN

## 2015-04-17 SURGICAL SUPPLY — 43 items
BLADE SURG MINI STRL (BLADE) ×3 IMPLANT
BNDG COHESIVE 4X5 TAN STRL (GAUZE/BANDAGES/DRESSINGS) ×3 IMPLANT
BNDG ESMARK 4X12 TAN STRL LF (GAUZE/BANDAGES/DRESSINGS) ×3 IMPLANT
CANISTER SUCT 1200ML W/VALVE (MISCELLANEOUS) ×3 IMPLANT
CHLORAPREP W/TINT 26ML (MISCELLANEOUS) ×3 IMPLANT
DRAPE C-ARM XRAY 36X54 (DRAPES) ×3 IMPLANT
ELECT REM PT RETURN 9FT ADLT (ELECTROSURGICAL) ×3
ELECTRODE REM PT RTRN 9FT ADLT (ELECTROSURGICAL) ×1 IMPLANT
GAUZE FLUFF 18X24 1PLY STRL (GAUZE/BANDAGES/DRESSINGS) ×3 IMPLANT
GAUZE PETRO XEROFOAM 1X8 (MISCELLANEOUS) ×3 IMPLANT
GAUZE SPONGE 4X4 12PLY STRL (GAUZE/BANDAGES/DRESSINGS) ×3 IMPLANT
GLOVE BIO SURGEON STRL SZ8 (GLOVE) ×15 IMPLANT
GLOVE INDICATOR 8.0 STRL GRN (GLOVE) ×3 IMPLANT
GLOVE SURG ORTHO 8.5 STRL (GLOVE) ×3 IMPLANT
GOWN STRL REUS W/ TWL LRG LVL3 (GOWN DISPOSABLE) ×2 IMPLANT
GOWN STRL REUS W/ TWL XL LVL3 (GOWN DISPOSABLE) ×1 IMPLANT
GOWN STRL REUS W/TWL LRG LVL3 (GOWN DISPOSABLE) ×4
GOWN STRL REUS W/TWL XL LVL3 (GOWN DISPOSABLE) ×2
HANDLE YANKAUER SUCT BULB TIP (MISCELLANEOUS) ×3 IMPLANT
KIT RM TURNOVER STRD PROC AR (KITS) ×3 IMPLANT
LOOP VESSEL SUPERMAXI WHITE (MISCELLANEOUS) ×3 IMPLANT
NS IRRIG 1000ML POUR BTL (IV SOLUTION) ×3 IMPLANT
NS IRRIG 500ML POUR BTL (IV SOLUTION) ×3 IMPLANT
PACK EXTREMITY ARMC (MISCELLANEOUS) ×3 IMPLANT
PAD PREP 24X41 OB/GYN DISP (PERSONAL CARE ITEMS) ×3 IMPLANT
PADDING CAST 4IN STRL (MISCELLANEOUS) ×4
PADDING CAST BLEND 4X4 STRL (MISCELLANEOUS) ×2 IMPLANT
SLING ARM LRG DEEP (SOFTGOODS) ×3 IMPLANT
SLING ARM M TX990204 (SOFTGOODS) IMPLANT
SPLINT CAST 1 STEP 3X12 (MISCELLANEOUS) ×3 IMPLANT
SPONGE LAP 18X18 5 PK (GAUZE/BANDAGES/DRESSINGS) ×3 IMPLANT
STAPLER SKIN PROX 35W (STAPLE) ×3 IMPLANT
STOCKINETTE BIAS CUT 4 980044 (GAUZE/BANDAGES/DRESSINGS) ×3 IMPLANT
STOCKINETTE STRL 4IN 9604848 (GAUZE/BANDAGES/DRESSINGS) ×3 IMPLANT
STOCKINETTE STRL 6IN 960660 (GAUZE/BANDAGES/DRESSINGS) ×3 IMPLANT
SUT ETHIBOND 0 (SUTURE) ×3 IMPLANT
SUT ETHILON 4-0 (SUTURE) ×2
SUT ETHILON 4-0 FS2 18XMFL BLK (SUTURE) ×1
SUT ETHILON 5-0 FS-2 18 BLK (SUTURE) ×3 IMPLANT
SUT VIC AB 2-0 CT1 36 (SUTURE) ×3 IMPLANT
SUT VICRYL 3-0 27IN (SUTURE) ×6 IMPLANT
SUTURE ETHLN 4-0 FS2 18XMF BLK (SUTURE) ×1 IMPLANT
TAPE TRANSPORE STRL 2 31045 (GAUZE/BANDAGES/DRESSINGS) ×3 IMPLANT

## 2015-04-17 SURGICAL SUPPLY — 41 items
BLADE SURG MINI STRL (BLADE) IMPLANT
BNDG COHESIVE 4X5 TAN STRL (GAUZE/BANDAGES/DRESSINGS) IMPLANT
BNDG ESMARK 4X12 TAN STRL LF (GAUZE/BANDAGES/DRESSINGS) IMPLANT
CANISTER SUCT 1200ML W/VALVE (MISCELLANEOUS) IMPLANT
CHLORAPREP W/TINT 26ML (MISCELLANEOUS) IMPLANT
DRAPE C-ARM XRAY 36X54 (DRAPES) IMPLANT
ELECT REM PT RETURN 9FT ADLT (ELECTROSURGICAL)
ELECTRODE REM PT RTRN 9FT ADLT (ELECTROSURGICAL) IMPLANT
GAUZE FLUFF 18X24 1PLY STRL (GAUZE/BANDAGES/DRESSINGS) IMPLANT
GAUZE PETRO XEROFOAM 1X8 (MISCELLANEOUS) IMPLANT
GAUZE SPONGE 4X4 12PLY STRL (GAUZE/BANDAGES/DRESSINGS) IMPLANT
GLOVE BIO SURGEON STRL SZ8 (GLOVE) IMPLANT
GLOVE INDICATOR 8.0 STRL GRN (GLOVE) IMPLANT
GLOVE SURG ORTHO 8.5 STRL (GLOVE) IMPLANT
GOWN STRL REUS W/ TWL LRG LVL3 (GOWN DISPOSABLE) IMPLANT
GOWN STRL REUS W/ TWL XL LVL3 (GOWN DISPOSABLE) IMPLANT
GOWN STRL REUS W/TWL LRG LVL3 (GOWN DISPOSABLE)
GOWN STRL REUS W/TWL XL LVL3 (GOWN DISPOSABLE)
HANDLE YANKAUER SUCT BULB TIP (MISCELLANEOUS) IMPLANT
KIT RM TURNOVER STRD PROC AR (KITS) IMPLANT
LOOP VESSEL SUPERMAXI WHITE (MISCELLANEOUS) IMPLANT
NS IRRIG 1000ML POUR BTL (IV SOLUTION) IMPLANT
NS IRRIG 500ML POUR BTL (IV SOLUTION) IMPLANT
PACK EXTREMITY ARMC (MISCELLANEOUS) IMPLANT
PAD PREP 24X41 OB/GYN DISP (PERSONAL CARE ITEMS) IMPLANT
PADDING CAST 4IN STRL (MISCELLANEOUS)
PADDING CAST BLEND 4X4 STRL (MISCELLANEOUS) IMPLANT
SLING ARM LRG DEEP (SOFTGOODS) IMPLANT
SLING ARM M TX990204 (SOFTGOODS) IMPLANT
SPLINT CAST 1 STEP 3X12 (MISCELLANEOUS) IMPLANT
SPONGE LAP 18X18 5 PK (GAUZE/BANDAGES/DRESSINGS) IMPLANT
STAPLER SKIN PROX 35W (STAPLE) IMPLANT
STOCKINETTE BIAS CUT 4 980044 (GAUZE/BANDAGES/DRESSINGS) IMPLANT
STOCKINETTE STRL 4IN 9604848 (GAUZE/BANDAGES/DRESSINGS) IMPLANT
STOCKINETTE STRL 6IN 960660 (GAUZE/BANDAGES/DRESSINGS) IMPLANT
SUT ETHILON 4-0 (SUTURE)
SUT ETHILON 4-0 FS2 18XMFL BLK (SUTURE)
SUT ETHILON 5-0 FS-2 18 BLK (SUTURE) IMPLANT
SUT VIC AB 2-0 CT1 36 (SUTURE) IMPLANT
SUT VICRYL 3-0 27IN (SUTURE) IMPLANT
SUTURE ETHLN 4-0 FS2 18XMF BLK (SUTURE) IMPLANT

## 2015-04-17 NOTE — OR Nursing (Signed)
Dr Fonnie Birkenhead called in for consult, Dr Noralyn Pick spoke with Dr Fonnie Birkenhead.  Dr Fonnie Birkenhead at bedside.

## 2015-04-17 NOTE — Anesthesia Postprocedure Evaluation (Signed)
Anesthesia Post Note  Patient: Vernon Zettler  Procedure(s) Performed: Procedure(s) (LRB): CARPAL TUNNEL RELEASE (Left) TENNIS ELBOW RELEASE/NIRSCHEL PROCEDURE (Left)  Patient location during evaluation: PACU Anesthesia Type: General and Bier Block Level of consciousness: awake and alert and oriented Pain management: pain level controlled Vital Signs Assessment: post-procedure vital signs reviewed and stable Respiratory status: spontaneous breathing Cardiovascular status: blood pressure returned to baseline Anesthetic complications: no    Last Vitals:  Filed Vitals:   04/17/15 1617 04/17/15 1627  BP: 133/81 132/74  Pulse: 56 57  Temp:    Resp: 18 18    Last Pain:  Filed Vitals:   04/17/15 1642  PainSc: 0-No pain                 Pinchas Reither

## 2015-04-17 NOTE — OR Nursing (Signed)
OR team at bedside.  Wife notified that patient to return to OR soon.

## 2015-04-17 NOTE — H&P (Signed)
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 Symptoms are rated as moderate to severe, and have been worsening.  This is significantly impairing activities of daily living.  He has elected for surgical management.   Past Medical History  Diagnosis Date  . Diabetes mellitus without complication (HCC)   . Hypertension   . Hyperlipidemia   . Depression   . Rotator cuff tear   . CKD (chronic kidney disease), stage III   . Ischemic cardiomyopathy     a. 10/2013 Echo: EF 35-40%, severe distal anteroseptal, anterior, and apical HK. Diast dysfxn, mild conc LVH, mildly dil LA, mild Ao sclerosis w/o stenosis.  . CAD (coronary artery disease)     a. 10/2013 Lexi MV: EF 31%, apical, septal, ant-apical, inf-apical, lat-apical scar, septal and apical peri-infarct ischemia;  b. 10/2013 Cath: LM nl, LAD 20ost, 100m (faint R->L collats), D1 80p, LCX min irregs, OM1 min irregs, OM2 nl, OM3 30p, RCA 20p, PDA 60, RPL min irregs-->Med Rx.  . Morbid obesity (HCC)   . CHF (congestive heart failure) (HCC)   . OSA (obstructive sleep apnea)   . Sleep apnea     recent test confirmed diagnosis, was performed at the sleep center across the street  . Hypotension   . CHF (congestive heart failure) (HCC)   . Myocardial infarction (HCC)    Past Surgical History  Procedure Laterality Date  . Cardiac catheterization  10/19/2013    [facility]  . Uvulectomy  1995  . Atrial fibrillation ablation  1995  . Elbow surgery Left   . Rotator cuff repair    . Carpal tunnel release     Social History   Social History  . Marital Status: Married    Spouse Name: Denise  . Number of Children: 2  . Years of Education: N/A   Social History Main  Topics  . Smoking status: Never Smoker   . Smokeless tobacco: Never Used  . Alcohol Use: No  . Drug Use: No  . Sexual Activity: No   Other Topics Concern  . None   Social History Narrative   Family History  Problem Relation Age of Onset  . Stroke Mother   . COPD Mother   . Heart failure Mother   . Heart attack Mother   . Colon cancer Father   . Diabetes Father   . Diabetes Brother   . Cancer Brother    Allergies  Allergen Reactions  . Ace Inhibitors     Kidney failure  . Valsartan Other (See Comments)    Kidney failure   Prior to Admission medications   Medication Sig Start Date End Date Taking? Authorizing Provider  amLODipine (NORVASC) 5 MG tablet Take 1 tablet (5 mg total) by mouth daily. Patient taking differently: Take 5 mg by mouth 2 (two) times daily.  10/31/14  Yes Rima Vaickute, MD  carvedilol (COREG) 6.25 MG tablet Take 0.5 tablets (3.125 mg total) by mouth 2 (two) times daily with a meal. 04/16/15  Yes Krichna Sowles, MD  CRESTOR 20 MG tablet TAKE 1 TABLET(20 MG) BY MOUTH AT BEDTIME 10/22/14  Yes Krichna Sowles, MD  Cyanocobalamin (VITAMIN B-12 PO) Take 1 tablet by mouth daily. Marland Kitchen<[facility]<[id number] L23L<MMarland KitchenEVirginia Mason Medical CenterA<[id number]56e<MMarland KitchenEUpstate [facility])A<[id number]32a<MMarland KitchenEEmma [facility] Bradley HospitalA<[id number]51aMarland Kitchen<[facility]<MEA1610-[facility]61e<MMarland KitchenEArizona Digestive Institute LLCA<[id number] I74n<MMarland KitchenESt Christophers Hospital For ChildrenA<[id number]51e<MMarland KitchenECovenant Hospital LevellandA<[id number] L68LMarland Kitchen<[facility]<MEA161[facility]69e<MMarland KitchenECincinnati Children'S Hospital [facility] CenterA<[id number]4t<MMarland KitchenEEndoscopy Center Of The [facility] CoastA<[id number] Bran47c<MMarland KitchenETexas County Memorial HospitalA<[id number]63o<MMarland KitchenEFranciscan St Anthony Health - [facility] CityA<[id number]16e<MMarland KitchenESt Joseph Mercy OaklandA<[id number]51a<MMarland KitchenELakes Regional HealthcareA<[id number]r60i<MMarland KitchenEKindred Hospital LimaA<[id number]T<MEASUREMENT<MMarland KitchenE[facility]A<[id number]56eMarland Kitchen<[facility]<MEA161[facility] L26L<MMarland KitchenEOdessa Memorial Healthcare CenterA<[id number]21e<MMarland KitchenEMountains Community HospitalA<[id number] L23L<MMarland KitchenEFort Lauderdale Behavioral Health CenterA<[id number]11e<MMarland KitchenEE[facility]oscopy [facility] LLCA<[id number]55e<MMarland KitchenELake [facility] Medical CenterA<[id number]28e<MMarland KitchenEVa [facility]A<[id number]po25r<MMarland KitchenERegency Hospital [facility]A<[id number] L54L<MMarland KitchenEGila River Health Care CorporationA<[id number] Ar22eaRenee Harderschyer.it  Provider, MD  escitalopram (LEXAPRO) 10 MG tablet Take 1 tablet (10 mg total) by mouth daily. Patient taking differently: Take 10 mg by mouth at bedtime.  02/05/15  Yes Alba Cory, MD  Insulin Degludec (TRESIBA FLEXTOUCH) 200 UNIT/ML SOPN Inject 3 Units into the skin every evening.   Yes Historical Provider, MD  rOPINIRole (REQUIP) 0.5 MG tablet TAKE 1 TABLET(0.5 MG) BY MOUTH AT BEDTIME 01/30/15  Yes Alba Cory, MD  traZODone (DESYREL) 50 MG tablet TAKE 1/2 TO 1 AND 1/2 TABLETS(25 TO 75 MG) BY MOUTH AT BEDTIME AS NEEDED FOR SLEEP 02/05/15  Yes Alba Cory, MD  Vitamin D, Ergocalciferol, (DRISDOL) 50000 units CAPS capsule TK ONE C PO  Q WEEK 02/25/15  Yes Historical Provider, MD  aspirin EC 81 MG tablet Take 81 mg by mouth daily.    Historical Provider, MD   B-D UF III MINI PEN NEEDLES 31G X 5 MM MISC USE AS DIRECTED ONCE DAILY 01/25/15   Alba Cory, MD  nitroGLYCERIN (NITROSTAT) 0.4 MG SL tablet Place 0.4 mg under the tongue every 5 (five) minutes as needed for chest pain. Reported on 04/17/2015    Historical Provider, MD  sodium bicarbonate 650 MG tablet Take 1 tablet by mouth 2 (two) times daily. Reported on 04/17/2015 03/18/15   Lamont Dowdy, MD     Positive ROS: All other systems have been reviewed and were otherwise negative with the exception of those mentioned in the HPI and as above.  Physical Exam: General: Alert, no acute distress Cardiovascular: No pedal edema. Heart is regular and without murmur.  Respiratory: No cyanosis, no use of accessory musculature. Lungs are clear. GI: No organomegaly, abdomen is soft and non-tender Skin: No lesions in the area of chief complaint Neurologic: Sensation intact distally Psychiatric: Patient is competent for consent with normal mood and affect Lymphatic: No axillary or cervical lymphadenopathy  MUSCULOSKELETAL: Old incision left elbow.  rom 30-100*.  Numbnes and weakness in hand and with grip.  Ulnar nerve palpable in cubital tunnel.    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   Valinda Hoar, MD [phone]   04/17/2015 7:35 AM

## 2015-04-17 NOTE — Transfer of Care (Signed)
Immediate Anesthesia Transfer of Care Note  Patient: Angel Cruz  Procedure(s) Performed: Procedure(s): CANCELLED PROCEDURE  Patient Location: PACU  Anesthesia Type:case cancelled  Level of Consciousness: awake, alert  and oriented  Airway & Oxygen Therapy: Patient Spontanous Breathing  Post-op Assessment: Report given to RN and Post -op Vital signs reviewed and stable  Post vital signs: Reviewed and stable  Last Vitals:  Filed Vitals:   04/17/15 0615 04/17/15 0815  BP: 162/84 147/93  Pulse: 68 72  Temp: 36.4 C 36.8 C  Resp: 14 23    Complications: No apparent anesthesia complications

## 2015-04-17 NOTE — H&P (Signed)
THE PATIENT WAS SEEN PRIOR TO SURGERY TODAY.  HISTORY, ALLERGIES, HOME MEDICATIONS AND OPERATIVE PROCEDURE WERE REVIEWED. RISKS AND BENEFITS OF SURGERY DISCUSSED WITH PATIENT AGAIN.  NO CHANGES FROM INITIAL HISTORY AND PHYSICAL NOTED.    

## 2015-04-17 NOTE — Transfer of Care (Signed)
Immediate Anesthesia Transfer of Care Note  Patient: Angel Cruz  Procedure(s) Performed: Procedure(s): CANCELLED PROCEDURE  Patient Location: PACU  Anesthesia Type:MAC and Regional  Level of Consciousness: awake, alert  and oriented  Airway & Oxygen Therapy: Patient Spontanous Breathing and Patient connected to face mask oxygen  Post-op Assessment: Report given to RN and Post -op Vital signs reviewed and stable  Post vital signs: Reviewed and stable  Last Vitals:  Filed Vitals:   04/17/15 1100 04/17/15 1110  BP: 166/91 158/85  Pulse: 72 71  Temp:    Resp: 12 22    Complications: No apparent anesthesia complications

## 2015-04-17 NOTE — OR Nursing (Signed)
O2 sats 87-90 Dr Noralyn Pick notified Duoneb SVN given.

## 2015-04-17 NOTE — Anesthesia Preprocedure Evaluation (Addendum)
Anesthesia Evaluation  Patient identified by MRN, date of birth, ID band Patient awake    Reviewed: Allergy & Precautions, NPO status , Patient's Chart, lab work & pertinent test results, reviewed documented beta blocker date and time   Airway Mallampati: II  TM Distance: >3 FB     Dental  (+) Edentulous Lower, Edentulous Upper   Pulmonary    Pulmonary exam normal breath sounds clear to auscultation       Cardiovascular hypertension, + CAD, + Past MI and +CHF  Normal cardiovascular exam  EF 35-40%   Neuro/Psych    GI/Hepatic negative GI ROS, Neg liver ROS,   Endo/Other  diabetes  Renal/GU Renal Insufficiency  negative genitourinary   Musculoskeletal  (+) Arthritis , Osteoarthritis,    Abdominal Normal abdominal exam  (+)   Peds negative pediatric ROS (+)  Hematology  (+) anemia ,   Anesthesia Other Findings   Reproductive/Obstetrics                            Anesthesia Physical Anesthesia Plan  ASA: III  Anesthesia Plan: General   Post-op Pain Management:    Induction: Intravenous  Airway Management Planned: Nasal Cannula and LMA  Additional Equipment:   Intra-op Plan:   Post-operative Plan: Extubation in OR  Informed Consent: I have reviewed the patients History and Physical, chart, labs and discussed the procedure including the risks, benefits and alternatives for the proposed anesthesia with the patient or authorized representative who has indicated his/her understanding and acceptance.   Dental advisory given  Plan Discussed with: CRNA and Surgeon  Anesthesia Plan Comments:        Anesthesia Quick Evaluation

## 2015-04-17 NOTE — Anesthesia Procedure Notes (Signed)
Anesthesia Regional Block:  Bier block (IV Regional)  Pre-Anesthetic Checklist: ,, timeout performed, Correct Patient, Correct Site, Correct Laterality, Correct Procedure, Correct Position, site marked, Risks and benefits discussed,  Surgical consent,  Pre-op evaluation,  At surgeon's request and post-op pain management  Laterality: Left  Prep: Betadine       Needles:   Needle Type: Other          Additional Needles:  Motor weakness within 5 minutes. Bier block (IV Regional) Narrative:   Additional Notes: Patient prepped for Bier block.  Performed as above with 60 cc of 0.5% Lidocaine plain.  Tourniquet set to 300 on both cuffs.  Good motor weakness

## 2015-04-17 NOTE — OR Nursing (Signed)
Dr Hyacinth Meeker states pt OK to go home without voiding. States Ok for pt to keep appt on Monday instead of making appt for Friday.

## 2015-04-17 NOTE — OR Nursing (Signed)
Up in recliner, son at bedside. Encouraged to cough and deep breathe.

## 2015-04-17 NOTE — Op Note (Signed)
04/17/2015  1:22 PM  PATIENT:  Angel Cruz    PRE-OPERATIVE DIAGNOSIS:  CARPAL TUNNEL SYNDROME, CUBITAL TUNNEL SYNDROME. PRIOR ULNAR NEUROLYSIS AND CARPAL TUNNEL RELEASES  POST-OPERATIVE DIAGNOSIS:  Same  PROCEDURE: REPEAT LEFT CARPAL TUNNEL/ULNAR TUNNEL  RELEASE,  ULNAR NERVE TRANSPOSISTION LEFT WITH NEUROLYSIS  SURGEON:  Dalissa Lovin E, MD  COMPLICATIONS:   NONE  EBL:  NONE  TOURNIQUET TIME:   99 min  ANESTHESIA:  IV regional  PREOPERATIVE INDICATIONS:  [name] is a  60 y.o. male with a diagnosis of CARPAL TUNNEL SYNDROME,LESION OF ULNAR NERVE who failed conservative measures and elected for surgical management.    The risks benefits and alternatives were discussed with the patient preoperatively including but not limited to the risks of infection, bleeding, nerve injury, cardiopulmonary complications, the need for revision surgery, among others, and the patient was willing to proceed.  OPERATIVE IMPLANTS: none  OPERATIVE FINDINGS: Extreme scarring ulnar nerve in cubital tunnel.  Scarring and narrowing of median nerve  OPERATIVE FINDINGS: Adhesions around the ulnar nerve at the elbow  OPERATIVE PROCEDURE: The patient was brought to the operating room where she underwent satisfactory IV regional anesthesia in the supine position. The arm was prepped and draped in a sterile fashion. An Esmarch was applied and tourniquet inflated to 250 mmHg. A posterior medial incision was made at the elbow, centered over the epicondyle. Dissection was carried out through subcutaneous tissue down to fascia. There was thick scar tissue surrounding the cubital tunnel. The nerve was identified above and below the scarred areas. The fascia over the ulnar nerve was carefully opened and then released proximally and distally under direct vision. Blunt dissection was carried out proximally and distally to free the nerve up. The nerve was then carefully freed up from adhesions and transferred  anteriorly over the medial epicondyle. The subcutaneous tissue was then sutured to the epicondyles with 0 Vicryls sutures. The nerve was seen to be under little or no tension proximally and distally with no sharp edges. Range of motion was satisfactory. Wound was irrigated and subcutaneous tissue closed with 2-0 Vicryls. Skin was closed with staples. Half percent Marcaine was infiltrated.    In the hand, an incision was made in line with the radial border of the ring finger. The carpal tunnel transverse fascia was identified, cleaned, and incised sharply. There was again thick scar tissue. The common sensory branches were visualized along with the superficial palmar arch and protected.  The median nerve was protected below. Deep retractors were placed underneath the transverse carpal ligament, protecting the nerve. I released the ligament completely, and then released the proximal distal volar forearm fascia. The nerve was identified, and visualized, and protected throughout the case. No masses or abnormalities were identified in ulnar bursa. The ulnar nerve was unroofed as well.  The wounds were irrigated copiously, and the wounds injected, and the skin closed with nylon followed by a sugar tong splint and sterile gauze. Sponge and needle counts were correct.  The patient tolerated this well, with no complications. Awakened and taken to recovery in good condition.   Valinda Hoar, MD

## 2015-04-17 NOTE — Consult Note (Signed)
[facility] [place] at [facility]   PATIENT NAME: Angel Cruz    MR#:  [id number]  DATE OF BIRTH:  18-Jan-1956  DATE OF ADMISSION:  04/17/2015  PRIMARY CARE PHYSICIAN: Ruel Favors, MD   REQUESTING/REFERRING PHYSICIAN: DR Darcey Nora anesthesia  CHIEF COMPLAINT:  No chief complaint on file. Called For hypoxia while lying flat.  HISTORY OF PRESENT ILLNESS:  Angel Cruz  is a 59 y.o. male with a known history of diabetes, chronic kidney disease stage III, history of CHF. He was going to have an elective procedure today by Dr. Deeann Saint. Anesthesia was concerned when his pulse ox dropped down into the 80s while lying flat. The patient does not complain of any shortness of breath. He has a slight cough and occasional wheeze. When I saw him he was breathing comfortably nasal cannula. I took him off oxygen.  PAST MEDICAL HISTORY:   Past Medical History  Diagnosis Date  . Diabetes mellitus without complication (HCC)   . Hypertension   . Hyperlipidemia   . Depression   . Rotator cuff tear   . CKD (chronic kidney disease), stage III   . Ischemic cardiomyopathy     a. 10/2013 Echo: EF 35-40%, severe distal anteroseptal, anterior, and apical HK. Diast dysfxn, mild conc LVH, mildly dil LA, mild Ao sclerosis w/o stenosis.  Marland Kitchen CAD (coronary artery disease)     a. 10/2013 Lexi MV: EF 31%, apical, septal, ant-apical, inf-apical, lat-apical scar, septal and apical peri-infarct ischemia;  b. 10/2013 Cath: LM nl, LAD 20ost, 164m (faint R->L collats), D1 80p, LCX min irregs, OM1 min irregs, OM2 nl, OM3 30p, RCA 20p, PDA 60, RPL min irregs-->Med Rx.  . Morbid obesity (HCC)   . CHF (congestive heart failure) (HCC)   . OSA (obstructive sleep apnea)   . Sleep apnea     recent test confirmed diagnosis, was performed at the sleep center across the street  . Hypotension   . CHF (congestive heart failure) (HCC)   . Myocardial infarction [facility])     PAST SURGICAL HISTOIRY:    Past Surgical History  Procedure Laterality Date  . Cardiac catheterization  10/19/2013    [facility]  . Uvulectomy  1995  . Atrial fibrillation ablation  1995  . Elbow surgery Left   . Rotator cuff repair    . Carpal tunnel release      SOCIAL HISTORY:   Social History  Substance Use Topics  . Smoking status: Never Smoker   . Smokeless tobacco: Never Used  . Alcohol Use: No    FAMILY HISTORY:   Family History  Problem Relation Age of Onset  . Stroke Mother   . COPD Mother   . Heart failure Mother   . Heart attack Mother   . Colon cancer Father   . Diabetes Father   . Diabetes Brother   . Cancer Brother     DRUG ALLERGIES:   Allergies  Allergen Reactions  . Ace Inhibitors     Kidney failure  . Valsartan Other (See Comments)    Kidney failure    REVIEW OF SYSTEMS:  CONSTITUTIONAL: No fever, fatigue or weakness. Some weight gain EYES: No blurred or double vision.  EARS, NOSE, AND THROAT: No tinnitus or ear pain.  RESPIRATORY: Some cough,  no shortness of breath,  occasional wheezing, no hemoptysis.  CARDIOVASCULAR: No chest pain, orthopnea, edema.  GASTROINTESTINAL: No nausea, vomiting, diarrhea or abdominal pain.  GENITOURINARY: No dysuria, hematuria.  ENDOCRINE: No  polyuria, nocturia,  HEMATOLOGY: No anemia, easy bruising or bleeding SKIN: No rash or lesion. MUSCULOSKELETAL: No joint pain or arthritis.   NEUROLOGIC: No tingling, numbness, weakness.  PSYCHIATRY: No anxiety or depression.   MEDICATIONS AT HOME:   Prior to Admission medications   Medication Sig Start Date End Date Taking? Authorizing Provider  amLODipine (NORVASC) 5 MG tablet Take 1 tablet (5 mg total) by mouth daily. Patient taking differently: Take 5 mg by mouth 2 (two) times daily.  10/31/14  Yes Katharina Caper, MD  carvedilol (COREG) 6.25 MG tablet Take 0.5 tablets (3.125 mg total) by mouth 2 (two) times daily with a meal. 04/16/15  Yes Alba Cory, MD  CRESTOR 20 MG tablet TAKE 1  TABLET(20 MG) BY MOUTH AT BEDTIME 10/22/14  Yes Alba Cory, MD  Cyanocobalamin (VITAMIN B-12 PO) Take 1 tablet by mouth daily.   Yes Historical Provider, MD  escitalopram (LEXAPRO) 10 MG tablet Take 1 tablet (10 mg total) by mouth daily. Patient taking differently: Take 10 mg by mouth at bedtime.  02/05/15  Yes Alba Cory, MD  Insulin Degludec (TRESIBA FLEXTOUCH) 200 UNIT/ML SOPN Inject 3 Units into the skin every evening.   Yes Historical Provider, MD  rOPINIRole (REQUIP) 0.5 MG tablet TAKE 1 TABLET(0.5 MG) BY MOUTH AT BEDTIME 01/30/15  Yes Alba Cory, MD  traZODone (DESYREL) 50 MG tablet TAKE 1/2 TO 1 AND 1/2 TABLETS(25 TO 75 MG) BY MOUTH AT BEDTIME AS NEEDED FOR SLEEP 02/05/15  Yes Alba Cory, MD  Vitamin D, Ergocalciferol, (DRISDOL) 50000 units CAPS capsule TK ONE C PO  Q WEEK 02/25/15  Yes Historical Provider, MD  aspirin EC 81 MG tablet Take 81 mg by mouth daily.    Historical Provider, MD  B-D UF III MINI PEN NEEDLES 31G X 5 MM MISC USE AS DIRECTED ONCE DAILY 01/25/15   Alba Cory, MD  nitroGLYCERIN (NITROSTAT) 0.4 MG SL tablet Place 0.4 mg under the tongue every 5 (five) minutes as needed for chest pain. Reported on 04/17/2015    Historical Provider, MD  sodium bicarbonate 650 MG tablet Take 1 tablet by mouth 2 (two) times daily. Reported on 04/17/2015 03/18/15   Lamont Dowdy, MD      VITAL SIGNS:  Blood pressure 147/93, pulse 72, temperature 98.3 F (36.8 C), temperature source Oral, resp. rate 23, height 5\' 7"  (1.702 m), weight 112.492 kg (248 lb), SpO2 93 %.  PHYSICAL EXAMINATION:  GENERAL:  60 y.o.-year-old patient lying in the bed with no acute distress.  EYES: Pupils equal, round, reactive to light and accommodation. No scleral icterus. Extraocular muscles intact.  HEENT: Head atraumatic, normocephalic. Oropharynx and nasopharynx clear.  NECK:  Supple, no jugular venous distention. No thyroid enlargement, no tenderness.  LUNGS: Normal breath sounds  bilaterally, no wheezing, rales,rhonchi or crepitation. No use of accessory muscles of respiration.  CARDIOVASCULAR: S1, S2 normal. No murmurs, rubs, or gallops.  ABDOMEN: Soft, nontender, nondistended. Bowel sounds present. No organomegaly or mass.  EXTREMITIES: trace pedal edema,  no cyanosis, or clubbing.  NEUROLOGIC: Cranial nerves II through XII are intact. Muscle strength 5/5 in all extremities. Sensation intact. Gait not checked.  PSYCHIATRIC: The patient is alert and oriented x 3.  SKIN: No obvious rash, lesion, or ulcer.   LABORATORY PANEL:   CBC  Recent Labs Lab 04/10/15 0947  WBC 5.2  HGB 10.5*  HCT 31.1*  PLT 154   ------------------------------------------------------------------------------------------------------------------  Chemistries   Recent Labs Lab 04/10/15 0947  NA 140  K  4.1  CL 109  CO2 26  GLUCOSE 167*  BUN 36*  CREATININE 2.04*  CALCIUM 8.9   ------------------------------------------------------------------------------------------------------------------    IMPRESSION AND PLAN:   1. Hypoxia while lying flat. I took the patient's oxygen off and his pulse ox held on his own. I walked the patient around the nursing station twice in his pulse ox held above 90%. I then lied the patient flat and the patient's pulse ox was greater than 90%. No indications for admission at this time. Patient does have a history of sleep apnea. Case discussed with Dr. Darcey Nora anesthesia and he will determine what to do next. 2. Type 2 diabetes mellitus- finger back on his usual medications 3. Essential hypertension, history of heart failure- no current signs of heart failure 4. Chronic kidney disease stage III- continue to monitor as outpatient  All the records are reviewed and case discussed with Consulting provider. Management plans discussed with the patient, family and they are in agreement.  CODE STATUS: Full code  TOTAL TIME TAKING CARE OF THIS PATIENT: 50  minutes.    Alford Highland M.D on 04/17/2015 at 8:36 AM  Between 7am to 6pm - Pager - [phone]  After 6pm go to [email or web address] - password [email or web address] [facility]  [place] [facility]  Office  [phone]  CC: Primary care Physician: Ruel Favors, MD

## 2015-04-17 NOTE — Discharge Instructions (Signed)
AMBULATORY SURGERY  DISCHARGE INSTRUCTIONS   1) The drugs that you were given will stay in your system until tomorrow so for the next 24 hours you should not:  A) Drive an automobile B) Make any legal decisions C) Drink any alcoholic beverage   2) You may resume regular meals tomorrow.  Today it is better to start with liquids and gradually work up to solid foods.  You may eat anything you prefer, but it is better to start with liquids, then soup and crackers, and gradually work up to solid foods.   3) Please notify your doctor immediately if you have any unusual bleeding, trouble breathing, redness and pain at the surgery site, drainage, fever, or pain not relieved by medication.    4) Additional Instructions: AMBULATORY SURGERY  DISCHARGE INSTRUCTIONS   5) The drugs that you were given will stay in your system until tomorrow so for the next 24 hours you should not:  D) Drive an automobile E) Make any legal decisions F) Drink any alcoholic beverage   6) You may resume regular meals tomorrow.  Today it is better to start with liquids and gradually work up to solid foods.  You may eat anything you prefer, but it is better to start with liquids, then soup and crackers, and gradually work up to solid foods.   7) Please notify your doctor immediately if you have any unusual bleeding, trouble breathing, redness and pain at the surgery site, drainage, fever, or pain not relieved by medication.    8) Additional Instructions:        Please contact your physician with any problems or Same Day Surgery at [phone], Monday through Friday 6 am to 4 pm, or [place] [facility] [place] Main number at [phone].  Please contact your physician with any problems or Same Day Surgery at [phone], Monday through Friday 6 am to 4 pm, or [place] [facility] [place] Main number at [phone]. 

## 2015-04-18 ENCOUNTER — Encounter: Payer: Self-pay | Admitting: Specialist

## 2015-04-18 ENCOUNTER — Inpatient Hospital Stay (HOSPITAL_COMMUNITY): Payer: Medicaid Other

## 2015-04-18 ENCOUNTER — Emergency Department (HOSPITAL_COMMUNITY): Payer: Medicaid Other

## 2015-04-18 ENCOUNTER — Inpatient Hospital Stay (HOSPITAL_COMMUNITY)
Admission: EM | Admit: 2015-04-18 | Discharge: 2015-05-11 | DRG: 296 | Disposition: E | Payer: Medicaid Other | Attending: Pulmonary Disease | Admitting: Pulmonary Disease

## 2015-04-18 DIAGNOSIS — I2582 Chronic total occlusion of coronary artery: Secondary | ICD-10-CM | POA: Diagnosis present

## 2015-04-18 DIAGNOSIS — Z01818 Encounter for other preprocedural examination: Secondary | ICD-10-CM

## 2015-04-18 DIAGNOSIS — N183 Chronic kidney disease, stage 3 (moderate): Secondary | ICD-10-CM | POA: Diagnosis present

## 2015-04-18 DIAGNOSIS — I252 Old myocardial infarction: Secondary | ICD-10-CM | POA: Diagnosis not present

## 2015-04-18 DIAGNOSIS — I469 Cardiac arrest, cause unspecified: Secondary | ICD-10-CM

## 2015-04-18 DIAGNOSIS — I255 Ischemic cardiomyopathy: Secondary | ICD-10-CM | POA: Diagnosis present

## 2015-04-18 DIAGNOSIS — E875 Hyperkalemia: Secondary | ICD-10-CM | POA: Diagnosis present

## 2015-04-18 DIAGNOSIS — Z6839 Body mass index (BMI) 39.0-39.9, adult: Secondary | ICD-10-CM | POA: Diagnosis not present

## 2015-04-18 DIAGNOSIS — I5031 Acute diastolic (congestive) heart failure: Secondary | ICD-10-CM | POA: Diagnosis present

## 2015-04-18 DIAGNOSIS — R402112 Coma scale, eyes open, never, at arrival to emergency department: Secondary | ICD-10-CM | POA: Diagnosis present

## 2015-04-18 DIAGNOSIS — E785 Hyperlipidemia, unspecified: Secondary | ICD-10-CM | POA: Diagnosis present

## 2015-04-18 DIAGNOSIS — E872 Acidosis: Secondary | ICD-10-CM | POA: Diagnosis present

## 2015-04-18 DIAGNOSIS — J9602 Acute respiratory failure with hypercapnia: Secondary | ICD-10-CM

## 2015-04-18 DIAGNOSIS — E1122 Type 2 diabetes mellitus with diabetic chronic kidney disease: Secondary | ICD-10-CM | POA: Diagnosis present

## 2015-04-18 DIAGNOSIS — Z66 Do not resuscitate: Secondary | ICD-10-CM | POA: Diagnosis not present

## 2015-04-18 DIAGNOSIS — Z7982 Long term (current) use of aspirin: Secondary | ICD-10-CM

## 2015-04-18 DIAGNOSIS — E11649 Type 2 diabetes mellitus with hypoglycemia without coma: Secondary | ICD-10-CM | POA: Diagnosis not present

## 2015-04-18 DIAGNOSIS — F329 Major depressive disorder, single episode, unspecified: Secondary | ICD-10-CM | POA: Diagnosis present

## 2015-04-18 DIAGNOSIS — R402212 Coma scale, best verbal response, none, at arrival to emergency department: Secondary | ICD-10-CM | POA: Diagnosis present

## 2015-04-18 DIAGNOSIS — Z794 Long term (current) use of insulin: Secondary | ICD-10-CM | POA: Diagnosis not present

## 2015-04-18 DIAGNOSIS — G9341 Metabolic encephalopathy: Secondary | ICD-10-CM | POA: Diagnosis present

## 2015-04-18 DIAGNOSIS — G4733 Obstructive sleep apnea (adult) (pediatric): Secondary | ICD-10-CM | POA: Diagnosis present

## 2015-04-18 DIAGNOSIS — R402312 Coma scale, best motor response, none, at arrival to emergency department: Secondary | ICD-10-CM | POA: Diagnosis present

## 2015-04-18 DIAGNOSIS — Z79899 Other long term (current) drug therapy: Secondary | ICD-10-CM | POA: Diagnosis not present

## 2015-04-18 DIAGNOSIS — G931 Anoxic brain damage, not elsewhere classified: Secondary | ICD-10-CM | POA: Diagnosis present

## 2015-04-18 DIAGNOSIS — Z888 Allergy status to other drugs, medicaments and biological substances status: Secondary | ICD-10-CM | POA: Diagnosis not present

## 2015-04-18 DIAGNOSIS — I2510 Atherosclerotic heart disease of native coronary artery without angina pectoris: Secondary | ICD-10-CM | POA: Diagnosis present

## 2015-04-18 DIAGNOSIS — I340 Nonrheumatic mitral (valve) insufficiency: Secondary | ICD-10-CM

## 2015-04-18 DIAGNOSIS — I130 Hypertensive heart and chronic kidney disease with heart failure and stage 1 through stage 4 chronic kidney disease, or unspecified chronic kidney disease: Secondary | ICD-10-CM | POA: Diagnosis present

## 2015-04-18 DIAGNOSIS — G253 Myoclonus: Secondary | ICD-10-CM | POA: Diagnosis present

## 2015-04-18 DIAGNOSIS — D6489 Other specified anemias: Secondary | ICD-10-CM | POA: Diagnosis present

## 2015-04-18 DIAGNOSIS — G936 Cerebral edema: Secondary | ICD-10-CM | POA: Diagnosis present

## 2015-04-18 DIAGNOSIS — N170 Acute kidney failure with tubular necrosis: Secondary | ICD-10-CM | POA: Diagnosis not present

## 2015-04-18 DIAGNOSIS — Z515 Encounter for palliative care: Secondary | ICD-10-CM | POA: Diagnosis present

## 2015-04-18 DIAGNOSIS — J9600 Acute respiratory failure, unspecified whether with hypoxia or hypercapnia: Secondary | ICD-10-CM | POA: Diagnosis not present

## 2015-04-18 DIAGNOSIS — N179 Acute kidney failure, unspecified: Secondary | ICD-10-CM | POA: Diagnosis not present

## 2015-04-18 DIAGNOSIS — T508X5A Adverse effect of diagnostic agents, initial encounter: Secondary | ICD-10-CM | POA: Diagnosis not present

## 2015-04-18 DIAGNOSIS — J9601 Acute respiratory failure with hypoxia: Secondary | ICD-10-CM | POA: Diagnosis present

## 2015-04-18 DIAGNOSIS — J9690 Respiratory failure, unspecified, unspecified whether with hypoxia or hypercapnia: Secondary | ICD-10-CM

## 2015-04-18 DIAGNOSIS — Z452 Encounter for adjustment and management of vascular access device: Secondary | ICD-10-CM | POA: Insufficient documentation

## 2015-04-18 LAB — COMPREHENSIVE METABOLIC PANEL
ALK PHOS: 80 U/L (ref 38–126)
ALT: 19 U/L (ref 17–63)
AST: 39 U/L (ref 15–41)
Albumin: 2.9 g/dL — ABNORMAL LOW (ref 3.5–5.0)
Anion gap: 14 (ref 5–15)
BILIRUBIN TOTAL: 0.3 mg/dL (ref 0.3–1.2)
BUN: 36 mg/dL — AB (ref 6–20)
CALCIUM: 8.8 mg/dL — AB (ref 8.9–10.3)
CO2: 19 mmol/L — ABNORMAL LOW (ref 22–32)
CREATININE: 3.24 mg/dL — AB (ref 0.61–1.24)
Chloride: 108 mmol/L (ref 101–111)
GFR, EST AFRICAN AMERICAN: 23 mL/min — AB (ref >60)
GFR, EST NON AFRICAN AMERICAN: 20 mL/min — AB (ref >60)
Glucose, Bld: 274 mg/dL — ABNORMAL HIGH (ref 65–99)
Potassium: 4.7 mmol/L (ref 3.5–5.1)
Sodium: 141 mmol/L (ref 135–145)
TOTAL PROTEIN: 5.4 g/dL — AB (ref 6.5–8.1)

## 2015-04-18 LAB — URINALYSIS, ROUTINE W REFLEX MICROSCOPIC
BILIRUBIN URINE: NEGATIVE
GLUCOSE, UA: 100 mg/dL — AB
KETONES UR: NEGATIVE mg/dL
LEUKOCYTES UA: NEGATIVE
Nitrite: NEGATIVE
PH: 5.5 (ref 5.0–8.0)
Specific Gravity, Urine: 1.017 (ref 1.005–1.030)

## 2015-04-18 LAB — CBC WITH DIFFERENTIAL/PLATELET
Basophils Absolute: 0 10*3/uL (ref 0.0–0.1)
Basophils Relative: 0 %
EOS PCT: 3 %
Eosinophils Absolute: 0.3 10*3/uL (ref 0.0–0.7)
HEMATOCRIT: 33.4 % — AB (ref 39.0–52.0)
HEMOGLOBIN: 10.2 g/dL — AB (ref 13.0–17.0)
LYMPHS ABS: 4.9 10*3/uL — AB (ref 0.7–4.0)
LYMPHS PCT: 46 %
MCH: 27.4 pg (ref 26.0–34.0)
MCHC: 30.5 g/dL (ref 30.0–36.0)
MCV: 89.8 fL (ref 78.0–100.0)
Monocytes Absolute: 0.6 10*3/uL (ref 0.1–1.0)
Monocytes Relative: 5 %
NEUTROS ABS: 4.9 10*3/uL (ref 1.7–7.7)
Neutrophils Relative %: 46 %
PLATELETS: 198 10*3/uL (ref 150–400)
RBC: 3.72 MIL/uL — AB (ref 4.22–5.81)
RDW: 14.7 % (ref 11.5–15.5)
WBC: 10.7 10*3/uL — AB (ref 4.0–10.5)

## 2015-04-18 LAB — BASIC METABOLIC PANEL
ANION GAP: 11 (ref 5–15)
BUN: 36 mg/dL — ABNORMAL HIGH (ref 6–20)
CALCIUM: 8.2 mg/dL — AB (ref 8.9–10.3)
CO2: 21 mmol/L — AB (ref 22–32)
Chloride: 109 mmol/L (ref 101–111)
Creatinine, Ser: 3.05 mg/dL — ABNORMAL HIGH (ref 0.61–1.24)
GFR calc non Af Amer: 21 mL/min — ABNORMAL LOW (ref >60)
GFR, EST AFRICAN AMERICAN: 25 mL/min — AB (ref >60)
Glucose, Bld: 229 mg/dL — ABNORMAL HIGH (ref 65–99)
Potassium: 4.3 mmol/L (ref 3.5–5.1)
Sodium: 141 mmol/L (ref 135–145)

## 2015-04-18 LAB — I-STAT CHEM 8, ED
BUN: 44 mg/dL — AB (ref 6–20)
CREATININE: 2.7 mg/dL — AB (ref 0.61–1.24)
Calcium, Ion: 1.19 mmol/L (ref 1.12–1.23)
Chloride: 106 mmol/L (ref 101–111)
GLUCOSE: 259 mg/dL — AB (ref 65–99)
HCT: 34 % — ABNORMAL LOW (ref 39.0–52.0)
HEMOGLOBIN: 11.6 g/dL — AB (ref 13.0–17.0)
Potassium: 4.6 mmol/L (ref 3.5–5.1)
Sodium: 141 mmol/L (ref 135–145)
TCO2: 23 mmol/L (ref 0–100)

## 2015-04-18 LAB — PROTIME-INR
INR: 1.22 (ref 0.00–1.49)
INR: 1.24 (ref 0.00–1.49)
Prothrombin Time: 15.6 seconds — ABNORMAL HIGH (ref 11.6–15.2)
Prothrombin Time: 15.8 seconds — ABNORMAL HIGH (ref 11.6–15.2)

## 2015-04-18 LAB — TROPONIN I: Troponin I: 0.42 ng/mL — ABNORMAL HIGH (ref <0.031)

## 2015-04-18 LAB — URINE MICROSCOPIC-ADD ON: Bacteria, UA: NONE SEEN

## 2015-04-18 LAB — I-STAT ARTERIAL BLOOD GAS, ED
Acid-base deficit: 7 mmol/L — ABNORMAL HIGH (ref 0.0–2.0)
BICARBONATE: 20.8 meq/L (ref 20.0–24.0)
O2 Saturation: 83 %
PH ART: 7.224 — AB (ref 7.350–7.450)
PO2 ART: 52 mmHg — AB (ref 80.0–100.0)
Patient temperature: 35.2
TCO2: 22 mmol/L (ref 0–100)
pCO2 arterial: 49.3 mmHg — ABNORMAL HIGH (ref 35.0–45.0)

## 2015-04-18 LAB — I-STAT TROPONIN, ED: TROPONIN I, POC: 0.03 ng/mL (ref 0.00–0.08)

## 2015-04-18 LAB — I-STAT CG4 LACTIC ACID, ED
LACTIC ACID, VENOUS: 1.83 mmol/L (ref 0.5–2.0)
Lactic Acid, Venous: 6.07 mmol/L (ref 0.5–2.0)

## 2015-04-18 LAB — BRAIN NATRIURETIC PEPTIDE: B Natriuretic Peptide: 268.8 pg/mL — ABNORMAL HIGH (ref 0.0–100.0)

## 2015-04-18 LAB — APTT: APTT: 190 s — AB (ref 24–37)

## 2015-04-18 MED ORDER — PANTOPRAZOLE SODIUM 40 MG IV SOLR
40.0000 mg | Freq: Every day | INTRAVENOUS | Status: DC
  Administered 2015-04-19: 40 mg via INTRAVENOUS
  Filled 2015-04-18: qty 40

## 2015-04-18 MED ORDER — MIDAZOLAM HCL 2 MG/2ML IJ SOLN
2.0000 mg | INTRAMUSCULAR | Status: DC | PRN
  Administered 2015-04-19 (×4): 2 mg via INTRAVENOUS
  Filled 2015-04-18 (×2): qty 2

## 2015-04-18 MED ORDER — FENTANYL CITRATE (PF) 100 MCG/2ML IJ SOLN
100.0000 ug | INTRAMUSCULAR | Status: DC | PRN
  Administered 2015-04-18: 100 ug via INTRAVENOUS

## 2015-04-18 MED ORDER — IOHEXOL 350 MG/ML SOLN
100.0000 mL | Freq: Once | INTRAVENOUS | Status: AC | PRN
  Administered 2015-04-18: 100 mL via INTRAVENOUS

## 2015-04-18 MED ORDER — DEXTROSE 5 % IV SOLN
0.0000 ug/min | INTRAVENOUS | Status: DC
  Administered 2015-04-19: 6 ug/min via INTRAVENOUS
  Administered 2015-04-19: 5 ug/min via INTRAVENOUS
  Administered 2015-04-20 (×2): 11 ug/min via INTRAVENOUS
  Filled 2015-04-18 (×4): qty 4

## 2015-04-18 MED ORDER — MIDAZOLAM HCL 2 MG/2ML IJ SOLN
2.0000 mg | INTRAMUSCULAR | Status: DC | PRN
  Administered 2015-04-18: 2 mg via INTRAVENOUS
  Filled 2015-04-18 (×4): qty 2

## 2015-04-18 MED ORDER — FENTANYL CITRATE (PF) 100 MCG/2ML IJ SOLN
100.0000 ug | INTRAMUSCULAR | Status: DC | PRN
  Administered 2015-04-18: 100 ug via INTRAVENOUS
  Filled 2015-04-18 (×2): qty 2

## 2015-04-18 MED ORDER — EPINEPHRINE HCL 0.1 MG/ML IJ SOSY
PREFILLED_SYRINGE | INTRAMUSCULAR | Status: AC | PRN
  Administered 2015-04-18 (×3): 1 mg via INTRAVENOUS

## 2015-04-18 MED ORDER — HEPARIN BOLUS VIA INFUSION
6000.0000 [IU] | Freq: Once | INTRAVENOUS | Status: AC
  Administered 2015-04-18: 6000 [IU] via INTRAVENOUS
  Filled 2015-04-18: qty 6000

## 2015-04-18 MED ORDER — IPRATROPIUM-ALBUTEROL 0.5-2.5 (3) MG/3ML IN SOLN
3.0000 mL | Freq: Four times a day (QID) | RESPIRATORY_TRACT | Status: DC
  Administered 2015-04-19 – 2015-04-20 (×6): 3 mL via RESPIRATORY_TRACT
  Filled 2015-04-18 (×6): qty 3

## 2015-04-18 MED ORDER — BUDESONIDE 0.5 MG/2ML IN SUSP
0.5000 mg | Freq: Two times a day (BID) | RESPIRATORY_TRACT | Status: DC
  Administered 2015-04-19 – 2015-04-20 (×4): 0.5 mg via RESPIRATORY_TRACT
  Filled 2015-04-18 (×4): qty 2

## 2015-04-18 MED ORDER — HEPARIN (PORCINE) IN NACL 100-0.45 UNIT/ML-% IJ SOLN
1600.0000 [IU]/h | INTRAMUSCULAR | Status: DC
  Administered 2015-04-18 – 2015-04-19 (×2): 1600 [IU]/h via INTRAVENOUS
  Filled 2015-04-18: qty 250

## 2015-04-18 MED ORDER — SODIUM CHLORIDE 0.9 % IV SOLN: 2000.0000 mL | Freq: Once | INTRAVENOUS | Status: AC

## 2015-04-18 MED ORDER — ASPIRIN 300 MG RE SUPP
300.0000 mg | RECTAL | Status: AC
  Administered 2015-04-18: 300 mg via RECTAL
  Filled 2015-04-18: qty 1

## 2015-04-18 MED ORDER — SODIUM CHLORIDE 0.9 % IV SOLN: INTRAVENOUS | Status: DC

## 2015-04-18 MED ORDER — EPINEPHRINE HCL 1 MG/ML IJ SOLN
0.5000 ug/min | INTRAVENOUS | Status: DC
  Filled 2015-04-18: qty 4

## 2015-04-18 NOTE — ED Notes (Signed)
TEE in progress

## 2015-04-18 NOTE — Progress Notes (Addendum)
ANTICOAGULATION CONSULT NOTE - Initial Consult  Pharmacy Consult for Heparin  Indication: initially pulmonary embolus now ACS/chest pain  Allergies  Allergen Reactions  . Ace Inhibitors     Kidney failure  . Valsartan Other (See Comments)    Kidney failure     Vital Signs: BP: 120/71 mmHg (03/09 1845) Pulse Rate: 65 (03/09 1845)  Labs:  Recent Labs  04/16/2015 1748 04/21/2015 1801  HGB 10.2* 11.6*  HCT 33.4* 34.0*  PLT 198  --   LABPROT 15.6*  --   INR 1.22  --   CREATININE 3.24* 2.70*  TROPONINI <0.03  --     Estimated Creatinine Clearance: 36.2 mL/min (by C-G formula based on Cr of 2.7).   Medical History: Past Medical History  Diagnosis Date  . Diabetes mellitus without complication (HCC)   . Hypertension   . Hyperlipidemia   . Depression   . Rotator cuff tear   . CKD (chronic kidney disease), stage III   . Ischemic cardiomyopathy     a. 10/2013 Echo: EF 35-40%, severe distal anteroseptal, anterior, and apical HK. Diast dysfxn, mild conc LVH, mildly dil LA, mild Ao sclerosis w/o stenosis.  Marland Kitchen CAD (coronary artery disease)     a. 10/2013 Lexi MV: EF 31%, apical, septal, ant-apical, inf-apical, lat-apical scar, septal and apical peri-infarct ischemia;  b. 10/2013 Cath: LM nl, LAD 20ost, 167m (faint R->L collats), D1 80p, LCX min irregs, OM1 min irregs, OM2 nl, OM3 30p, RCA 20p, PDA 60, RPL min irregs-->Med Rx.  . Morbid obesity (HCC)   . CHF (congestive heart failure) (HCC)   . OSA (obstructive sleep apnea)   . Sleep apnea     recent test confirmed diagnosis, was performed at the sleep center across the street  . Hypotension   . CHF (congestive heart failure) (HCC)   . Myocardial infarction [facility])     Assessment: 59 yom admitted 04/12/2015  With SOB. Pharmacy consulted to dose heparin for pulmonary embolism, now consult for ACS/Chest pain. No AC pta. Hgb 11.6, PLT 198   Goal of Therapy:  Heparin level 0.3-0.7 units/ml Monitor platelets by anticoagulation  protocol: Yes   Plan:  Heparin 6,000 unit bolus  Heparin 1600 units/hr  6hr HL  Daily HL/CBC  Monitor for s/s of bleeding  Alashia Brownfield C. Marvis Moeller, PharmD Pharmacy Resident  Pager: [phone] 04/17/2015 7:51 PM

## 2015-04-18 NOTE — ED Provider Notes (Signed)
CSN: [id number]     Arrival date & time 04/25/2015  1741 History   First MD Initiated Contact with Patient 04/29/2015 1755     Chief Complaint  Patient presents with  . Cardiac Arrest     (Consider location/radiation/quality/duration/timing/severity/associated sxs/prior Treatment) Patient is a 60 y.o. male presenting with general illness. The history is provided by the EMS personnel.  Illness Location:  Cardiac Arrest Severity:  Severe Onset quality:  Sudden Duration: first episode occured 45 minutes prior to arrival, intermittiant ROSC. Timing:  Intermittent Progression:  Waxing and waning Chronicity:  New Context:  Patient with hx of CAD, CKD, recent surrgery yesterday was working in yard when he syncopized found to be in cardiac arrest  Relieved by:  Epinephrine Associated symptoms: loss of consciousness   Associated symptoms: no fever and no vomiting     Past Medical History  Diagnosis Date  . Diabetes mellitus without complication (HCC)   . Hypertension   . Hyperlipidemia   . Depression   . Rotator cuff tear   . CKD (chronic kidney disease), stage III   . Ischemic cardiomyopathy     a. 10/2013 Echo: EF 35-40%, severe distal anteroseptal, anterior, and apical HK. Diast dysfxn, mild conc LVH, mildly dil LA, mild Ao sclerosis w/o stenosis.  Marland Kitchen CAD (coronary artery disease)     a. 10/2013 Lexi MV: EF 31%, apical, septal, ant-apical, inf-apical, lat-apical scar, septal and apical peri-infarct ischemia;  b. 10/2013 Cath: LM nl, LAD 20ost, 139m (faint R->L collats), D1 80p, LCX min irregs, OM1 min irregs, OM2 nl, OM3 30p, RCA 20p, PDA 60, RPL min irregs-->Med Rx.  . Morbid obesity (HCC)   . CHF (congestive heart failure) (HCC)   . OSA (obstructive sleep apnea)   . Sleep apnea     recent test confirmed diagnosis, was performed at the sleep center across the street  . Hypotension   . CHF (congestive heart failure) (HCC)   . Myocardial infarction [facility])    Past Surgical History   Procedure Laterality Date  . Cardiac catheterization  10/19/2013    [facility]  . Uvulectomy  1995  . Atrial fibrillation ablation  1995  . Elbow surgery Left   . Rotator cuff repair    . Carpal tunnel release    . Carpal tunnel release Left 04/17/2015    Procedure: CARPAL TUNNEL RELEASE;  Surgeon: Deeann Saint, MD;  Location: [facility] ORS;  Service: Orthopedics;  Laterality: Left;  . Tennis elbow release/nirschel procedure Left 04/17/2015    Procedure: TENNIS ELBOW RELEASE/NIRSCHEL PROCEDURE;  Surgeon: Deeann Saint, MD;  Location: [facility] ORS;  Service: Orthopedics;  Laterality: Left;   Family History  Problem Relation Age of Onset  . Stroke Mother   . COPD Mother   . Heart failure Mother   . Heart attack Mother   . Colon cancer Father   . Diabetes Father   . Diabetes Brother   . Cancer Brother    Social History  Substance Use Topics  . Smoking status: Never Smoker   . Smokeless tobacco: Never Used  . Alcohol Use: No    Review of Systems  Unable to perform ROS: Patient unresponsive  Constitutional: Negative for fever.  Gastrointestinal: Negative for vomiting.  Neurological: Positive for loss of consciousness.      Allergies  Ace inhibitors and Valsartan  Home Medications   Prior to Admission medications   Medication Sig Start Date End Date Taking? Authorizing Provider  amLODipine (NORVASC) 5 MG tablet Take 1 tablet (  5 mg total) by mouth daily. Patient taking differently: Take 5 mg by mouth 2 (two) times daily.  10/31/14   Katharina Caper, MD  aspirin EC 81 MG tablet Take 81 mg by mouth daily.    Historical Provider, MD  B-D UF III MINI PEN NEEDLES 31G X 5 MM MISC USE AS DIRECTED ONCE DAILY 01/25/15   Alba Cory, MD  carvedilol (COREG) 6.25 MG tablet Take 0.5 tablets (3.125 mg total) by mouth 2 (two) times daily with a meal. 04/16/15   Alba Cory, MD  CRESTOR 20 MG tablet TAKE 1 TABLET(20 MG) BY MOUTH AT BEDTIME 10/22/14   Alba Cory, MD  Cyanocobalamin (VITAMIN B-12 PO)  Take 1 tablet by mouth daily.    Historical Provider, MD  escitalopram (LEXAPRO) 10 MG tablet Take 1 tablet (10 mg total) by mouth daily. Patient taking differently: Take 10 mg by mouth at bedtime.  02/05/15   Alba Cory, MD  gabapentin (NEURONTIN) 400 MG capsule Take 1 capsule (400 mg total) by mouth 3 (three) times daily. 04/17/15   Deeann Saint, MD  HYDROcodone-acetaminophen (NORCO) 5-325 MG tablet Take 1-2 tablets by mouth every 6 (six) hours as needed. 04/17/15   Deeann Saint, MD  Insulin Degludec (TRESIBA FLEXTOUCH) 200 UNIT/ML SOPN Inject 3 Units into the skin every evening.    Historical Provider, MD  meloxicam (MOBIC) 15 MG tablet Take 1 tablet (15 mg total) by mouth daily. 04/17/15   Deeann Saint, MD  nitroGLYCERIN (NITROSTAT) 0.4 MG SL tablet Place 0.4 mg under the tongue every 5 (five) minutes as needed for chest pain. Reported on 04/17/2015    Historical Provider, MD  rOPINIRole (REQUIP) 0.5 MG tablet TAKE 1 TABLET(0.5 MG) BY MOUTH AT BEDTIME 01/30/15   Alba Cory, MD  sodium bicarbonate 650 MG tablet Take 1 tablet by mouth 2 (two) times daily. Reported on 04/17/2015 03/18/15   Lamont Dowdy, MD  traZODone (DESYREL) 50 MG tablet TAKE 1/2 TO 1 AND 1/2 TABLETS(25 TO 75 MG) BY MOUTH AT BEDTIME AS NEEDED FOR SLEEP 02/05/15   Alba Cory, MD  Vitamin D, Ergocalciferol, (DRISDOL) 50000 units CAPS capsule TK ONE C PO  Q WEEK 02/25/15   Historical Provider, MD   BP 127/72 mmHg  Pulse 66  Resp 21  SpO2 91% Physical Exam  Constitutional:  Middle aged caucasian male currently undergoing CPR by Lucus machine on arrival with EMS  HENT:  Head: Normocephalic and atraumatic.  Right Ear: External ear normal.  Left Ear: External ear normal.  Eyes: Right eye exhibits no discharge. Left eye exhibits no discharge.  Pupils 58mm round and unreactive bilaterally   Neck: No JVD present. No tracheal deviation present.  Cardiovascular:  Femoral pulses present with compression from LUCUS device   Pulmonary/Chest: He has no rales.  Breath sounds bilaterally with bagging  Abdominal: He exhibits distension. There is no guarding.  Genitourinary: Penis normal.  Neurological: He is unresponsive. GCS eye subscore is 1. GCS verbal subscore is 1. GCS motor subscore is 1.  Skin: Skin is warm and dry.  Nursing note and vitals reviewed.   ED Course  .Intubation Date/Time: 2015/05/10 6:05 PM Performed by: Lula Olszewski Authorized by: Glynn Octave Consent: The procedure was performed in an emergent situation. Patient identity confirmed: hospital-assigned identification number Indications: respiratory failure Intubation method: video-assisted Patient status: unconscious (No RSI drugs used, patient in cardiopulmonary arrest) Laryngoscope size: Mac 3 Tube size: 7.5 mm Tube type: cuffed Number of attempts: 1 Cricoid pressure: no Cords  visualized: yes Post-procedure assessment: chest rise and ETCO2 monitor Breath sounds: equal Cuff inflated: yes ETT to lip: 24 cm Tube secured with: ETT holder Chest x-ray interpreted by me and radiologist. Chest x-ray findings: endotracheal tube in appropriate position Patient tolerance: Patient tolerated the procedure well with no immediate complications   (including critical care time) Labs Review Labs Reviewed  CBC WITH DIFFERENTIAL/PLATELET - Abnormal; Notable for the following:    WBC 10.7 (*)    RBC 3.72 (*)    Hemoglobin 10.2 (*)    HCT 33.4 (*)    Lymphs Abs 4.9 (*)    All other components within normal limits  COMPREHENSIVE METABOLIC PANEL - Abnormal; Notable for the following:    CO2 19 (*)    Glucose, Bld 274 (*)    BUN 36 (*)    Creatinine, Ser 3.24 (*)    Calcium 8.8 (*)    Total Protein 5.4 (*)    Albumin 2.9 (*)    GFR calc non Af Amer 20 (*)    GFR calc Af Amer 23 (*)    All other components within normal limits  PROTIME-INR - Abnormal; Notable for the following:    Prothrombin Time 15.6 (*)    All other components  within normal limits  BRAIN NATRIURETIC PEPTIDE - Abnormal; Notable for the following:    B Natriuretic Peptide 268.8 (*)    All other components within normal limits  URINALYSIS, ROUTINE W REFLEX MICROSCOPIC (NOT AT [facility]) - Abnormal; Notable for the following:    APPearance CLOUDY (*)    Glucose, UA 100 (*)    Hgb urine dipstick MODERATE (*)    Protein, ur >300 (*)    All other components within normal limits  URINE MICROSCOPIC-ADD ON - Abnormal; Notable for the following:    Squamous Epithelial / LPF 0-5 (*)    Casts HYALINE CASTS (*)    All other components within normal limits  I-STAT CHEM 8, ED - Abnormal; Notable for the following:    BUN 44 (*)    Creatinine, Ser 2.70 (*)    Glucose, Bld 259 (*)    Hemoglobin 11.6 (*)    HCT 34.0 (*)    All other components within normal limits  I-STAT CG4 LACTIC ACID, ED - Abnormal; Notable for the following:    Lactic Acid, Venous 6.07 (*)    All other components within normal limits  I-STAT ARTERIAL BLOOD GAS, ED - Abnormal; Notable for the following:    pH, Arterial 7.224 (*)    pCO2 arterial 49.3 (*)    pO2, Arterial 52.0 (*)    Acid-base deficit 7.0 (*)    All other components within normal limits  TROPONIN I  HEPARIN LEVEL (UNFRACTIONATED)  HEPARIN LEVEL (UNFRACTIONATED)  CBC  D-DIMER, QUANTITATIVE (NOT AT [facility])  I-STAT TROPOININ, ED  I-STAT CG4 LACTIC ACID, ED    Imaging Review Ct Head Wo Contrast  04/19/2015  CLINICAL DATA:  Cardiac arrest EXAM: CT HEAD WITHOUT CONTRAST TECHNIQUE: Contiguous axial images were obtained from the base of the skull through the vertex without intravenous contrast. COMPARISON:  10/30/2014 FINDINGS: Ventricle size normal. Negative for acute infarct. No edema or mass. No intracranial hemorrhage Calvarium intact. Mucosal edema throughout the paranasal sinuses without air-fluid level. IMPRESSION: Negative CT of the brain. Chronic sinusitis Electronically Signed   By: Marlan Palau M.D.   On: 04/27/2015  19:20   Dg Chest Portable 1 View  04/11/2015  CLINICAL DATA:  Cardiac arrest, ETT,  NGT EXAM: PORTABLE CHEST 1 VIEW COMPARISON:  02/10/2015 FINDINGS: Endotracheal tube is in place, 4.2 cm above carina. Nasogastric tube has been placed, tip beyond the image beyond the lower esophagus. There is elevation of the right hemidiaphragm, more prominent than seen on prior images. Heart is enlarged. There is airspace filling opacity bilaterally but more prominent in the left upper lobe. IMPRESSION: 1. Interval intubation and placement of nasogastric tube. 2. Cardiomegaly and probable edema. 3. Question of infiltrate in the left upper lobe. Electronically Signed   By: Norva Pavlov M.D.   On: 05/04/2015 18:33   I have personally reviewed and evaluated these images and lab results as part of my medical decision-making.   EKG Interpretation None      MDM   Final diagnoses:  Cardiac arrest [facility])    The patient is a 60 year old male with a history of coronary artery disease, CKD, and CHF who presents with CPR in progress after a cardiac arrest. Initial rhythm asystole. CPR has been in progress intermittently for approximately 45 minutes with 2 separate ROSC. On arrival CPR in progress via [place] device. ROSC is achieved after 2 additional minutes of CPR after arrival to the ED. Patient is intubated for airway protection. Cardiology is present at bedside on arrival due to concerns of ST elevation on EKG. Per cardiology and they are not concerned for STEMI and will not take patient to Cath Lab. Please see their note for more detail. Cardiology performs a TEE at bedside and is concerned for pulmonary artery clot. Critical care consultation tonight the patient in the ED. Patient is cooled per hypothermia protocol. Heparin is started once head CT does not show acute bleed. Patient is admitted to the critical care team for further management. The patient's wife and family is updated on patient's status and plan while  he is in the ED.  Patient seen with attending, Dr. Manus Gunning, who oversaw clinical decision making.  Results for orders placed or performed during the hospital encounter of Apr 20, 2015  CBC with Differential  Result Value Ref Range   WBC 10.7 (H) 4.0 - 10.5 K/uL   RBC 3.72 (L) 4.22 - 5.81 MIL/uL   Hemoglobin 10.2 (L) 13.0 - 17.0 g/dL   HCT 16.1 (L) 09.6 - 04.5 %   MCV 89.8 78.0 - 100.0 fL   MCH 27.4 26.0 - 34.0 pg   MCHC 30.5 30.0 - 36.0 g/dL   RDW 40.9 81.1 - 91.4 %   Platelets 198 150 - 400 K/uL   Neutrophils Relative % 46 %   Neutro Abs 4.9 1.7 - 7.7 K/uL   Lymphocytes Relative 46 %   Lymphs Abs 4.9 (H) 0.7 - 4.0 K/uL   Monocytes Relative 5 %   Monocytes Absolute 0.6 0.1 - 1.0 K/uL   Eosinophils Relative 3 %   Eosinophils Absolute 0.3 0.0 - 0.7 K/uL   Basophils Relative 0 %   Basophils Absolute 0.0 0.0 - 0.1 K/uL  Comprehensive metabolic panel  Result Value Ref Range   Sodium 141 135 - 145 mmol/L   Potassium 4.7 3.5 - 5.1 mmol/L   Chloride 108 101 - 111 mmol/L   CO2 19 (L) 22 - 32 mmol/L   Glucose, Bld 274 (H) 65 - 99 mg/dL   BUN 36 (H) 6 - 20 mg/dL   Creatinine, Ser 7.82 (H) 0.61 - 1.24 mg/dL   Calcium 8.8 (L) 8.9 - 10.3 mg/dL   Total Protein 5.4 (L) 6.5 - 8.1 g/dL   Albumin  2.9 (L) 3.5 - 5.0 g/dL   AST 39 15 - 41 U/L   ALT 19 17 - 63 U/L   Alkaline Phosphatase 80 38 - 126 U/L   Total Bilirubin 0.3 0.3 - 1.2 mg/dL   GFR calc non Af Amer 20 (L) >60 mL/min   GFR calc Af Amer 23 (L) >60 mL/min   Anion gap 14 5 - 15  Troponin I  Result Value Ref Range   Troponin I <0.03 <0.031 ng/mL  Protime-INR  Result Value Ref Range   Prothrombin Time 15.6 (H) 11.6 - 15.2 seconds   INR 1.22 0.00 - 1.49  Brain natriuretic peptide  Result Value Ref Range   B Natriuretic Peptide 268.8 (H) 0.0 - 100.0 pg/mL  Urinalysis, Routine w reflex microscopic (not at [facility])  Result Value Ref Range   Color, Urine YELLOW YELLOW   APPearance CLOUDY (A) CLEAR   Specific Gravity, Urine 1.017 1.005  - 1.030   pH 5.5 5.0 - 8.0   Glucose, UA 100 (A) NEGATIVE mg/dL   Hgb urine dipstick MODERATE (A) NEGATIVE   Bilirubin Urine NEGATIVE NEGATIVE   Ketones, ur NEGATIVE NEGATIVE mg/dL   Protein, ur >161 (A) NEGATIVE mg/dL   Nitrite NEGATIVE NEGATIVE   Leukocytes, UA NEGATIVE NEGATIVE  Urine microscopic-add on  Result Value Ref Range   Squamous Epithelial / LPF 0-5 (A) NONE SEEN   WBC, UA 0-5 0 - 5 WBC/hpf   RBC / HPF 6-30 0 - 5 RBC/hpf   Bacteria, UA NONE SEEN NONE SEEN   Casts HYALINE CASTS (A) NEGATIVE   Urine-Other AMORPHOUS URATES/PHOSPHATES   I-Stat Troponin, ED (not at [facility])  Result Value Ref Range   Troponin i, poc 0.03 0.00 - 0.08 ng/mL   Comment 3          I-Stat Chem 8, ED  Result Value Ref Range   Sodium 141 135 - 145 mmol/L   Potassium 4.6 3.5 - 5.1 mmol/L   Chloride 106 101 - 111 mmol/L   BUN 44 (H) 6 - 20 mg/dL   Creatinine, Ser 0.96 (H) 0.61 - 1.24 mg/dL   Glucose, Bld 045 (H) 65 - 99 mg/dL   Calcium, Ion 4.09 8.11 - 1.23 mmol/L   TCO2 23 0 - 100 mmol/L   Hemoglobin 11.6 (L) 13.0 - 17.0 g/dL   HCT 91.4 (L) 78.2 - 95.6 %  I-Stat CG4 Lactic Acid, ED  Result Value Ref Range   Lactic Acid, Venous 6.07 (HH) 0.5 - 2.0 mmol/L   Comment NOTIFIED PHYSICIAN   I-Stat arterial blood gas, ED  Result Value Ref Range   pH, Arterial 7.224 (L) 7.350 - 7.450   pCO2 arterial 49.3 (H) 35.0 - 45.0 mmHg   pO2, Arterial 52.0 (L) 80.0 - 100.0 mmHg   Bicarbonate 20.8 20.0 - 24.0 mEq/L   TCO2 22 0 - 100 mmol/L   O2 Saturation 83.0 %   Acid-base deficit 7.0 (H) 0.0 - 2.0 mmol/L   Patient temperature 35.2 C    Collection site ARTERIAL LINE    Drawn by Operator    Sample type ARTERIAL     CT Head Wo Contrast  Final Result    DG Chest Portable 1 View  Final Result    CT Angio Chest PE W/Cm &/Or Wo Cm    (Results Pending)     Lula Olszewski, MD 05/15/15 2055  Glynn Octave, MD 04/19/15 [phone]

## 2015-04-18 NOTE — ED Notes (Signed)
[name] updated by RN and EDP with [place] and to bedside, pt then transported to CT with RN, RT and CR monitor

## 2015-04-18 NOTE — H&P (Signed)
PULMONARY / CRITICAL CARE MEDICINE   Name: Angel Cruz MRN: [id number] DOB: [birth year]    ADMISSION DATE:  04/21/2015 CONSULTATION DATE:  04/15/2015  REFERRING MD:  EDP Dr Rancour  CHIEF COMPLAINT:  Cardiac arrest  HISTORY OF PRESENT ILLNESS:   60 year old male with PMH as below, which includes DM, HTN, HLD, CKD III, Ischemic cardiomyopathy, OSA, and recent carpal tunnel surgery, yesterday 3/8. Surgery complicated by pre-op hypoxia while in supine position to 82% on Room air.Wife states that CHF has been bad recently with increased swelling to BLE with pitting. Hypoxia improved once HOB was raised, but surgery was done without anesthesia due to this risk. Nerve block was used. Otherwise surgery was uncomplicated. He was discharged to home with hydrocodone which was managed by his wife and it would be impossible for him to have taken more than ordered  3/9 He was in yard with family talking to neighbors and suffered a syncopal episode. Family helped him into the house, where he insisted he was fine, but then promptly became unresponsive and lost his pulse per family. 911 was called and family started CPR within 2-3 mins of arrest. Upon EMS arrival CPR was in process. ROSC was achieved at some point en route, but pulses were lost again prior to arrival in ED. Coded for at least 10 mins in ED prior to ROSC. Total downtime unclear due to ROSC en route, however, total time from arrest to final ROSC 45 mins. Cardiology was called with concern for STEMI. They did not think this was STEMI, TEE was performed in ED and noted RMPA thrombus, no RV dilation was noted. Patient could not have CTA due to CKD. Patient requiring low dose epinephrine infusion to maintain blood pressure. PCCM consulted.   PAST MEDICAL HISTORY :  He  has a past medical history of Diabetes mellitus without complication (HCC); Hypertension; Hyperlipidemia; Depression; Rotator cuff tear; CKD (chronic kidney disease), stage III; Ischemic  cardiomyopathy; CAD (coronary artery disease); Morbid obesity (HCC); CHF (congestive heart failure) (HCC); OSA (obstructive sleep apnea); Sleep apnea; Hypotension; CHF (congestive heart failure) (HCC); and Myocardial infarction (HCC).  PAST SURGICAL HISTORY: He  has past surgical history that includes Cardiac catheterization (10/19/2013); Uvulectomy (1995); Atrial fibrillation ablation (1995); Elbow surgery (Left); Rotator cuff repair; Carpal tunnel release; Carpal tunnel release (Left, 04/17/2015); and Tennis elbow release/nirschel procedure (Left, 04/17/2015).  Allergies  Allergen Reactions  . Ace Inhibitors     Kidney failure  . Valsartan Other (See Comments)    Kidney failure    No current facility-administered medications on file prior to encounter.   Current Outpatient Prescriptions on File Prior to Encounter  Medication Sig  . amLODipine (NORVASC) 5 MG tablet Take 1 tablet (5 mg total) by mouth daily. (Patient taking differently: Take 5 mg by mouth 2 (two) times daily. )  . aspirin EC 81 MG tablet Take 81 mg by mouth daily.  . B-D UF III MINI PEN NEEDLES 31G X 5 MM MISC USE AS DIRECTED ONCE DAILY  . carvedilol (COREG) 6.25 MG tablet Take 0.5 tablets (3.125 mg total) by mouth 2 (two) times daily with a meal.  . CRESTOR 20 MG tablet TAKE 1 TABLET(20 MG) BY MOUTH AT BEDTIME  . Cyanocobalamin (VITAMIN B-12 PO) Take 1 tablet by mouth daily.  . escitalopram (LEXAPRO) 10 MG tablet Take 1 tablet (10 mg total) by mouth daily. (Patient taking differently: Take 10 mg by mouth at bedtime. )  . gabapentin (NEURONTIN) 400 MG capsule  Take 1 capsule (400 mg total) by mouth 3 (three) times daily.  Marland Kitchen HYDROcodone-acetaminophen (NORCO) 5-325 MG tablet Take 1-2 tablets by mouth every 6 (six) hours as needed.  . Insulin Degludec (TRESIBA FLEXTOUCH) 200 UNIT/ML SOPN Inject 3 Units into the skin every evening.  . meloxicam (MOBIC) 15 MG tablet Take 1 tablet (15 mg total) by mouth daily.  . nitroGLYCERIN  (NITROSTAT) 0.4 MG SL tablet Place 0.4 mg under the tongue every 5 (five) minutes as needed for chest pain. Reported on 04/17/2015  . rOPINIRole (REQUIP) 0.5 MG tablet TAKE 1 TABLET(0.5 MG) BY MOUTH AT BEDTIME  . sodium bicarbonate 650 MG tablet Take 1 tablet by mouth 2 (two) times daily. Reported on 04/17/2015  . traZODone (DESYREL) 50 MG tablet TAKE 1/2 TO 1 AND 1/2 TABLETS(25 TO 75 MG) BY MOUTH AT BEDTIME AS NEEDED FOR SLEEP  . Vitamin D, Ergocalciferol, (DRISDOL) 50000 units CAPS capsule TK ONE C PO  Q WEEK    FAMILY HISTORY:  His indicated that his mother is deceased. He indicated that his father is alive. He indicated that only one of his two brothers is alive.   SOCIAL HISTORY: He  reports that he has never smoked. He has never used smokeless tobacco. He reports that he does not drink alcohol or use illicit drugs.  REVIEW OF SYSTEMS:     SUBJECTIVE:    VITAL SIGNS: BP 107/81 mmHg  Pulse 70  Resp 19  SpO2 97%  HEMODYNAMICS:    VENTILATOR SETTINGS: Vent Mode:  [-] PRVC FiO2 (%):  [100 %] 100 % Set Rate:  [16 bmp] 16 bmp Vt Set:  [520 mL] 520 mL PEEP:  [5 cmH20] 5 cmH20 Plateau Pressure:  [16 cmH20] 16 cmH20  INTAKE / OUTPUT:    PHYSICAL EXAMINATION: General:  Obese male on vent Neuro:  Comatose, Pupils equal, reactive, sluggish HEENT:  [place]/AT, no obvious JVD Cardiovascular:  RRR, no MRG Lungs:  Clear bilateral breath sounds Abdomen:  Soft, non-distended Musculoskeletal:  No acute deformity Skin:  Grossly intact  LABS:  BMET  Recent Labs Lab 23-Apr-2015 1801  NA 141  K 4.6  CL 106  BUN 44*  CREATININE 2.70*  GLUCOSE 259*    Electrolytes No results for input(s): CALCIUM, MG, PHOS in the last 168 hours.  CBC  Recent Labs Lab 04-23-2015 1748 Apr 23, 2015 1801  WBC 10.7*  --   HGB 10.2* 11.6*  HCT 33.4* 34.0*  PLT 198  --     Coag's No results for input(s): APTT, INR in the last 168 hours.  Sepsis Markers  Recent Labs Lab 04/23/15 1801   LATICACIDVEN 6.07*    ABG No results for input(s): PHART, PCO2ART, PO2ART in the last 168 hours.  Liver Enzymes No results for input(s): AST, ALT, ALKPHOS, BILITOT, ALBUMIN in the last 168 hours.  Cardiac Enzymes No results for input(s): TROPONINI, PROBNP in the last 168 hours.  Glucose  Recent Labs Lab 04/17/15 0613 04/17/15 1406  GLUCAP 94 81    Imaging Dg Chest Portable 1 View  April 23, 2015  CLINICAL DATA:  Cardiac arrest, ETT, NGT EXAM: PORTABLE CHEST 1 VIEW COMPARISON:  02/10/2015 FINDINGS: Endotracheal tube is in place, 4.2 cm above carina. Nasogastric tube has been placed, tip beyond the image beyond the lower esophagus. There is elevation of the right hemidiaphragm, more prominent than seen on prior images. Heart is enlarged. There is airspace filling opacity bilaterally but more prominent in the left upper lobe. IMPRESSION: 1. Interval intubation and  placement of nasogastric tube. 2. Cardiomegaly and probable edema. 3. Question of infiltrate in the left upper lobe. Electronically Signed   By: Norva Pavlov M.D.   On: 05/05/2015 18:33     STUDIES:  TEE 3/9 >>> Clot in RMPA.Consider scanning head and lytics. However no cor pulmonale on TEE. CT head 3/9 >>> Negative CT of the brain. Chronic sinusitis  CULTURES: BCx2 3/9 > Urine 3/9 >  ANTIBIOTICS: None  SIGNIFICANT EVENTS: 3/8 carpal tunnel surgery 3/9 Cardiac arrest, admit  LINES/TUBES: OETT 3/9 >>>  DISCUSSION: 60 year old male with PMH as above suffered cardiac arrest 3/9 with up to 45 mins estimated PEA downtime. Hypotensive requiring epi drip. TEE done by cards found probable PE. Will admit ICU, considering tpa, hypothermia.   ASSESSMENT / PLAN:  PULMONARY A: Acute hypoxemic respiratory failure secondary to cardiac arrest Concern PE OSA  P:   Full vent support ABG Vent bundle Will discuss thrombolysis with attending MD  CARDIOVASCULAR A:  Cardiac arrest PEA  CAD - known LAD occlusion  with collaterals Chronic CHF  Elevated lactic  P:  Telemetry monitoring MAP goal 32mm/Hg Epinephrine infusion for MAP goal Trend troponin Trend lactic acid Cardiology following Will discuss therapeutic hypothermia with attending physician   RENAL A:   Acute on CKD NAG acidosis  P:   Follow BMP Check ABG consider bicarb infusion  GASTROINTESTINAL A:   No acute issues  P:   NPO PPI  HEMATOLOGIC A:   Concern PE  P:  May be worth risk of getting CTA Heparin gtt Consider thrombolysis   INFECTIOUS A:   No acute issues  P:   Monitor off ABX  ENDOCRINE A:   DM  P:   CBG monitoring and SSI q 4 hours  NEUROLOGIC A:   Acute anoxic/metabolic encephalopathy in setting cardiac arrest P:   RASS goal: 0 CT head pending EEG   FAMILY  - Updates: Family updated by [facility] in ED 3/9  - Inter-disciplinary family meet or Palliative Care meeting due by:  3/16   Joneen Roach, AGACNP-BC [place] [facility] Pager [phone] or [phone]  04/11/2015 7:53 PM

## 2015-04-18 NOTE — Progress Notes (Signed)
Pt transported to and from CT without incident.  Pt suctioned prior to and after transport.

## 2015-04-18 NOTE — ED Notes (Signed)
Per Critical Care  Need to wait to do CT until more IV access

## 2015-04-18 NOTE — Procedures (Signed)
Arterial Catheter Insertion Procedure Note Angel Cruz [id number] 15-Feb-1955  Procedure: Insertion of Arterial Catheter  Indications: Blood pressure monitoring and Frequent blood sampling  Procedure Details Consent: Unable to obtain consent because of emergent medical necessity. Time Out: Verified patient identification, verified procedure, site/side was marked, verified correct patient position, special equipment/implants available, medications/allergies/relevent history reviewed, required imaging and test results available.  Performed  Maximum sterile technique was used including antiseptics, cap, gloves, gown, hand hygiene, mask and sheet. Skin prep: Chlorhexidine; local anesthetic administered 20 gauge catheter was inserted into right radial artery using the Seldinger technique.  Evaluation Blood flow good; BP tracing good. Complications: No apparent complications. RT placed arrow catheter on second attempt.   Angel Cruz 05-04-2015

## 2015-04-18 NOTE — Procedures (Signed)
Central Venous Catheter Insertion Procedure Note Angel Cruz [id number] 12-07-1955  Procedure: Insertion of Central Venous Catheter Indications: Assessment of intravascular volume, Drug and/or fluid administration and Frequent blood sampling  Procedure Details Consent: Risks of procedure as well as the alternatives and risks of each were explained to the (patient/caregiver).  Consent for procedure obtained. Time Out: Verified patient identification, verified procedure, site/side was marked, verified correct patient position, special equipment/implants available, medications/allergies/relevent history reviewed, required imaging and test results available.  Performed  Maximum sterile technique was used including antiseptics, cap, gloves, gown, hand hygiene, mask and sheet. Skin prep: Chlorhexidine; local anesthetic administered A antimicrobial bonded/coated triple lumen catheter was placed in the right internal jugular vein using the Seldinger technique.  Evaluation Blood flow good Complications: No apparent complications Patient did tolerate procedure well. Chest X-ray ordered to verify placement.  CXR: pending.  Procedure performed under direct ultrasound guidance for real time vessel cannulation.      Rutherford Guys, [place] - [facility] [place] [facility] Pager: [phone]  or [phone] 04/12/2015, 9:49 PM

## 2015-04-18 NOTE — Addendum Note (Signed)
Addendum  created 2015/05/05 0830 by Stormy Fabian, CRNA   Modules edited: Anesthesia Events

## 2015-04-18 NOTE — Consult Note (Signed)
CARDIOLOGY CONSULT NOTE       Patient ID: Angel Cruz MRN: [id number] DOB/AGE: 58-Jun-1957 58 y.o.  Admit date: 04/21/2015 Referring Physician:  Rancour Primary Physician: Ruel Favors, MD Primary Cardiologist:  Kirke Corin Reason for Consultation:  Cardiac Arrest   Active Problems:   * No active hospital problems. *   HPI:  60 y.o. with CAD.  Cath 2015 with chronic LAD occlusion and no disease in RCA/circumflex.  EF normal on echo August 2016.  Just had carpal tunnel surgery yesterday with Dr Dimas Aguas.  Had sudden death at home while in yard.  Wife/EMS do not indicate any preceding chest pain or dyspnea. Rhythm EMD no shockable rhythm.  CPR prolonged in field 45 minutes.  Pulse / off and on.  Multiple rounds of Epi and King tube in place.  In ER  SR/ST.  ECG no acute ST elevation repeated x 2  RBBB.  Troponin negative.  TTE poor quality due to morbid obesity.  King tube removed and ETT placed by ER doctor with better ventilation.    I performed TEE at bedside in ER  Moderate LVE EF 35-40% diffuse hypokinesis Mild MR RV normal  No effusion  Normal aorta ? Large thrombus in right MPA  CCM to admit and cooling begun.  ER to decide on lytics after scanning head ? CTA but patient has CRF with baseline Cr around 2 and currently 2.7   ROS All other systems reviewed and negative except as noted above  Past Medical History  Diagnosis Date  . Diabetes mellitus without complication (HCC)   . Hypertension   . Hyperlipidemia   . Depression   . Rotator cuff tear   . CKD (chronic kidney disease), stage III   . Ischemic cardiomyopathy     a. 10/2013 Echo: EF 35-40%, severe distal anteroseptal, anterior, and apical HK. Diast dysfxn, mild conc LVH, mildly dil LA, mild Ao sclerosis w/o stenosis.  Marland Kitchen CAD (coronary artery disease)     a. 10/2013 Lexi MV: EF 31%, apical, septal, ant-apical, inf-apical, lat-apical scar, septal and apical peri-infarct ischemia;  b. 10/2013 Cath: LM nl, LAD 20ost, 136m  (faint R->L collats), D1 80p, LCX min irregs, OM1 min irregs, OM2 nl, OM3 30p, RCA 20p, PDA 60, RPL min irregs-->Med Rx.  . Morbid obesity (HCC)   . CHF (congestive heart failure) (HCC)   . OSA (obstructive sleep apnea)   . Sleep apnea     recent test confirmed diagnosis, was performed at the sleep center across the street  . Hypotension   . CHF (congestive heart failure) (HCC)   . Myocardial infarction [facility])     Family History  Problem Relation Age of Onset  . Stroke Mother   . COPD Mother   . Heart failure Mother   . Heart attack Mother   . Colon cancer Father   . Diabetes Father   . Diabetes Brother   . Cancer Brother     Social History   Social History  . Marital Status: Married    Spouse Name: Angelique Blonder  . Number of Children: 2  . Years of Education: N/A   Occupational History  . Not on file.   Social History Main Topics  . Smoking status: Never Smoker   . Smokeless tobacco: Never Used  . Alcohol Use: No  . Drug Use: No  . Sexual Activity: No   Other Topics Concern  . Not on file   Social History Narrative    Past Surgical  History  Procedure Laterality Date  . Cardiac catheterization  10/19/2013    [facility]  . Uvulectomy  1995  . Atrial fibrillation ablation  1995  . Elbow surgery Left   . Rotator cuff repair    . Carpal tunnel release    . Carpal tunnel release Left 04/17/2015    Procedure: CARPAL TUNNEL RELEASE;  Surgeon: Deeann Saint, MD;  Location: [facility] ORS;  Service: Orthopedics;  Laterality: Left;  . Tennis elbow release/nirschel procedure Left 04/17/2015    Procedure: TENNIS ELBOW RELEASE/NIRSCHEL PROCEDURE;  Surgeon: Deeann Saint, MD;  Location: [facility] ORS;  Service: Orthopedics;  Laterality: Left;       . epinephrine      Physical Exam: Blood pressure 107/81, pulse 70, resp. rate 19, SpO2 97 %.    Unconscious obese white male  NG tube ETT  HEENT: Pupils fixed and dilated  Neck supple with no adenopathy JVP normal no bruits no thyromegaly Lungs  clear with no wheezing and good diaphragmatic motion Heart:  S1/S2 no murmur, no rub, gallop or click PMI normal Abdomen: benighn, BS positve, no tenderness, no AAA no bruit.  No HSM or HJR Distal pulses faint  No edema LUE in cast from recent surgery    Labs:   Lab Results  Component Value Date   WBC 5.2 04/10/2015   HGB 11.6* May 10, 2015   HCT 34.0* 2015-05-10   MCV 83.1 04/10/2015   PLT 154 04/10/2015    Recent Labs Lab 05-10-2015 1801  NA 141  K 4.6  CL 106  BUN 44*  CREATININE 2.70*  GLUCOSE 259*   Lab Results  Component Value Date   TROPONINI <0.03 02/11/2015    Lab Results  Component Value Date   CHOL 93 09/09/2014   CHOL 153 07/31/2014   CHOL 198 10/19/2013   Lab Results  Component Value Date   HDL 23* 09/09/2014   HDL 24* 07/31/2014   HDL 21* 10/19/2013   Lab Results  Component Value Date   LDLCALC 26 09/09/2014   LDLCALC 60 07/31/2014   LDLCALC SEE COMMENT 10/19/2013   Lab Results  Component Value Date   TRIG 220* 09/09/2014   TRIG 346* 07/31/2014   TRIG 534* 10/19/2013   Lab Results  Component Value Date   CHOLHDL 4.0 09/09/2014   CHOLHDL 6.4 07/31/2014   No results found for: LDLDIRECT    Radiology: No results found.  EKG:   SR/ST RBBB no acute ST elevation    ASSESSMENT AND PLAN:  Cardiac Arrest:  Clinically most consistent with PE.  Obese , recent surgery with left arm in cast.  TEE shows clot in RMPA.  Discussed with ER doctor Consider scanning head and lytics.  However no cor pulmonale on TEE.  Also discussed need to afirm diagnosis and despite Cr with consider CTA  CAD:  He may have had an arrhythmic event but in filed had EMD.  I think EF down due to stunned ventricle with prolonged down time in setting of known Collaterals to LAD.  Troponin negative and no ST elevation on ECG.  Can consider amiodarone if developes NSVT but none while I was in ER  Neuro:  Pupils fixed and dilated per ER doctory  Cooling per CCM    Signed: Charlton Haws 05/10/2015, 6:25 PM

## 2015-04-18 NOTE — ED Notes (Signed)
Arrives from [facility], coded in field, received 5 epis in route with ROSC and loss of pulses pta, CPE ongoing on arrival

## 2015-04-18 NOTE — Progress Notes (Signed)
Echocardiogram Echocardiogram Transesophageal has been performed.  Angel Cruz 04/24/2015, 6:41 PM

## 2015-04-19 ENCOUNTER — Inpatient Hospital Stay (HOSPITAL_COMMUNITY): Payer: Medicaid Other

## 2015-04-19 ENCOUNTER — Encounter (HOSPITAL_COMMUNITY): Payer: Self-pay | Admitting: *Deleted

## 2015-04-19 DIAGNOSIS — J9600 Acute respiratory failure, unspecified whether with hypoxia or hypercapnia: Secondary | ICD-10-CM

## 2015-04-19 DIAGNOSIS — I469 Cardiac arrest, cause unspecified: Secondary | ICD-10-CM

## 2015-04-19 LAB — BASIC METABOLIC PANEL
ANION GAP: 12 (ref 5–15)
ANION GAP: 11 (ref 5–15)
Anion gap: 14 (ref 5–15)
BUN: 39 mg/dL — ABNORMAL HIGH (ref 6–20)
BUN: 40 mg/dL — ABNORMAL HIGH (ref 6–20)
BUN: 45 mg/dL — AB (ref 6–20)
CHLORIDE: 107 mmol/L (ref 101–111)
CO2: 23 mmol/L (ref 22–32)
CO2: 20 mmol/L — AB (ref 22–32)
CO2: 20 mmol/L — AB (ref 22–32)
CREATININE: 4.01 mg/dL — AB (ref 0.61–1.24)
Calcium: 8.2 mg/dL — ABNORMAL LOW (ref 8.9–10.3)
Calcium: 7.8 mg/dL — ABNORMAL LOW (ref 8.9–10.3)
Calcium: 7.8 mg/dL — ABNORMAL LOW (ref 8.9–10.3)
Chloride: 110 mmol/L (ref 101–111)
Chloride: 111 mmol/L (ref 101–111)
Creatinine, Ser: 3.42 mg/dL — ABNORMAL HIGH (ref 0.61–1.24)
Creatinine, Ser: 3.74 mg/dL — ABNORMAL HIGH (ref 0.61–1.24)
GFR calc Af Amer: 21 mL/min — ABNORMAL LOW (ref >60)
GFR calc Af Amer: 19 mL/min — ABNORMAL LOW (ref >60)
GFR calc Af Amer: 18 mL/min — ABNORMAL LOW (ref >60)
GFR calc non Af Amer: 17 mL/min — ABNORMAL LOW (ref >60)
GFR calc non Af Amer: 15 mL/min — ABNORMAL LOW (ref >60)
GFR, EST NON AFRICAN AMERICAN: 18 mL/min — AB (ref >60)
GLUCOSE: 93 mg/dL (ref 65–99)
GLUCOSE: 90 mg/dL (ref 65–99)
Glucose, Bld: 70 mg/dL (ref 65–99)
POTASSIUM: 5.7 mmol/L — AB (ref 3.5–5.1)
POTASSIUM: 5.9 mmol/L — AB (ref 3.5–5.1)
Potassium: 5.6 mmol/L — ABNORMAL HIGH (ref 3.5–5.1)
Sodium: 145 mmol/L (ref 135–145)
Sodium: 142 mmol/L (ref 135–145)
Sodium: 141 mmol/L (ref 135–145)

## 2015-04-19 LAB — POCT I-STAT, CHEM 8
BUN: 37 mg/dL — ABNORMAL HIGH (ref 6–20)
CREATININE: 3.1 mg/dL — AB (ref 0.61–1.24)
Calcium, Ion: 1.14 mmol/L (ref 1.12–1.23)
Chloride: 106 mmol/L (ref 101–111)
Glucose, Bld: 236 mg/dL — ABNORMAL HIGH (ref 65–99)
HEMATOCRIT: 30 % — AB (ref 39.0–52.0)
HEMOGLOBIN: 10.2 g/dL — AB (ref 13.0–17.0)
Potassium: 4.2 mmol/L (ref 3.5–5.1)
SODIUM: 141 mmol/L (ref 135–145)
TCO2: 21 mmol/L (ref 0–100)

## 2015-04-19 LAB — GLUCOSE, CAPILLARY
GLUCOSE-CAPILLARY: 241 mg/dL — AB (ref 65–99)
GLUCOSE-CAPILLARY: 48 mg/dL — AB (ref 65–99)
GLUCOSE-CAPILLARY: 94 mg/dL (ref 65–99)
Glucose-Capillary: 104 mg/dL — ABNORMAL HIGH (ref 65–99)
Glucose-Capillary: 92 mg/dL (ref 65–99)
Glucose-Capillary: 47 mg/dL — ABNORMAL LOW (ref 65–99)
Glucose-Capillary: 108 mg/dL — ABNORMAL HIGH (ref 65–99)
Glucose-Capillary: 127 mg/dL — ABNORMAL HIGH (ref 65–99)
Glucose-Capillary: 62 mg/dL — ABNORMAL LOW (ref 65–99)
Glucose-Capillary: 112 mg/dL — ABNORMAL HIGH (ref 65–99)

## 2015-04-19 LAB — ECHOCARDIOGRAM COMPLETE
HEIGHTINCHES: 69 in
WEIGHTICAEL: 4310.43 [oz_av]

## 2015-04-19 LAB — CBC
HCT: 32.2 % — ABNORMAL LOW (ref 39.0–52.0)
Hemoglobin: 10.1 g/dL — ABNORMAL LOW (ref 13.0–17.0)
MCH: 27.2 pg (ref 26.0–34.0)
MCHC: 31.4 g/dL (ref 30.0–36.0)
MCV: 86.8 fL (ref 78.0–100.0)
PLATELETS: 200 10*3/uL (ref 150–400)
RBC: 3.71 MIL/uL — ABNORMAL LOW (ref 4.22–5.81)
RDW: 14.8 % (ref 11.5–15.5)
WBC: 9.8 10*3/uL (ref 4.0–10.5)

## 2015-04-19 LAB — POCT I-STAT 3, ART BLOOD GAS (G3+)
ACID-BASE DEFICIT: 8 mmol/L — AB (ref 0.0–2.0)
Bicarbonate: 20.2 mEq/L (ref 20.0–24.0)
O2 Saturation: 93 %
PCO2 ART: 49.2 mmHg — AB (ref 35.0–45.0)
PO2 ART: 80 mmHg (ref 80.0–100.0)
TCO2: 22 mmol/L (ref 0–100)
pH, Arterial: 7.222 — ABNORMAL LOW (ref 7.350–7.450)

## 2015-04-19 LAB — TROPONIN I
Troponin I: 0.52 ng/mL (ref <0.031)
Troponin I: 0.38 ng/mL — ABNORMAL HIGH (ref <0.031)
Troponin I: 0.26 ng/mL — ABNORMAL HIGH (ref <0.031)

## 2015-04-19 LAB — HEPARIN LEVEL (UNFRACTIONATED)
Heparin Unfractionated: 0.5 IU/mL (ref 0.30–0.70)
Heparin Unfractionated: 1.09 IU/mL — ABNORMAL HIGH (ref 0.30–0.70)

## 2015-04-19 LAB — PHOSPHORUS: Phosphorus: 6.9 mg/dL — ABNORMAL HIGH (ref 2.5–4.6)

## 2015-04-19 LAB — PROTIME-INR
INR: 1.3 (ref 0.00–1.49)
INR: 1.24 (ref 0.00–1.49)
PROTHROMBIN TIME: 16.3 s — AB (ref 11.6–15.2)
Prothrombin Time: 15.8 seconds — ABNORMAL HIGH (ref 11.6–15.2)

## 2015-04-19 LAB — MAGNESIUM: Magnesium: 2 mg/dL (ref 1.7–2.4)

## 2015-04-19 LAB — MRSA PCR SCREENING: MRSA BY PCR: NEGATIVE

## 2015-04-19 LAB — D-DIMER, QUANTITATIVE (NOT AT ARMC): D DIMER QUANT: 9.67 ug{FEU}/mL — AB (ref 0.00–0.50)

## 2015-04-19 LAB — LACTIC ACID, PLASMA: LACTIC ACID, VENOUS: 1.3 mmol/L (ref 0.5–2.0)

## 2015-04-19 MED ORDER — PERFLUTREN LIPID MICROSPHERE
1.0000 mL | INTRAVENOUS | Status: AC | PRN
  Filled 2015-04-19: qty 10

## 2015-04-19 MED ORDER — ANTISEPTIC ORAL RINSE SOLUTION (CORINZ)
7.0000 mL | OROMUCOSAL | Status: DC
Start: 2015-04-19 — End: 2015-04-20
  Administered 2015-04-19 – 2015-04-20 (×13): 7 mL via OROMUCOSAL

## 2015-04-19 MED ORDER — STERILE WATER FOR INJECTION IV SOLN
INTRAVENOUS | Status: DC
  Administered 2015-04-19: 11:00:00 via INTRAVENOUS
  Filled 2015-04-19 (×3): qty 850

## 2015-04-19 MED ORDER — FENTANYL BOLUS VIA INFUSION
50.0000 ug | INTRAVENOUS | Status: DC | PRN
  Filled 2015-04-19: qty 50

## 2015-04-19 MED ORDER — DEXTROSE 50 % IV SOLN
50.0000 mL | Freq: Once | INTRAVENOUS | Status: AC
  Administered 2015-04-19: 50 mL via INTRAVENOUS

## 2015-04-19 MED ORDER — SODIUM BICARBONATE 8.4 % IV SOLN
INTRAVENOUS | Status: DC
  Administered 2015-04-19: 19:00:00 via INTRAVENOUS
  Filled 2015-04-19 (×5): qty 100

## 2015-04-19 MED ORDER — SODIUM CHLORIDE 0.9 % IV SOLN
25.0000 ug/h | INTRAVENOUS | Status: DC
  Administered 2015-04-19: 300 ug/h via INTRAVENOUS
  Administered 2015-04-19: 200 ug/h via INTRAVENOUS
  Administered 2015-04-19: 300 ug/h via INTRAVENOUS
  Filled 2015-04-19 (×3): qty 50

## 2015-04-19 MED ORDER — INSULIN ASPART 100 UNIT/ML ~~LOC~~ SOLN: 10.0000 [IU] | Freq: Once | SUBCUTANEOUS | Status: DC

## 2015-04-19 MED ORDER — DEXTROSE 50 % IV SOLN
INTRAVENOUS | Status: AC
  Filled 2015-04-19: qty 50

## 2015-04-19 MED ORDER — SODIUM POLYSTYRENE SULFONATE 15 GM/60ML PO SUSP
30.0000 g | Freq: Once | ORAL | Status: AC
  Administered 2015-04-19: 30 g
  Filled 2015-04-19: qty 120

## 2015-04-19 MED ORDER — MIDAZOLAM HCL 2 MG/2ML IJ SOLN
2.0000 mg | Freq: Once | INTRAMUSCULAR | Status: AC
  Administered 2015-04-19: 2 mg via INTRAVENOUS

## 2015-04-19 MED ORDER — FENTANYL CITRATE (PF) 100 MCG/2ML IJ SOLN: 50.0000 ug | Freq: Once | INTRAMUSCULAR | Status: AC

## 2015-04-19 MED ORDER — INSULIN ASPART 100 UNIT/ML IV SOLN
10.0000 [IU] | Freq: Once | INTRAVENOUS | Status: AC
  Administered 2015-04-19: 10 [IU] via INTRAVENOUS

## 2015-04-19 MED ORDER — CHLORHEXIDINE GLUCONATE 0.12% ORAL RINSE (MEDLINE KIT)
15.0000 mL | Freq: Two times a day (BID) | OROMUCOSAL | Status: DC
  Administered 2015-04-19 – 2015-04-20 (×3): 15 mL via OROMUCOSAL

## 2015-04-19 MED ORDER — PERFLUTREN LIPID MICROSPHERE
INTRAVENOUS | Status: AC
  Administered 2015-04-19: 2 mL
  Filled 2015-04-19: qty 10

## 2015-04-19 MED ORDER — DEXTROSE 50 % IV SOLN
1.0000 | Freq: Once | INTRAVENOUS | Status: AC
Start: 2015-04-19 — End: 2015-04-19
  Administered 2015-04-19: 50 mL via INTRAVENOUS
  Filled 2015-04-19: qty 50

## 2015-04-19 NOTE — Progress Notes (Signed)
Patient ID: Angel Cruz, male   DOB: 05-29-1955, 60 y.o.   MRN: [id number]    Subjective:  Intubated , sedated cooling   Objective:  Filed Vitals:   04/19/15 0600 04/19/15 0700 04/19/15 0758 04/19/15 0800  BP:   116/56   Pulse: 84 68 72 71  Temp: 97.3 F (36.3 C) 97.5 F (36.4 C)  97.3 F (36.3 C)  TempSrc: Core (Comment) Core (Comment)  Core (Comment)  Resp: 16 5 28 28   Height:      Weight:      SpO2: 92% 99% 96% 96%    Intake/Output from previous day:  Intake/Output Summary (Last 24 hours) at 04/19/15 0844 Last data filed at 04/19/15 0700  Gross per 24 hour  Intake 1344.2 ml  Output     75 ml  Net 1269.2 ml    Physical Exam: Sedated  Obese white male  HEENT: normal Neck supple with no adenopathy JVP unable to see no bruits no thyromegaly Lungs clear with no wheezing and good diaphragmatic motion Heart:  S1/S2 no murmur, no rub, gallop or click PMI normal Abdomen: benighn, BS positve, no tenderness, no AAA no bruit.  No HSM or HJR Distal pulses intact with no bruits No edema Neuro non-focal Skin warm and dry No muscular weakness   Lab Results: Basic Metabolic Panel:  Recent Labs  61/60/73 2207 04/19/15 0530  NA 141 145  K 4.3 5.7*  CL 109 110  CO2 21* 23  GLUCOSE 229* 93  BUN 36* 39*  CREATININE 3.05* 3.42*  CALCIUM 8.2* 8.2*  MG  --  2.0  PHOS  --  6.9*   Liver Function Tests:  Recent Labs  05-02-2015 1748  AST 39  ALT 19  ALKPHOS 80  BILITOT 0.3  PROT 5.4*  ALBUMIN 2.9*   CBC:  Recent Labs  02-May-2015 1748 05/02/15 1801 04/19/15 0530  WBC 10.7*  --  9.8  NEUTROABS 4.9  --   --   HGB 10.2* 11.6* 10.1*  HCT 33.4* 34.0* 32.2*  MCV 89.8  --  86.8  PLT 198  --  200   Cardiac Enzymes:  Recent Labs  2015-05-02 1748 2015-05-02 2207 04/19/15 0242  TROPONINI <0.03 0.42* 0.52*   BNP: Invalid input(s): POCBNP D-Dimer:  Recent Labs  04/19/15 0242  DDIMER 9.67*    Imaging: Ct Head Wo Contrast  05-02-2015  CLINICAL DATA:   Cardiac arrest EXAM: CT HEAD WITHOUT CONTRAST TECHNIQUE: Contiguous axial images were obtained from the base of the skull through the vertex without intravenous contrast. COMPARISON:  10/30/2014 FINDINGS: Ventricle size normal. Negative for acute infarct. No edema or mass. No intracranial hemorrhage Calvarium intact. Mucosal edema throughout the paranasal sinuses without air-fluid level. IMPRESSION: Negative CT of the brain. Chronic sinusitis Electronically Signed   By: Marlan Palau M.D.   On: May 02, 2015 19:20   Ct Angio Chest Pe W/cm &/or Wo Cm  2015-05-02  CLINICAL DATA:  Status post cardiac arrest. Transesophageal echo with question pulmonary artery thrombus. Patient with diminished renal function, however patient's family wishes to proceed. EXAM: CT ANGIOGRAPHY CHEST WITH CONTRAST TECHNIQUE: Multidetector CT imaging of the chest was performed using the standard protocol during bolus administration of intravenous contrast. Multiplanar CT image reconstructions and MIPs were obtained to evaluate the vascular anatomy. CONTRAST:  OMNIPAQUE IOHEXOL 350 MG/ML SOLN COMPARISON:  Chest radiographs earlier this day. FINDINGS: No filling defects in the pulmonary arteries to the segmental level, the subsegmental levels cannot be assessed secondary to  contrast bolus timing, body habitus, and significant lower lobe atelectasis. Multi chamber cardiomegaly. Normal caliber thoracic aorta without dissection. Coronary artery calcifications are seen. No pericardial effusion. Prominent mediastinal and bilateral hilar lymph nodes are likely reactive. Endotracheal tube, enteric tube, and right central line in place. Moderate bilateral pleural effusions. There is adjacent compressive atelectasis, involving near complete right lower lobe, large portion of left lower lobe, and to a lesser extent both upper lobes. Atelectasis in the right middle lobe also seen. No definite findings of pulmonary edema. Trachea and bronchi are  patent. Minimal upper abdomen included.  The stomach is distended. Nondisplaced fractures of multiple bilateral anterior ribs, likely CPR related. No pneumothorax. Review of the MIP images confirms the above findings. IMPRESSION: 1. No pulmonary embolus to the segmental level. 2. Moderate bilateral pleural effusions. There is adjacent compressive atelectasis throughout the lungs. Multi chamber cardiomegaly. 3. Nondisplaced bilateral anterior rib fractures, likely CPR related. Electronically Signed   By: Rubye Oaks M.D.   On: 05-06-2015 23:27   Dg Chest Port 1 View  06-May-2015  CLINICAL DATA:  Central line placement. EXAM: PORTABLE CHEST 1 VIEW COMPARISON:  May 06, 2015 FINDINGS: Endotracheal tube with tip measuring 6 cm above the carina. Enteric tube tip is off the field of view but below the left hemidiaphragm. A right central venous catheter has been placed with tip over the low SVC region. No pneumothorax. Shallow inspiration with atelectasis in the lung bases. Cardiac enlargement. Perihilar infiltration suggesting mild edema. IMPRESSION: Appliances appear in satisfactory location. Shallow inspiration with atelectasis in the lung bases. Cardiac enlargement with mild perihilar edema. Electronically Signed   By: Burman Nieves M.D.   On: May 06, 2015 22:22   Dg Chest Portable 1 View  06-May-2015  CLINICAL DATA:  Cardiac arrest, ETT, NGT EXAM: PORTABLE CHEST 1 VIEW COMPARISON:  02/10/2015 FINDINGS: Endotracheal tube is in place, 4.2 cm above carina. Nasogastric tube has been placed, tip beyond the image beyond the lower esophagus. There is elevation of the right hemidiaphragm, more prominent than seen on prior images. Heart is enlarged. There is airspace filling opacity bilaterally but more prominent in the left upper lobe. IMPRESSION: 1. Interval intubation and placement of nasogastric tube. 2. Cardiomegaly and probable edema. 3. Question of infiltrate in the left upper lobe. Electronically Signed   By:  Norva Pavlov M.D.   On: 2015/05/06 18:33    Cardiac Studies:  ECG:  SR RBBB    Telemetry:  NSR  Anterolateral T wave inversions new from yesterday  Echo:  TEE  EF 35% diffuse hypokinesis mild MR RV normal no effusion   Medications:   . sodium chloride  2,000 mL Intravenous Once  . antiseptic oral rinse  7 mL Mouth Rinse 10 times per day  . budesonide (PULMICORT) nebulizer solution  0.5 mg Nebulization BID  . chlorhexidine gluconate  15 mL Mouth Rinse BID  . ipratropium-albuterol  3 mL Nebulization QID  . pantoprazole (PROTONIX) IV  40 mg Intravenous QHS     . sodium chloride    . epinephrine    . fentaNYL infusion INTRAVENOUS 250 mcg/hr (04/19/15 0401)  . heparin 1,600 Units/hr (06-May-2015 2136)  . norepinephrine (LEVOPHED) Adult infusion 5 mcg/min (04/19/15 0024)    Assessment/Plan:  Cardiac Arrest:  Etiology not clear. Sudden onset with no chest pain or dyspnea.  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  TEE also suggested clot in RMPA and d dimer elevated with carpal tunnel surgery and LUE in cast but V/Q was negative.  Contnue cooling Hemodynamics and rhythm stable will need right and left cath and w/u for AICD if neuro function recovers.  Only family member here Aunt who I spoke with   Charlton Haws 04/19/2015, 8:44 AM

## 2015-04-19 NOTE — Procedures (Signed)
ELECTROENCEPHALOGRAM REPORT  Date of Study: 04/19/2015  Patient's Name: Angel Cruz MRN: [id number] Date of Birth: 1956/01/06  Referring Provider: Rutherford Guys, PA-C  Clinical History: This is a 60 year old man with cardiac arrest, per records total time from arrest to final ROSC 45 mins.  Medications: fentaNYL (SUBLIMAZE) 2,500 mcg in sodium chloride 0.9 % 250 mL (10 mcg/mL) infusion budesonide (PULMICORT) nebulizer solution 0.5 mg EPINEPHrine (ADRENALIN) 4 mg in dextrose 5 % 250 mL (0.016 mg/mL) infusion ipratropium-albuterol (DUONEB) 0.5-2.5 (3) MG/3ML nebulizer solution 3 mL norepinephrine (LEVOPHED) 4 mg in dextrose 5 % 250 mL (0.016 mg/mL) infusion pantoprazole (PROTONIX) injection 40 mg  Technical Summary: A multichannel digital EEG recording measured by the international 10-20 system with electrodes applied with paste and impedances below 5000 ohms performed in our laboratory with EKG monitoring in an intubated and sedated patient.  Hyperventilation and photic stimulation were not performed.  The digital EEG was referentially recorded, reformatted, and digitally filtered in a variety of bipolar and referential montages for optimal display.    Description: The patient is intubated and sedated on Fentanyl during the recording. There is loss of normal background activity. There is a burst-suppression pattern with diffuse suppression of the background with intermittent bilateral periodic sharp waves maximal over the frontal regions and bursts of 4-5 Hz theta activity occurring every 1-10 seconds without evolution in frequency or amplitude. There is no spontaneous reactivity seen. Noxious stimulation was not perfomed. There is muscle artifact over the frontal leads. Hyperventilation and photic stimulation were not performed. There were no electrographic seizures seen.   EKG lead was unremarkable.  Impression: This EEG is markedly abnormal due to burst-suppression pattern with  intermittent bilateral periodic sharp waves.   Clinical Correlation: This record shows evidence of severe diffuse or bilateral cerebral dysfunction which more likely reflects the patient's history of anoxic brain injury as opposed to ictal epileptiform activity. In the absence of CNS active, sedating, or anesthetic medications, this suggests a poor prognosis. If further clinical questions remain, repeat EEG off sedation may be helpful. Clinical correlation is advised.    Patrcia Dolly, M.D.

## 2015-04-19 NOTE — Progress Notes (Signed)
Initial Nutrition Assessment  DOCUMENTATION CODES:   Obesity unspecified  INTERVENTION:    If unable to extubate patient within the next 24-48 hours, recommend initiate TF via OGT with Vital High Protein at 30 ml/h, increase by 10 ml every 4 hours to goal rate of 70 ml/h to provide 1680 kcals, 147 gm protein, 1404 ml free water daily.  NUTRITION DIAGNOSIS:   Inadequate oral intake related to inability to eat as evidenced by NPO status.  GOAL:   Provide needs based on ASPEN/SCCM guidelines  MONITOR:   Vent status, I & O's, Labs  REASON FOR ASSESSMENT:   Ventilator    ASSESSMENT:   60 year old male with PMH as below, which includes DM, HTN, HLD, CKD III, Ischemic cardiomyopathy, OSA, and recent carpal tunnel surgery on 3/8. 3/9 He was in yard with family talking to neighbors and suffered a syncopal episode. Family helped him into the house, where he insisted he was fine, but then promptly became unresponsive and lost his pulse per family. 911 was called and family started CPR within 2-3 mins of arrest.  Labs reviewed: potassium and phosphorus are elevated. Nutrition focused physical exam completed.  No muscle or subcutaneous fat depletion noticed. Patient is being kept at 36 degrees x 48 hours per RN. No plans for TF today, may consider feeding tomorrow.  Patient is currently intubated on ventilator support Temp (24hrs), Avg:96.7 F (35.9 C), Min:95.4 F (35.2 C), Max:97.9 F (36.6 C)  Propofol: none  Diet Order:  Diet NPO time specified  Skin:  Reviewed, no issues  Last BM:  3/9  Height:   Ht Readings from Last 1 Encounters:  04/19/15 5\' 9"  (1.753 m)    Weight:   Wt Readings from Last 1 Encounters:  04/19/15 269 lb 6.4 oz (122.2 kg)    Ideal Body Weight:  72.7 kg  BMI:  Body mass index is 39.77 kg/(m^2).  Estimated Nutritional Needs:   Kcal:  2952-8413  Protein:  >/= 145 gm  Fluid:  >/= 1.8 L  EDUCATION NEEDS:   No education needs identified at  this time  Joaquin Courts, RD, LDN, CNSC Pager [phone] After Hours Pager [phone]

## 2015-04-19 NOTE — Progress Notes (Signed)
ETT withdrawn to 24 cm at the lip per CCM order to withdraw by 3 cm based on CXR.

## 2015-04-19 NOTE — Progress Notes (Signed)
Utilization review completed. Kasandra Fehr, RN, BSN. 

## 2015-04-19 NOTE — Progress Notes (Signed)
EEG Completed; Results Pending  

## 2015-04-19 NOTE — Progress Notes (Signed)
  Echocardiogram 2D Echocardiogram with Definity has been performed.  Tye Savoy 04/19/2015, 2:37 PM

## 2015-04-19 NOTE — Progress Notes (Signed)
eLink Physician-Brief Progress Note Patient Name: Angel Cruz DOB: 1955-12-02 MRN: [id number]   Date of Service  04/19/2015  HPI/Events of Note  Camera check On patient. Resting comfortably in bed. FiO2 remains at 1.0.  Currently receiving bicarbonate IV infusion. After Labs continue to show metabolic acidosis along with persistent hyperkalemia at 5.6. Received Kayexalate via the tube earlier today without bowel movement. Bedside nurse reports soft abdomen and again confirms no bowel movement. Nurse also reports persistent  Hypoglycemia requiring repeat dextrose bolus infusion.  eICU Interventions  1. Repeat basic metabolic panel At 10 PM And possibly redoes Kayexalate or  Give Kayexalate enema at that time 2. Continue to monitor on telemetry 3. Switch IV fluid to D5 with 2 amp of bicarbonate and water at 100 mL per hour     Intervention Category Major Interventions: Electrolyte abnormality - evaluation and management;Acid-Base disturbance - evaluation and management  [name] 04/19/2015, 6:12 PM

## 2015-04-19 NOTE — Progress Notes (Signed)
   05/02/2015 1800  Clinical Encounter Type  Visited With Family;Patient not available;Health care provider  Visit Type Initial;Trauma;ED  Referral From Nurse  Spiritual Encounters  Spiritual Needs Prayer;Emotional;Grief support  CH spent significant time with family offering prayer, emotional and psychological support during pt time in the ED; [facility] will continue to monitor and will recommend CH follow-up. Erline Levine

## 2015-04-19 NOTE — Progress Notes (Signed)
Placed bite block in patients mouth due to clamping down on tube causing inc in peak pressure.

## 2015-04-19 NOTE — Progress Notes (Signed)
eLink Physician-Brief Progress Note Patient Name: Angel Cruz DOB: 02-23-1955 MRN: [id number]   Date of Service  04/19/2015  HPI/Events of Note  Patient with myoclonic jerks s/p code on normothermia protocol.  On PRN sedation with fentanyl/versed  eICU Interventions  Plan: Change to cont fentanyl gtt with prn versed     Intervention Category Intermediate Interventions: Other:  DETERDING,ELIZABETH 04/19/2015, 12:29 AM

## 2015-04-19 NOTE — Progress Notes (Signed)
ANTICOAGULATION CONSULT NOTE - Follow Up Consult  Pharmacy Consult for Heparin Indication: chest pain/ACS  Allergies  Allergen Reactions  . Ace Inhibitors     Kidney failure  . Valsartan Other (See Comments)    Kidney failure    Patient Measurements:    Ht: 67 in  Wt: 112.5 kg  IBW: 66 kg  Heparin Dosing Weight: 91 kg  Vital Signs: Temp: 96.8 F (36 C) (03/10 0200) BP: 114/61 mmHg (03/10 0000) Pulse Rate: 78 (03/10 0200)  Labs:  Recent Labs  05/02/2015 1748 04/27/2015 1801 05/03/2015 2207 04/19/15 0148  HGB 10.2* 11.6*  --   --   HCT 33.4* 34.0*  --   --   PLT 198  --   --   --   APTT  --   --  190*  --   LABPROT 15.6*  --  15.8*  --   INR 1.22  --  1.24  --   HEPARINUNFRC  --   --   --  0.50  CREATININE 3.24* 2.70* 3.05*  --   TROPONINI <0.03  --  0.42*  --     Estimated Creatinine Clearance: 32 mL/min (by C-G formula based on Cr of 3.05).  Assessment: 60 y.o. M on heparin for r/o ACS. Trop up to 0.42. CT scan neg for PE. Heparin level therapeutic on 1600 units/hr. No bleeding noted.  Goal of Therapy:  Heparin level 0.3-0.7 units/ml Monitor platelets by anticoagulation protocol: Yes   Plan:  Continue heparin 1600 units/hr  F/u 6 hr confirmatory level  Christoper Fabian, PharmD, BCPS Clinical pharmacist, pager [phone] 04/19/2015,2:54 AM

## 2015-04-19 NOTE — H&P (Addendum)
PULMONARY / CRITICAL CARE MEDICINE   Name: Angel Cruz MRN: [id number] DOB: [birth year]    ADMISSION DATE:  04/21/2015 CONSULTATION DATE:  04/15/2015  REFERRING MD:  EDP Dr Rancour  CHIEF COMPLAINT:  Cardiac arrest  HISTORY OF PRESENT ILLNESS:   60 year old male with PMH as below, which includes DM, HTN, HLD, CKD III, Ischemic cardiomyopathy, OSA, and recent carpal tunnel surgery, yesterday 3/8. Surgery complicated by pre-op hypoxia while in supine position to 82% on Room air.Wife states that CHF has been bad recently with increased swelling to BLE with pitting. Hypoxia improved once HOB was raised, but surgery was done without anesthesia due to this risk. Nerve block was used. Otherwise surgery was uncomplicated. He was discharged to home with hydrocodone which was managed by his wife and it would be impossible for him to have taken more than ordered  3/9 He was in yard with family talking to neighbors and suffered a syncopal episode. Family helped him into the house, where he insisted he was fine, but then promptly became unresponsive and lost his pulse per family. 911 was called and family started CPR within 2-3 mins of arrest. Upon EMS arrival CPR was in process. ROSC was achieved at some point en route, but pulses were lost again prior to arrival in ED. Coded for at least 10 mins in ED prior to ROSC. Total downtime unclear due to ROSC en route, however, total time from arrest to final ROSC 45 mins. Cardiology was called with concern for STEMI. They did not think this was STEMI, TEE was performed in ED and noted RMPA thrombus, no RV dilation was noted. Patient could not have CTA due to CKD. Patient requiring low dose epinephrine infusion to maintain blood pressure. PCCM consulted.   PAST MEDICAL HISTORY :  He  has a past medical history of Diabetes mellitus without complication (HCC); Hypertension; Hyperlipidemia; Depression; Rotator cuff tear; CKD (chronic kidney disease), stage III; Ischemic  cardiomyopathy; CAD (coronary artery disease); Morbid obesity (HCC); CHF (congestive heart failure) (HCC); OSA (obstructive sleep apnea); Sleep apnea; Hypotension; CHF (congestive heart failure) (HCC); and Myocardial infarction (HCC).  PAST SURGICAL HISTORY: He  has past surgical history that includes Cardiac catheterization (10/19/2013); Uvulectomy (1995); Atrial fibrillation ablation (1995); Elbow surgery (Left); Rotator cuff repair; Carpal tunnel release; Carpal tunnel release (Left, 04/17/2015); and Tennis elbow release/nirschel procedure (Left, 04/17/2015).  Allergies  Allergen Reactions  . Ace Inhibitors     Kidney failure  . Valsartan Other (See Comments)    Kidney failure    No current facility-administered medications on file prior to encounter.   Current Outpatient Prescriptions on File Prior to Encounter  Medication Sig  . amLODipine (NORVASC) 5 MG tablet Take 1 tablet (5 mg total) by mouth daily. (Patient taking differently: Take 5 mg by mouth 2 (two) times daily. )  . aspirin EC 81 MG tablet Take 81 mg by mouth daily.  . B-D UF III MINI PEN NEEDLES 31G X 5 MM MISC USE AS DIRECTED ONCE DAILY  . carvedilol (COREG) 6.25 MG tablet Take 0.5 tablets (3.125 mg total) by mouth 2 (two) times daily with a meal.  . CRESTOR 20 MG tablet TAKE 1 TABLET(20 MG) BY MOUTH AT BEDTIME  . Cyanocobalamin (VITAMIN B-12 PO) Take 1 tablet by mouth daily.  . escitalopram (LEXAPRO) 10 MG tablet Take 1 tablet (10 mg total) by mouth daily. (Patient taking differently: Take 10 mg by mouth at bedtime. )  . gabapentin (NEURONTIN) 400 MG capsule  Take 1 capsule (400 mg total) by mouth 3 (three) times daily.  Marland Kitchen HYDROcodone-acetaminophen (NORCO) 5-325 MG tablet Take 1-2 tablets by mouth every 6 (six) hours as needed.  . Insulin Degludec (TRESIBA FLEXTOUCH) 200 UNIT/ML SOPN Inject 3 Units into the skin every evening.  . meloxicam (MOBIC) 15 MG tablet Take 1 tablet (15 mg total) by mouth daily.  . nitroGLYCERIN  (NITROSTAT) 0.4 MG SL tablet Place 0.4 mg under the tongue every 5 (five) minutes as needed for chest pain. Reported on 04/17/2015  . rOPINIRole (REQUIP) 0.5 MG tablet TAKE 1 TABLET(0.5 MG) BY MOUTH AT BEDTIME  . sodium bicarbonate 650 MG tablet Take 1 tablet by mouth 2 (two) times daily. Reported on 04/17/2015  . traZODone (DESYREL) 50 MG tablet TAKE 1/2 TO 1 AND 1/2 TABLETS(25 TO 75 MG) BY MOUTH AT BEDTIME AS NEEDED FOR SLEEP  . Vitamin D, Ergocalciferol, (DRISDOL) 50000 units CAPS capsule TK ONE C PO  Q WEEK    FAMILY HISTORY:  His indicated that his mother is deceased. He indicated that his father is alive. He indicated that only one of his two brothers is alive.   SOCIAL HISTORY: He  reports that he has never smoked. He has never used smokeless tobacco. He reports that he does not drink alcohol or use illicit drugs.  REVIEW OF SYSTEMS:   Unable to obtain because pt is unresponsive.  SUBJECTIVE:  On normothermia protocol.   VITAL SIGNS: BP 116/56 mmHg  Pulse 74  Temp(Src) 97.5 F (36.4 C) (Core (Comment))  Resp 23  Ht  (1.753 m)  Wt 269 lb 6.4 oz (122.2 kg)  BMI 39.77 kg/m2  SpO2 94%  HEMODYNAMICS: CVP:  [8 mmHg-11 mmHg] 11 mmHg  VENTILATOR SETTINGS: Vent Mode:  [-] PRVC FiO2 (%):  [50 %-100 %] 80 % Set Rate:  [16 bmp-28 bmp] 28 bmp Vt Set:  [520 mL-530 mL] 530 mL PEEP:  [5 cmH20-10 cmH20] 10 cmH20 Plateau Pressure:  [16 cmH20-30 cmH20] 28 cmH20  INTAKE / OUTPUT: I/O last 3 completed shifts: In: 1344.2 [I.V.:1344.2] Out: 75 [Urine:75]  PHYSICAL EXAMINATION: General:  Obese, no distress Neuro:  Comatose, Pupils reactive HEENT:  Moist mucus membranes.  Cardiovascular:  RRR, no MRG, trace edema Lungs:  Clear, no wheeze or crackles. Abdomen:  Soft, non-distended Musculoskeletal:  No acute deformity Skin:  Grossly intact  LABS:  BMET  Recent Labs Lab 04/17/2015 1748 05/02/2015 1801 April 20, 2015 2207 04/19/15 0530  NA 141 141 141 145  K 4.7 4.6 4.3 5.7*  CL  108 106 109 110  CO2 19*  --  21* 23  BUN 36* 44* 36* 39*  CREATININE 3.24* 2.70* 3.05* 3.42*  GLUCOSE 274* 259* 229* 93    Electrolytes  Recent Labs Lab 20-Apr-2015 1748 04/24/2015 2207 04/19/15 0530  CALCIUM 8.8* 8.2* 8.2*  MG  --   --  2.0  PHOS  --   --  6.9*    CBC  Recent Labs Lab 05/02/2015 1748 05/09/2015 1801 04/19/15 0530  WBC 10.7*  --  9.8  HGB 10.2* 11.6* 10.1*  HCT 33.4* 34.0* 32.2*  PLT 198  --  200    Coag's  Recent Labs Lab 05/09/2015 2207 04/19/15 0242 04/19/15 0935  APTT 190*  --   --   INR 1.24 1.30 1.24    Sepsis Markers  Recent Labs Lab 05/09/2015 1801 April 20, 2015 2215 04/19/15 0242  LATICACIDVEN 6.07* 1.83 1.3    ABG  Recent Labs Lab 20-Apr-2015 2032  PHART 7.224*  PCO2ART 49.3*  PO2ART 52.0*    Liver Enzymes  Recent Labs Lab May 01, 2015 1748  AST 39  ALT 19  ALKPHOS 80  BILITOT 0.3  ALBUMIN 2.9*    Cardiac Enzymes  Recent Labs Lab May 01, 2015 1748 05/01/2015 2207 04/19/15 0242  TROPONINI <0.03 0.42* 0.52*    Glucose  Recent Labs Lab 04/17/15 0613 04/17/15 1406 04/19/15 0002 04/19/15 0755  GLUCAP 94 81 241* 92    Imaging Ct Head Wo Contrast  2015/05/01  CLINICAL DATA:  Cardiac arrest EXAM: CT HEAD WITHOUT CONTRAST TECHNIQUE: Contiguous axial images were obtained from the base of the skull through the vertex without intravenous contrast. COMPARISON:  10/30/2014 FINDINGS: Ventricle size normal. Negative for acute infarct. No edema or mass. No intracranial hemorrhage Calvarium intact. Mucosal edema throughout the paranasal sinuses without air-fluid level. IMPRESSION: Negative CT of the brain. Chronic sinusitis Electronically Signed   By: Marlan Palau M.D.   On: May 01, 2015 19:20   Ct Angio Chest Pe W/cm &/or Wo Cm  05-01-2015  CLINICAL DATA:  Status post cardiac arrest. Transesophageal echo with question pulmonary artery thrombus. Patient with diminished renal function, however patient's family wishes to proceed. EXAM: CT  ANGIOGRAPHY CHEST WITH CONTRAST TECHNIQUE: Multidetector CT imaging of the chest was performed using the standard protocol during bolus administration of intravenous contrast. Multiplanar CT image reconstructions and MIPs were obtained to evaluate the vascular anatomy. CONTRAST:  OMNIPAQUE IOHEXOL 350 MG/ML SOLN COMPARISON:  Chest radiographs earlier this day. FINDINGS: No filling defects in the pulmonary arteries to the segmental level, the subsegmental levels cannot be assessed secondary to contrast bolus timing, body habitus, and significant lower lobe atelectasis. Multi chamber cardiomegaly. Normal caliber thoracic aorta without dissection. Coronary artery calcifications are seen. No pericardial effusion. Prominent mediastinal and bilateral hilar lymph nodes are likely reactive. Endotracheal tube, enteric tube, and right central line in place. Moderate bilateral pleural effusions. There is adjacent compressive atelectasis, involving near complete right lower lobe, large portion of left lower lobe, and to a lesser extent both upper lobes. Atelectasis in the right middle lobe also seen. No definite findings of pulmonary edema. Trachea and bronchi are patent. Minimal upper abdomen included.  The stomach is distended. Nondisplaced fractures of multiple bilateral anterior ribs, likely CPR related. No pneumothorax. Review of the MIP images confirms the above findings. IMPRESSION: 1. No pulmonary embolus to the segmental level. 2. Moderate bilateral pleural effusions. There is adjacent compressive atelectasis throughout the lungs. Multi chamber cardiomegaly. 3. Nondisplaced bilateral anterior rib fractures, likely CPR related. Electronically Signed   By: Rubye Oaks M.D.   On: 01-May-2015 23:27   Dg Chest Port 1 View  May 01, 2015  CLINICAL DATA:  Central line placement. EXAM: PORTABLE CHEST 1 VIEW COMPARISON:  05/01/15 FINDINGS: Endotracheal tube with tip measuring 6 cm above the carina. Enteric tube tip  is off the field of view but below the left hemidiaphragm. A right central venous catheter has been placed with tip over the low SVC region. No pneumothorax. Shallow inspiration with atelectasis in the lung bases. Cardiac enlargement. Perihilar infiltration suggesting mild edema. IMPRESSION: Appliances appear in satisfactory location. Shallow inspiration with atelectasis in the lung bases. Cardiac enlargement with mild perihilar edema. Electronically Signed   By: Burman Nieves M.D.   On: 01-May-2015 22:22   Dg Chest Portable 1 View  2015-05-01  CLINICAL DATA:  Cardiac arrest, ETT, NGT EXAM: PORTABLE CHEST 1 VIEW COMPARISON:  02/10/2015 FINDINGS: Endotracheal tube is in place, 4.2 cm  above carina. Nasogastric tube has been placed, tip beyond the image beyond the lower esophagus. There is elevation of the right hemidiaphragm, more prominent than seen on prior images. Heart is enlarged. There is airspace filling opacity bilaterally but more prominent in the left upper lobe. IMPRESSION: 1. Interval intubation and placement of nasogastric tube. 2. Cardiomegaly and probable edema. 3. Question of infiltrate in the left upper lobe. Electronically Signed   By: Norva Pavlov M.D.   On: 04/26/2015 18:33    STUDIES:  TEE 3/9 >>> prelim report with clot in RMPA but not seen on CTA.No cor pulmonale. CT head 3/9 >>> Negative CT of the brain. Chronic sinusitis CTA 3/9 >> No PE, B/L effusions  CULTURES: BCx2 3/9 > Urine 3/9 >  ANTIBIOTICS: None  SIGNIFICANT EVENTS: 3/8 carpal tunnel surgery 3/9 Cardiac arrest, admit  LINES/TUBES: OETT 3/9 >>> Rt IJ 3/9 >> Rt radial A line 3/10 >>  DISCUSSION: 60 year old male with PMH as above suffered cardiac arrest 3/9 with up to 45 mins estimated PEA downtime. Hypotensive requiring epi drip which was switched to norepi. TEE done by cards found probable PE but CTA is negative. On normothermia protocol.  ASSESSMENT / PLAN:  PULMONARY A: Acute hypoxemic  respiratory failure secondary to cardiac arrest Concern PE OSA  P:   Full vent support ABG Vent bundle PE ruled out. Will d/c heparin drip  CARDIOVASCULAR A:  Cardiac arrest PEA  CAD - known LAD occlusion with collaterals Chronic CHF  Elevated lactic  P:  Telemetry monitoring MAP goal 71mm/Hg Norepi infusion for MAP goal Trend troponin Trend lactic acid Cardiology following Follow final TEE report.   RENAL A:   Acute on CKD. Received contrast with CTA NAG acidosis P:   Kayexelate for hyperkalemia. Insulin and amp of dextrose. Recheck BMP May need to go on dialysis if no turn around Start bicarb drip @ 125/hr  GASTROINTESTINAL A:   No acute issues  P:   NPO PPI  HEMATOLOGIC A:   Stable P:  D/C heaprin gtt  INFECTIOUS A:   No acute issues P:   Monitor off ABX  ENDOCRINE A:   DM P:   CBG monitoring and SSI q 4 hours  NEUROLOGIC A:   Acute anoxic/metabolic encephalopathy in setting cardiac arrest. CT head negative.  P:   RASS goal: 0 EEG pending  FAMILY  - Updates: Family updated by [facility] in ED 3/9 - Inter-disciplinary family meet or Palliative Care meeting due by:  3/16  Critical care time- 35 mins.  Chilton Greathouse MD [facility] Pager [phone] If no answer or after 3pm call: [phone] 04/19/2015, 10:19 AM

## 2015-04-19 NOTE — Progress Notes (Signed)
Advanced ETT 2cm per CCM order. Tube is now 27 at the lip.

## 2015-04-19 NOTE — Progress Notes (Signed)
Titrated FIO2 to 80% sat 94%.

## 2015-04-20 ENCOUNTER — Inpatient Hospital Stay (HOSPITAL_COMMUNITY): Payer: Medicaid Other

## 2015-04-20 LAB — BASIC METABOLIC PANEL
ANION GAP: 16 — AB (ref 5–15)
BUN: 49 mg/dL — ABNORMAL HIGH (ref 6–20)
CALCIUM: 7.4 mg/dL — AB (ref 8.9–10.3)
CHLORIDE: 104 mmol/L (ref 101–111)
CO2: 20 mmol/L — AB (ref 22–32)
Creatinine, Ser: 4.28 mg/dL — ABNORMAL HIGH (ref 0.61–1.24)
GFR calc non Af Amer: 14 mL/min — ABNORMAL LOW (ref >60)
GFR, EST AFRICAN AMERICAN: 16 mL/min — AB (ref >60)
GLUCOSE: 121 mg/dL — AB (ref 65–99)
POTASSIUM: 4.9 mmol/L (ref 3.5–5.1)
Sodium: 140 mmol/L (ref 135–145)

## 2015-04-20 LAB — RENAL FUNCTION PANEL
ALBUMIN: 2.6 g/dL — AB (ref 3.5–5.0)
Anion gap: 16 — ABNORMAL HIGH (ref 5–15)
BUN: 55 mg/dL — AB (ref 6–20)
CALCIUM: 7.1 mg/dL — AB (ref 8.9–10.3)
CHLORIDE: 101 mmol/L (ref 101–111)
CO2: 20 mmol/L — ABNORMAL LOW (ref 22–32)
Creatinine, Ser: 4.79 mg/dL — ABNORMAL HIGH (ref 0.61–1.24)
GFR calc Af Amer: 14 mL/min — ABNORMAL LOW (ref >60)
GFR, EST NON AFRICAN AMERICAN: 12 mL/min — AB (ref >60)
GLUCOSE: 138 mg/dL — AB (ref 65–99)
Phosphorus: 6.8 mg/dL — ABNORMAL HIGH (ref 2.5–4.6)
Potassium: 5.2 mmol/L — ABNORMAL HIGH (ref 3.5–5.1)
Sodium: 137 mmol/L (ref 135–145)

## 2015-04-20 LAB — CBC
HEMATOCRIT: 28 % — AB (ref 39.0–52.0)
HEMOGLOBIN: 9 g/dL — AB (ref 13.0–17.0)
MCH: 27.4 pg (ref 26.0–34.0)
MCHC: 32.1 g/dL (ref 30.0–36.0)
MCV: 85.1 fL (ref 78.0–100.0)
Platelets: 163 10*3/uL (ref 150–400)
RBC: 3.29 MIL/uL — AB (ref 4.22–5.81)
RDW: 15 % (ref 11.5–15.5)
WBC: 6.5 10*3/uL (ref 4.0–10.5)

## 2015-04-20 LAB — CARBOXYHEMOGLOBIN
Carboxyhemoglobin: 0.9 % (ref 0.5–1.5)
METHEMOGLOBIN: 1 % (ref 0.0–1.5)
O2 Saturation: 90.5 %
Total hemoglobin: 10.2 g/dL — ABNORMAL LOW (ref 13.5–18.0)

## 2015-04-20 LAB — POCT I-STAT 3, ART BLOOD GAS (G3+)
Acid-base deficit: 5 mmol/L — ABNORMAL HIGH (ref 0.0–2.0)
BICARBONATE: 20.7 meq/L (ref 20.0–24.0)
O2 Saturation: 81 %
PCO2 ART: 42.1 mmHg (ref 35.0–45.0)
TCO2: 22 mmol/L (ref 0–100)
pH, Arterial: 7.303 — ABNORMAL LOW (ref 7.350–7.450)
pO2, Arterial: 52 mmHg — ABNORMAL LOW (ref 80.0–100.0)

## 2015-04-20 LAB — GLUCOSE, CAPILLARY
GLUCOSE-CAPILLARY: 120 mg/dL — AB (ref 65–99)
Glucose-Capillary: 96 mg/dL (ref 65–99)

## 2015-04-20 MED ORDER — MORPHINE BOLUS VIA INFUSION
5.0000 mg | INTRAVENOUS | Status: DC | PRN
  Filled 2015-04-20: qty 20

## 2015-04-20 MED ORDER — NOREPINEPHRINE BITARTRATE 1 MG/ML IV SOLN
0.0000 ug/min | INTRAVENOUS | Status: DC
  Administered 2015-04-20: 10 ug/min via INTRAVENOUS
  Filled 2015-04-20: qty 4

## 2015-04-20 MED ORDER — ATROPINE SULFATE 1 % OP SOLN
4.0000 [drp] | OPHTHALMIC | Status: DC | PRN
  Filled 2015-04-20: qty 2

## 2015-04-20 MED ORDER — IPRATROPIUM-ALBUTEROL 0.5-2.5 (3) MG/3ML IN SOLN: 3.0000 mL | RESPIRATORY_TRACT | Status: DC | PRN

## 2015-04-20 MED ORDER — LORAZEPAM 2 MG/ML IJ SOLN
4.0000 mg | Freq: Once | INTRAMUSCULAR | Status: AC
  Administered 2015-04-20: 4 mg via INTRAVENOUS

## 2015-04-20 MED ORDER — ACETAMINOPHEN 650 MG RE SUPP: 650.0000 mg | RECTAL | Status: DC | PRN

## 2015-04-20 MED ORDER — MORPHINE SULFATE 25 MG/ML IV SOLN
1.0000 mg/h | INTRAVENOUS | Status: DC
  Filled 2015-04-20 (×2): qty 10

## 2015-04-20 MED ORDER — LORAZEPAM 2 MG/ML IJ SOLN
INTRAMUSCULAR | Status: AC
  Filled 2015-04-20: qty 2

## 2015-04-21 ENCOUNTER — Encounter: Payer: Self-pay | Admitting: Family Medicine

## 2015-04-22 ENCOUNTER — Telehealth: Payer: Self-pay

## 2015-04-22 NOTE — Telephone Encounter (Signed)
On 04/22/2015 I received a death certificate from [facility] [employer] (Faxed). The death certificate is for cremation. The patient is a patient of Doctor Sood. The death certificate will be taken to Pulmonary Unit tomorrow pm for signature. On 05-22-15 I received the death certificate back from Doctor [place]. I got the death certificate ready and faxed the death certificate over to the funeral home per their request.

## 2015-04-23 MED FILL — Medication: Qty: 1 | Status: AC

## 2015-04-24 ENCOUNTER — Telehealth: Payer: Self-pay

## 2015-04-24 NOTE — Telephone Encounter (Signed)
On 04/24/2015 I received a death certificate from [facility] [employer] (orginal). The death certificate is for cremation. The patient is a patient of Doctor Sood. The death certificate will be taken to Pulmonary unit this pm for signature. On 05/11/15 I received the death certificate back from Doctor [place]. I got the death certiifcate ready and called the funeral home to let them know I was mailing the death certificate to the [facility] per their request.

## 2015-05-06 ENCOUNTER — Ambulatory Visit: Payer: Self-pay | Admitting: Family Medicine

## 2015-05-11 NOTE — Progress Notes (Signed)
230cc of Morphine wasted down the sink with Melonie Florida, RN. 20 cc of Fentanyl wasted down the sink with Melonie Florida, RN.

## 2015-05-11 NOTE — Progress Notes (Signed)
Death confirmed with Suzy Bouchard, RN by auscultating heart sounds for 2 minutes. [id number].

## 2015-05-11 NOTE — Progress Notes (Signed)
Patient O2 saturation decreasing, as well as patient heart rate fluctuating dependent on oxygen saturation. Contacted Elink MD at this time, and respiratory in room. Bag ventilated patient twice approximately 3-5 minutes, waiting for O2 sats to increase. Elink MD has patient now on ARDs protocol.Pola Corn MD notified Dr. Andria Meuse to come look at patient. With Dr. Andria Meuse assessment, Foye Deer MD further ordered CT scan of head. Will continue to monitor patient at this time. Milon Dikes, RN

## 2015-05-11 NOTE — Progress Notes (Signed)
PULMONARY / CRITICAL CARE MEDICINE   Name: Angel Cruz MRN: [id number] DOB: Jan 17, 1956    ADMISSION DATE:  05/09/2015 CONSULTATION DATE:  05/10/2015  REFERRING MD:  EDP Dr Rancour  CHIEF COMPLAINT:  Cardiac arrest  SUBJECTIVE:  Difficult to maintain oxygenation.  VITAL SIGNS: BP 178/70 mmHg  Pulse 77  Temp(Src) 99.3 F (37.4 C) (Core (Comment))  Resp 32  Ht 5\' 9"  (1.753 m)  Wt 269 lb 6.4 oz (122.2 kg)  BMI 39.77 kg/m2  SpO2 96%  HEMODYNAMICS: CVP:  [10 mmHg-25 mmHg] 25 mmHg  VENTILATOR SETTINGS: Vent Mode:  [-] PRVC FiO2 (%):  [80 %-100 %] 100 % Set Rate:  [28 bmp-32 bmp] 32 bmp Vt Set:  [530 mL] 530 mL PEEP:  [10 cmH20-12 cmH20] 12 cmH20 Plateau Pressure:  [28 cmH20-31 cmH20] 31 cmH20  INTAKE / OUTPUT: I/O last 3 completed shifts: In: 3684.9 [I.V.:3684.9] Out: 115 [Urine:115]  PHYSICAL EXAMINATION: General: on full vent support Neuro: comatose HEENT: pupils unequal Cardiac: regular Chest: b/l crackles Abd: soft Ext: 2+ edema Skin: no rashes  LABS:  BMET  Recent Labs Lab 04/19/15 0935 04/19/15 1600 04/19/15 2338  NA 142 141 140  K 5.9* 5.6* 4.9  CL 111 107 104  CO2 20* 20* 20*  BUN 40* 45* 49*  CREATININE 3.74* 4.01* 4.28*  GLUCOSE 90 70 121*    Electrolytes  Recent Labs Lab 04/19/15 0530 04/19/15 0935 04/19/15 1600 04/19/15 2338  CALCIUM 8.2* 7.8* 7.8* 7.4*  MG 2.0  --   --   --   PHOS 6.9*  --   --   --     CBC  Recent Labs Lab 05/09/2015 1748  04/13/2015 2357 04/19/15 0530 17-May-2015 0356  WBC 10.7*  --   --  9.8 6.5  HGB 10.2*  < > 10.2* 10.1* 9.0*  HCT 33.4*  < > 30.0* 32.2* 28.0*  PLT 198  --   --  200 163  < > = values in this interval not displayed.  Coag's  Recent Labs Lab 05/10/2015 2207 04/19/15 0242 04/19/15 0935  APTT 190*  --   --   INR 1.24 1.30 1.24    Sepsis Markers  Recent Labs Lab 04/21/2015 1801 04/28/2015 2215 04/19/15 0242  LATICACIDVEN 6.07* 1.83 1.3    ABG  Recent Labs Lab 04/12/2015 2032  04/19/15 05-17-15 0521  PHART 7.224* 7.222* 7.303*  PCO2ART 49.3* 49.2* 42.1  PO2ART 52.0* 80.0 52.0*    Liver Enzymes  Recent Labs Lab 04/19/2015 1748  AST 39  ALT 19  ALKPHOS 80  BILITOT 0.3  ALBUMIN 2.9*    Cardiac Enzymes  Recent Labs Lab 04/19/15 0242 04/19/15 0935 04/19/15 1600  TROPONINI 0.52* 0.38* 0.26*    Glucose  Recent Labs Lab 04/19/15 1620 04/19/15 1922 04/19/15 2323 04/19/15 2324 05-17-15 0356 05/17/15 0706  GLUCAP 127* 94 48* 112* 96 120*    Imaging Dg Chest Port 1 View  04/19/2015  CLINICAL DATA:  Readjustment of endotracheal tube EXAM: PORTABLE CHEST 1 VIEW COMPARISON:  Portable exam 1429 hours compared to 1217 hours FINDINGS: Tip of endotracheal tube projects 2.6 cm above carina. Nasogastric tube extends into stomach. RIGHT jugular central venous catheter tip projects over SVC near cavoatrial junction. External pacing leads present. Diffuse infiltrates likely pulmonary edema. Bibasilar atelectasis. No gross pleural effusion or pneumothorax. IMPRESSION: Tip of endotracheal tube now projects 2.6 cm above carina. Persistent pulmonary edema and bibasilar atelectasis. Electronically Signed   By: Ulyses Southward M.D.   On:  04/19/2015 14:46   Dg Chest Port 1 View  04/19/2015  CLINICAL DATA:  Intubation EXAM: PORTABLE CHEST 1 VIEW COMPARISON:  Apr 27, 2015 FINDINGS: Endotracheal tube is just above the carina. Recommend withdrawal of 3 cm. NG tube in the stomach. Right jugular central venous catheter tip in the region of the cavoatrial junction unchanged. No pneumothorax. Progression of bilateral airspace disease most consistent with edema. Progression of small bilateral effusions and bibasilar atelectasis. Hypoventilation of the lungs. IMPRESSION: Endotracheal tube at the carina, recommend withdrawal of 3 cm Hypoventilation with increase in bibasilar atelectasis and effusion Increase in pulmonary edema. These results will be called to the ordering clinician or  representative by the Radiologist Assistant, and communication documented in the PACS or zVision Dashboard. Electronically Signed   By: Marlan Palau M.D.   On: 04/19/2015 12:37    STUDIES:  TEE 3/9 >>> prelim report with clot in RMPA but not seen on CTA.No cor pulmonale. CT head 3/9 >>> Negative CT of the brain. Chronic sinusitis CTA 3/9 >> No PE, B/L effusions  CULTURES:  ANTIBIOTICS:  SIGNIFICANT EVENTS: 3/8 carpal tunnel surgery 3/9 Cardiac arrest, admit  LINES/TUBES: ETT 3/9 >>> Rt IJ 3/9 >> Rt radial A line 3/10 >>  DISCUSSION: 60 year old male with PMH as above suffered cardiac arrest 3/9 with up to 45 mins estimated PEA downtime. Hypotensive requiring epi drip which was switched to norepi. TEE done by cards found probable PE but CTA is negative. On normothermia protocol.  ASSESSMENT / PLAN:  PULMONARY A: Acute respiratory failure with pulmonary edema after cardiac arrest. Hx of OSA. P:   ARDS protocol F/u CXR  CARDIOVASCULAR A:  PEA cardiac arrest. Hx of CAD, diastolic CHF. P:  Monitor hemodynamics  RENAL A:   Acute kidney injury 2nd to ATN, and IV contrast. Metabolic acidosis. P:   Continue HCO3 gtt for now  GASTROINTESTINAL A:   Nutrition. P:   NPO PPI  HEMATOLOGIC A:   Anemia of critical illness. P:  F/u CBC  INFECTIOUS A:   No acute issues. P:   Monitor off ABX  ENDOCRINE A:   DM. P:   SSI  NEUROLOGIC A:   Acute anoxic/metabolic encephalopathy in setting cardiac arrest. P:   Repeat CT head 3/11  Updated pt's family.  They understand his prognosis for neuro recovery is grim.  Agree to DNR status if he arrests.  Will update them after CT head done.  CC time 31 minutes.  Coralyn Helling, MD [facility] Pulmonary/Critical Care 04/15/2015, 10:36 AM Pager:  [phone] After 3pm call: [phone]

## 2015-05-11 NOTE — Progress Notes (Signed)
Discussed CT head findings with pt's family.  They understand he will not have neuro recovery, and would not want to continue vent support.  They would like to spend more time with him before removing vent support.    Coralyn Helling, MD [facility] Pulmonary/Critical Care 2015/05/20, 1:46 PM Pager:  [phone] After 3pm call: [phone]

## 2015-05-11 NOTE — Progress Notes (Signed)
eLink Physician-Brief Progress Note Patient Name: Angel Cruz DOB: 23-Feb-1955 MRN: [id number]   Date of Service  04-28-2015  HPI/Events of Note  Now that patient is re-cooling facial twitching is noted concern for seizure activity.  eICU Interventions  Plan: One time dose of 4 mg ativan IV Monitor clinical status     Intervention Category Intermediate Interventions: Other:  [name] April 28, 2015, 2:17 AM

## 2015-05-11 NOTE — Procedures (Signed)
Extubation Procedure Note  Patient Details:   Name: Angel Cruz DOB: April 25, 1955 MRN: [id number]   Airway Documentation:     Evaluation  O2 sats: transiently fell during during procedure Complications: No apparent complications Patient did tolerate procedure well. Bilateral Breath Sounds: Clear, Diminished Suctioning: Oral, Airway No   Pt extubated according to End of Life Withdrawal Protocol to room air. Pt spo2 and HR began to fall as soon as pt was extubated. RN and family at bedside. Pt on comfort care only. RT will continue to monitor.   Carolan Shiver 04/16/2015, 3:03 PM

## 2015-05-11 NOTE — Discharge Summary (Signed)
Angel Cruz was a 60 y.o. male admitted on 04/30/2015 cardiac arrest.  He was in PEA.  He had prolonged time before ROSC.  He remained on pressors, and was difficult to oxygenate.  He had carpal tunnel surgery one day prior to admission.  His CT chest was negative for pulmonary embolism, but showed b/l pleural effusions and rib fractures.  His CT head showed global cerebral edema with effacement of sulci.  This finding was discussed with pt's family.  They understood he would not regain neurologic function, and opted to transition to comfort measures.  He was DNR, and extubated.  He expired on 05-20-2015.  Final Diagnoses: PEA cardiac arrest Acute respiratory failure with hypoxia Acute diastolic CHF Coronary artery disease Hx of obstructive sleep apnea Acute kidney injury Metabolic acidosis Lactic acidosis Anemia of critical illness Hx of Diabetes mellitus Anoxic encephalopathy   Coralyn Helling, MD [facility] Pulmonary/Critical Care 04/23/2015, 3:58 PM

## 2015-05-11 NOTE — Progress Notes (Signed)
   May 09, 2015 1343  Clinical Encounter Type  Visited With Family;Health care provider  Visit Type Initial;Critical Care;Patient actively dying;Spiritual support  Referral From Nurse  Spiritual Encounters  Spiritual Needs Prayer;Emotional;Grief support  Stress Factors  Family Stress Factors Loss;Health changes   Chaplain responded to a request from the patient's RN to help assist with the family meeting with Dr. Halford Chessman. Chaplain met with the family as they decided to move towards comfort care. Chaplain offered prayer and support, and our services are available as needed.   Jeri Lager, Chaplain 05-09-15 1:44 PM

## 2015-05-11 DEATH — deceased

## 2015-10-31 IMAGING — CT CT ANGIO CHEST
1 of 2 series · 19 of 32 positions shown · IV contrast (APPLIED)
Comparison: CT chest 01/03/2012

CLINICAL DATA: Back pain, palpitations, hypertension, recently
stopped and restarted blood pressure medication

EXAM:
CT ANGIOGRAPHY CHEST WITH CONTRAST
TECHNIQUE: Multidetector CT imaging of the chest was performed using the
standard protocol during bolus administration of intravenous
contrast. Multiplanar CT image reconstructions and MIPs were
obtained to evaluate the vascular anatomy.
CONTRAST:  100mL OMNIPAQUE IOHEXOL 350 MG/ML SOLN IV

[Series 6: cta chest · axial · 0.87mm/px · z∈[+216,+520]mm · 19 of 168 slices shown]
[im 8/168  lung]
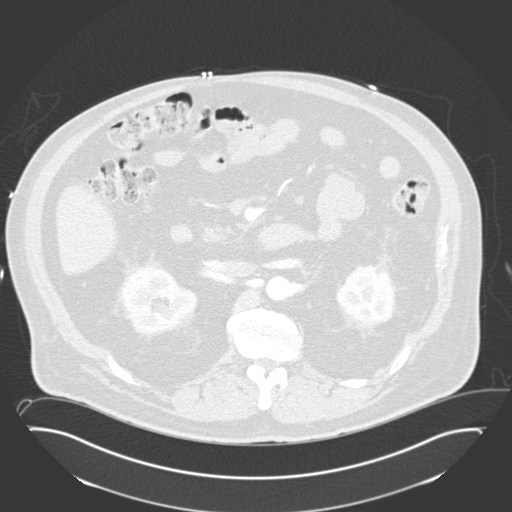
[im 16/168  soft-tissue]
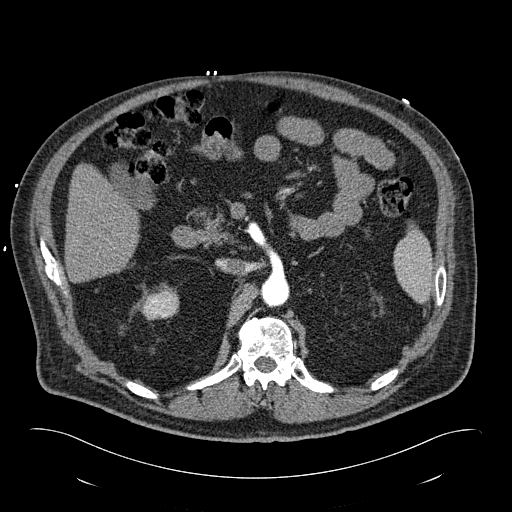
[im 23/168  lung]
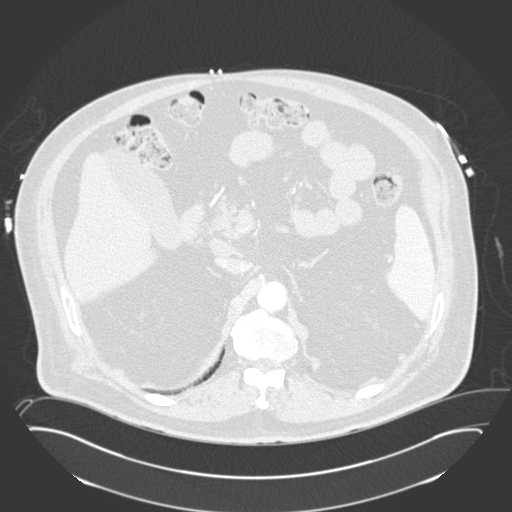
[im 31/168  soft-tissue]
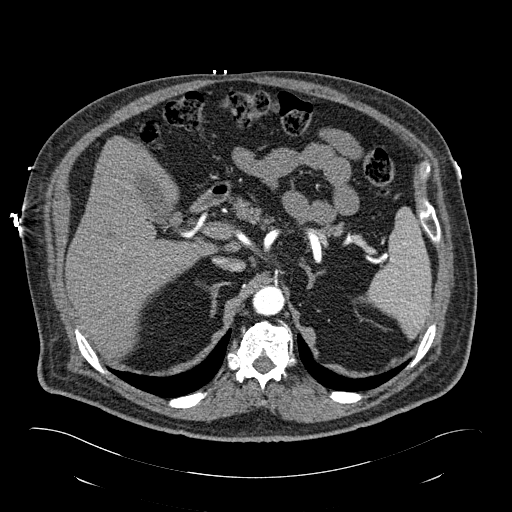
[im 38/168  lung]
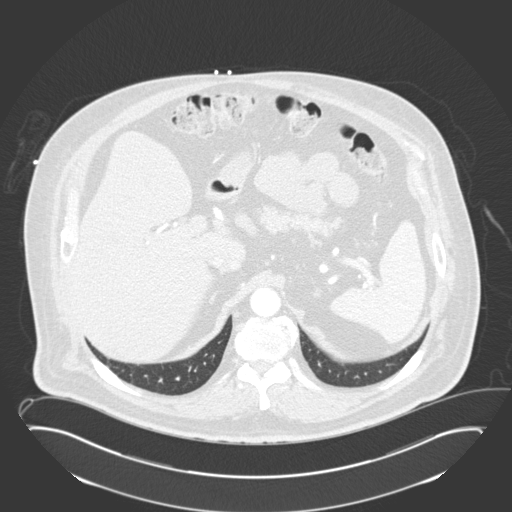
[im 54/168  soft-tissue]
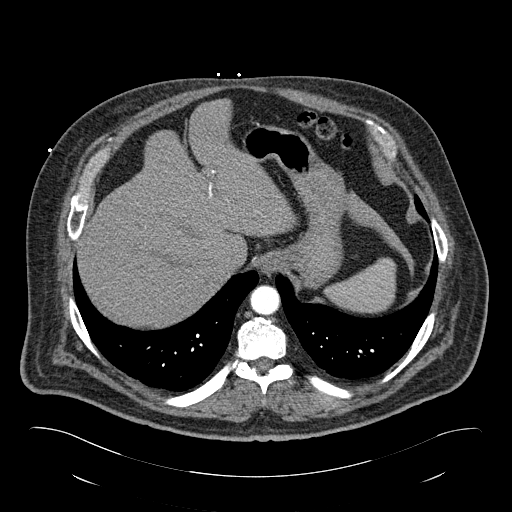
[im 61/168  lung]
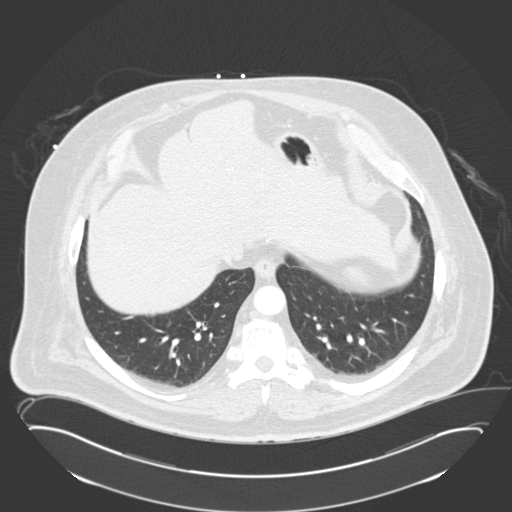
[im 69/168  soft-tissue]
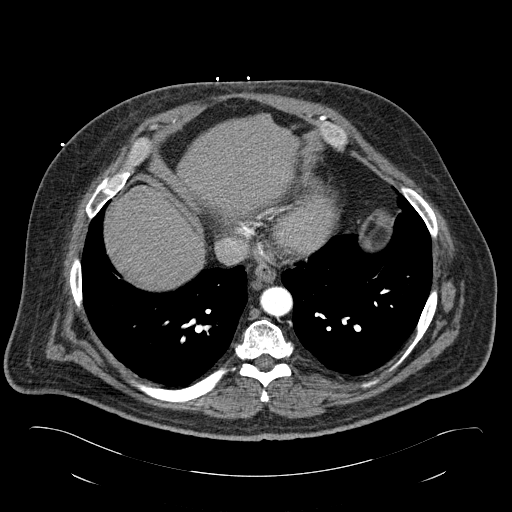
[im 76/168  lung]
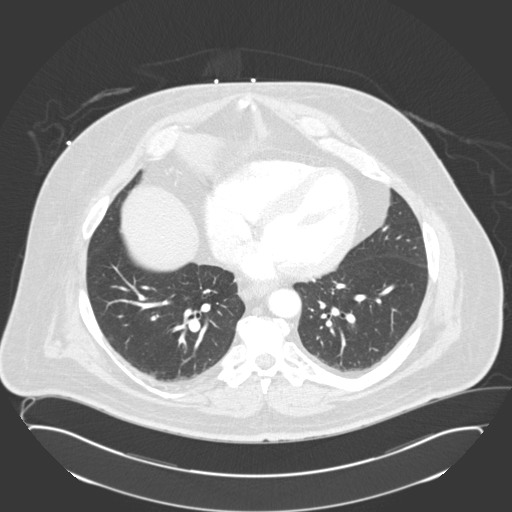
[im 84/168  soft-tissue]
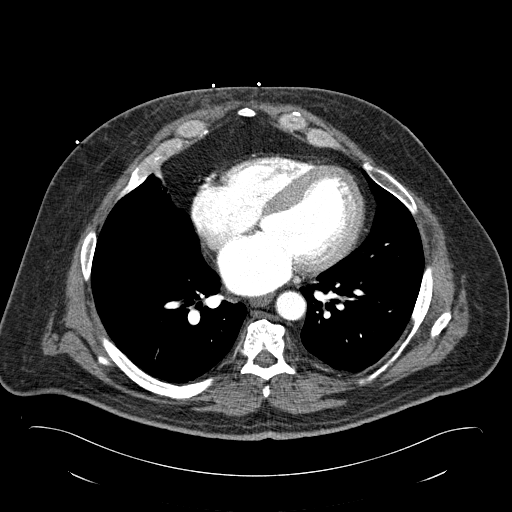
[im 92/168  lung]
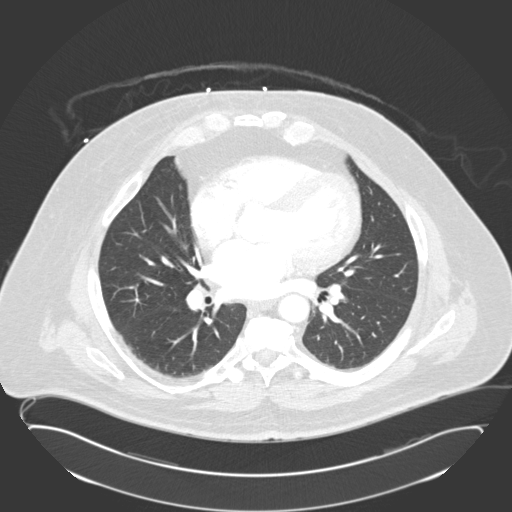
[im 99/168  soft-tissue]
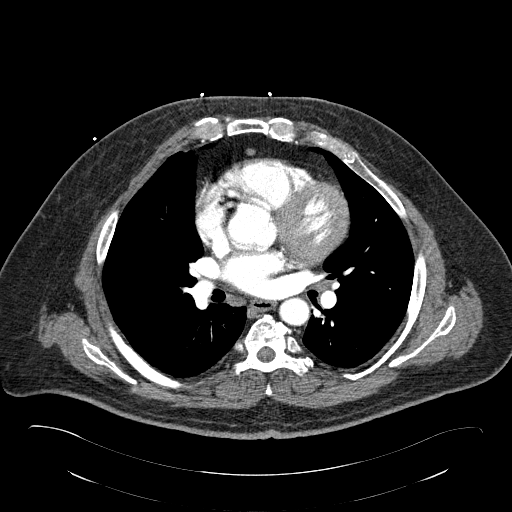
[im 107/168  lung]
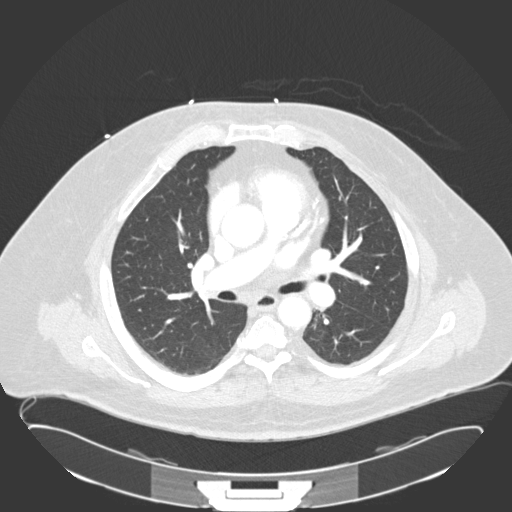
[im 114/168  soft-tissue]
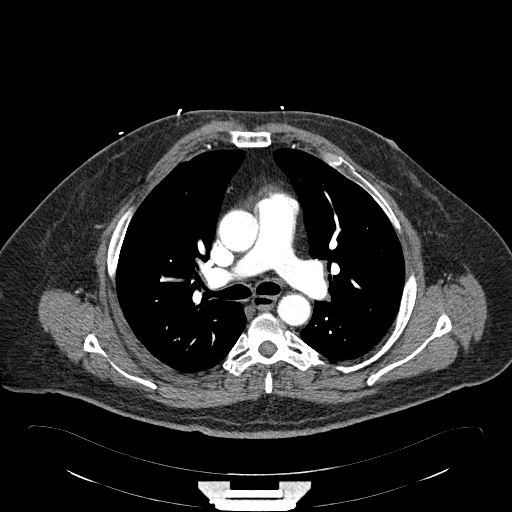
[im 130/168  lung]
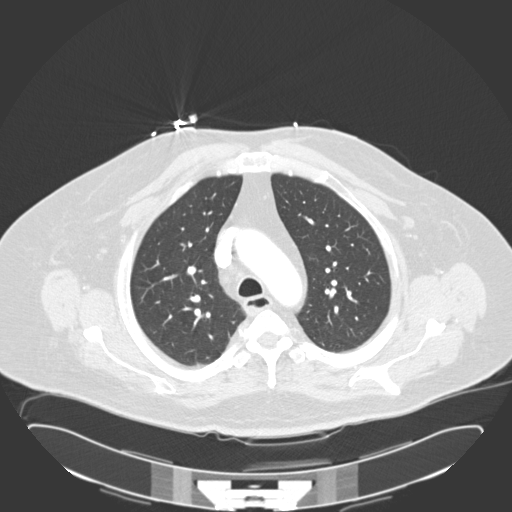
[im 137/168  soft-tissue]
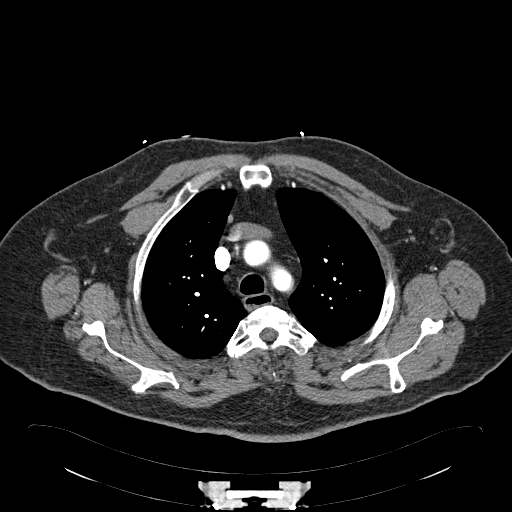
[im 145/168  lung]
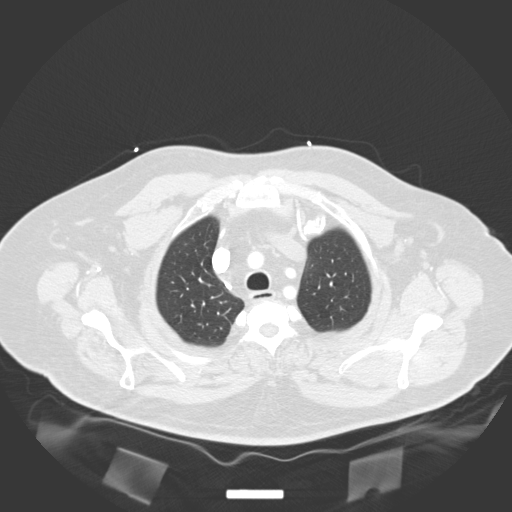
[im 152/168  soft-tissue]
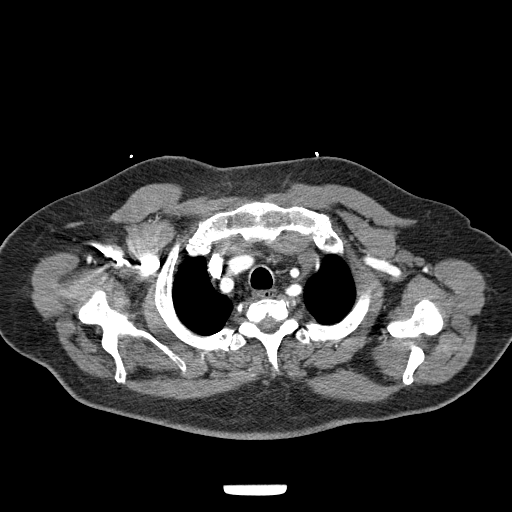
[im 160/168  lung]
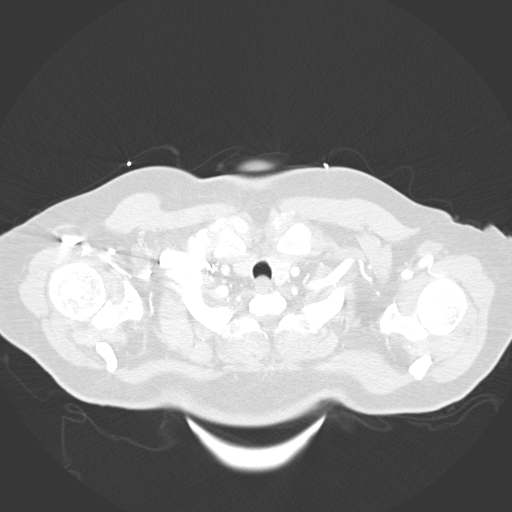

[19 of 32 positions shown; findings below may reference images not displayed]

FINDINGS: Mild scattered atherosclerotic calcifications aorta proximal great
vessels and coronary arteries.

No intramural hematoma on precontrast imaging.

Normal aortic enhancement without aneurysm or dissection.

Pulmonary arteries well opacified and patent.

No evidence of pulmonary embolism.

Mildly enlarged peripancreatic lymph node 13 mm short axis image
133.

Remaining visualized portions of upper abdomen normal appearance.

Thoracic adenopathy.

Lungs clear.

No pulmonary infiltrate, pleural effusion or pneumothorax.

No acute osseous findings.

Review of the MIP images confirms the above findings.
IMPRESSION: Mild scattered atherosclerotic calcification.

No evidence of pulmonary embolism.

No acute intrathoracic abnormalities.

Single nonspecific mildly enlarged peripancreatic lymph node 13 mm
short axis.

## 2015-10-31 IMAGING — CT CT HEAD W/O CM
1 series · 16 of 30 positions shown, 20 images · non-contrast
Comparison: None

CLINICAL DATA: Headache, neck pain, palpitations, recently
restarted blood pressure medication, hypertension, diabetes
mellitus, coronary artery disease, ischemic cardiomyopathy, stage
III chronic kidney disease

EXAM:
CT HEAD WITHOUT CONTRAST
TECHNIQUE: Contiguous axial images were obtained from the base of the skull
through the vertex without intravenous contrast.

[Series 2: head wo · axial · 0.42mm/px · z∈[+686,+815]mm · 16 of 32 slices shown, 20 images]
[im 2/32  brain]
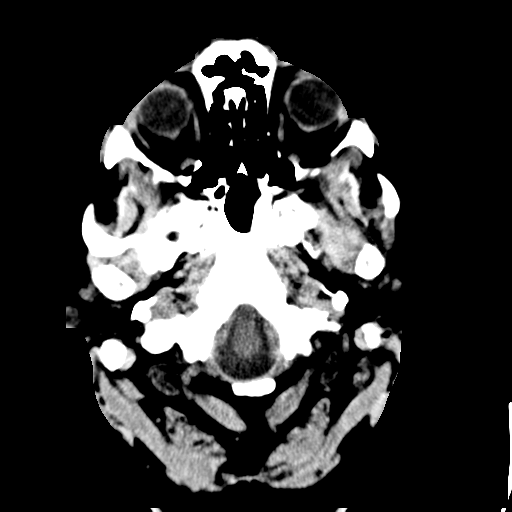
[im 2/32  bone]
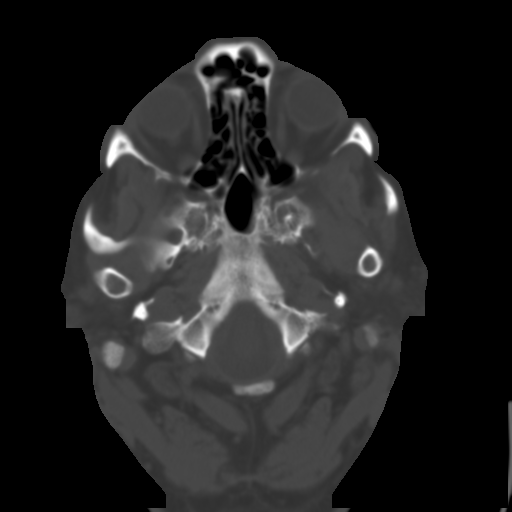
[im 4/32  brain]
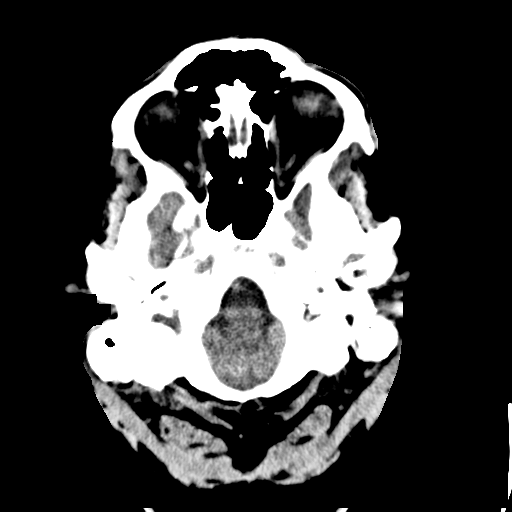
[im 6/32  brain]
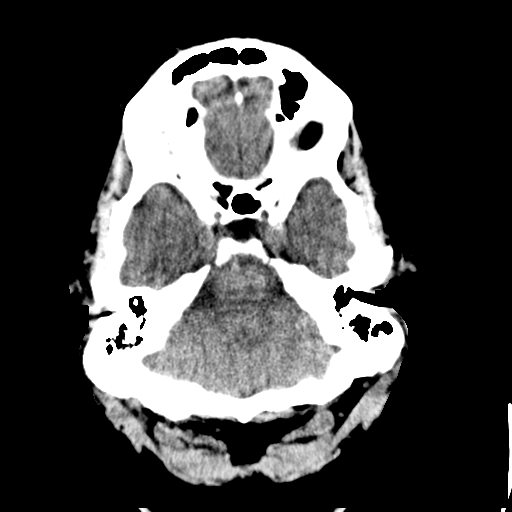
[im 8/32  brain]
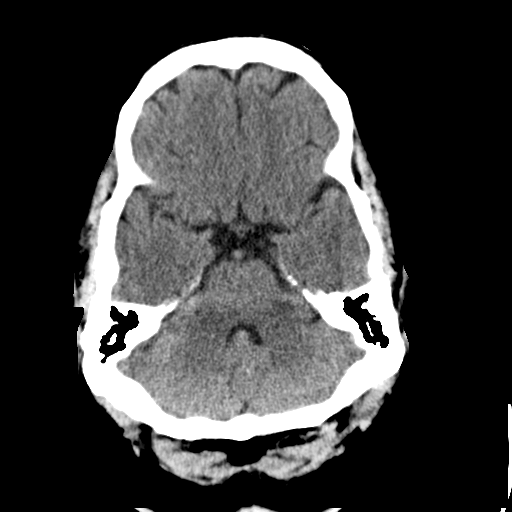
[im 9/32  brain]
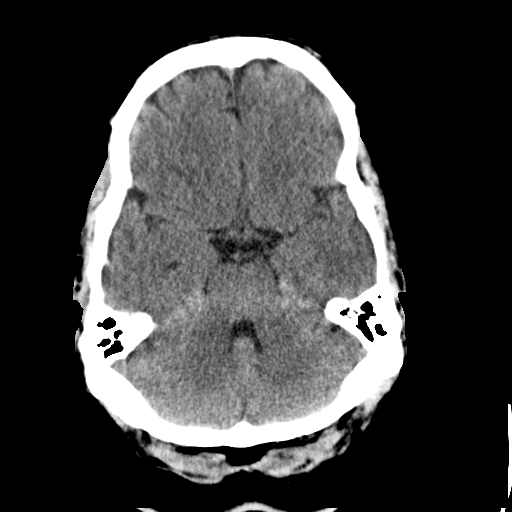
[im 9/32  bone]
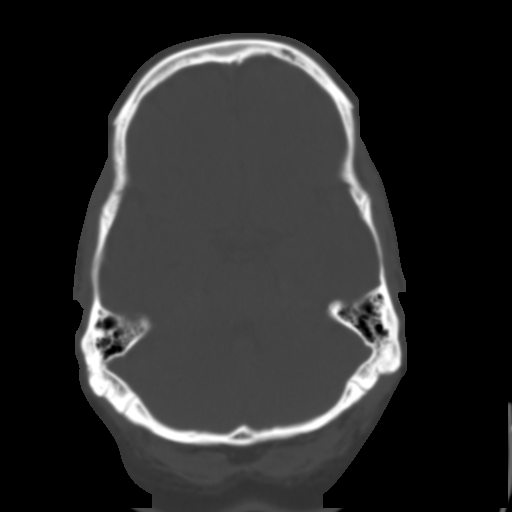
[im 11/32  brain]
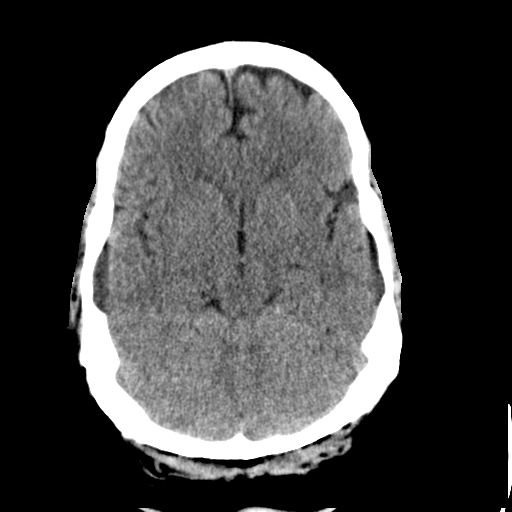
[im 13/32  brain]
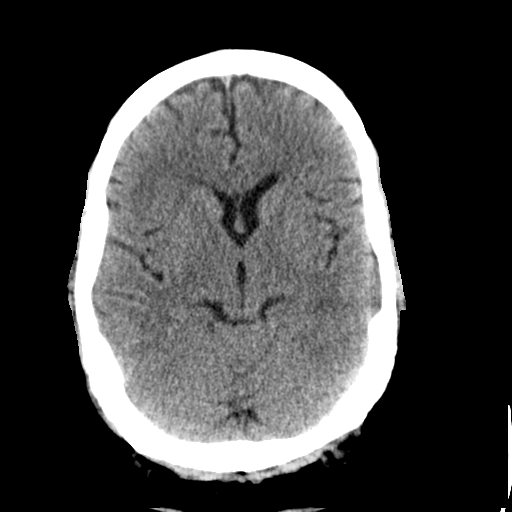
[im 15/32  brain]
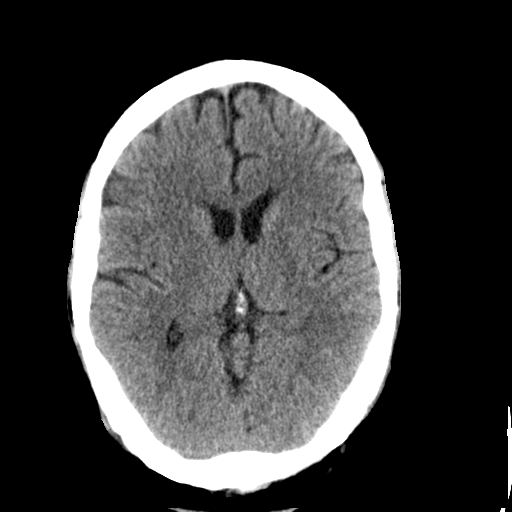
[im 17/32  brain]
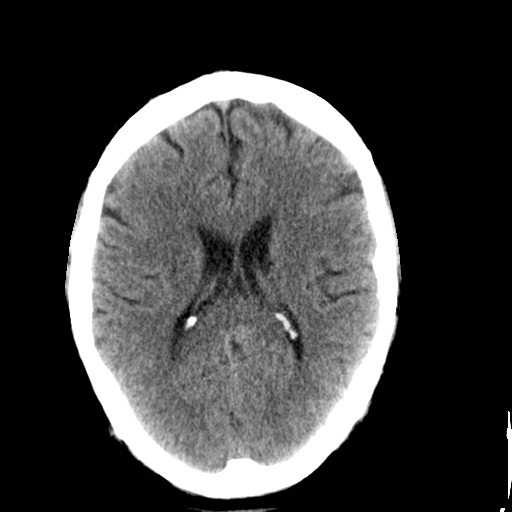
[im 17/32  bone]
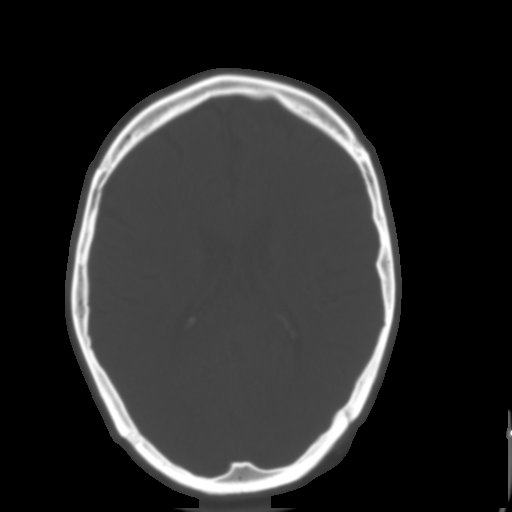
[im 19/32  brain]
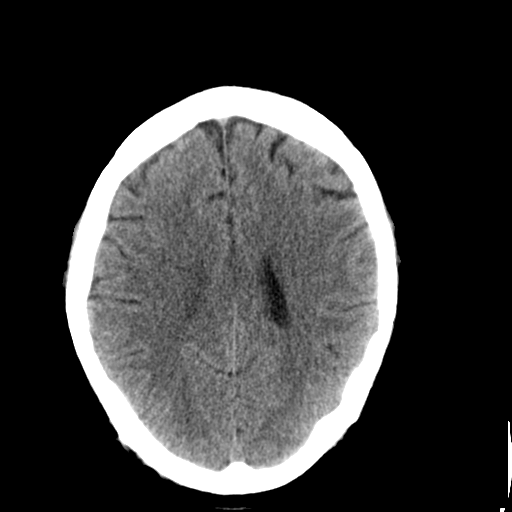
[im 21/32  brain]
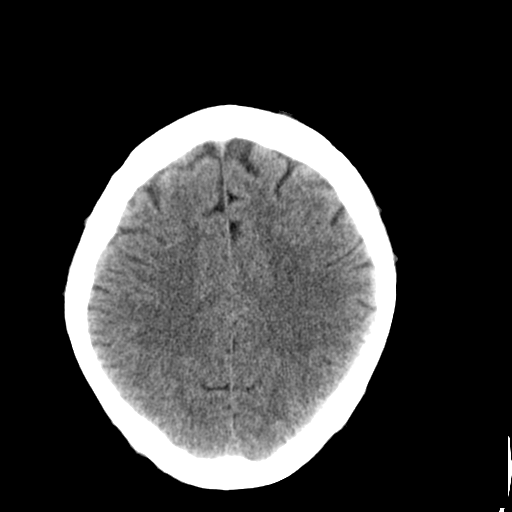
[im 23/32  brain]
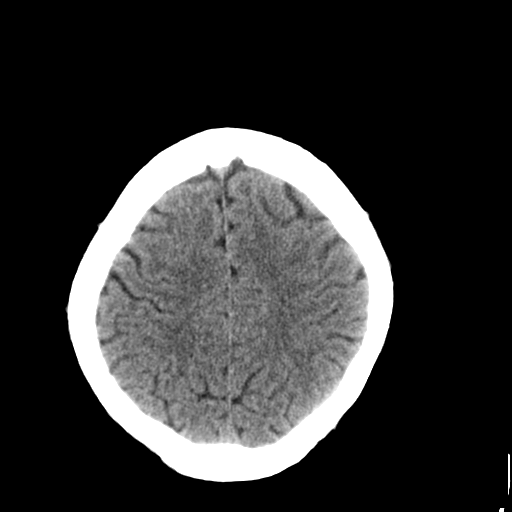
[im 24/32  brain]
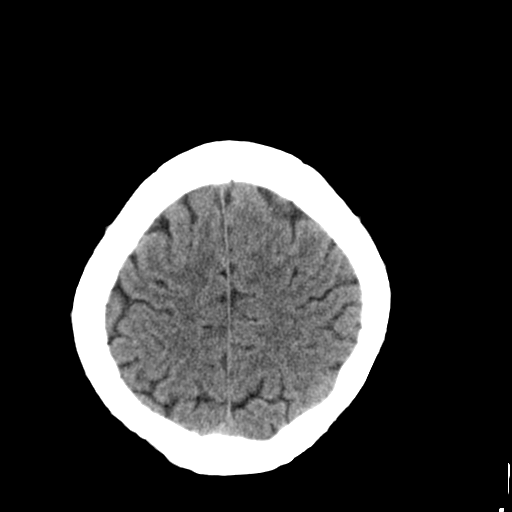
[im 24/32  bone]
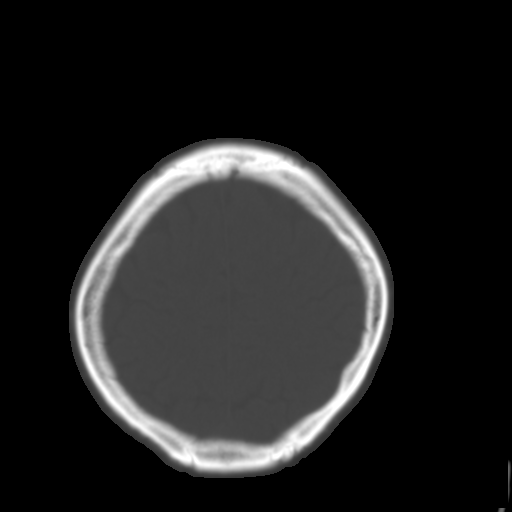
[im 26/32  brain]
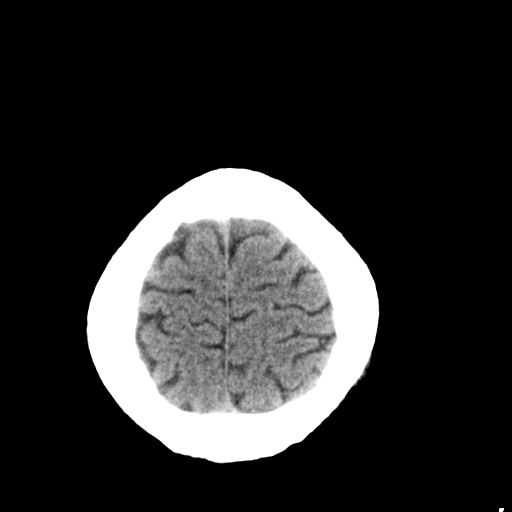
[im 28/32  brain]
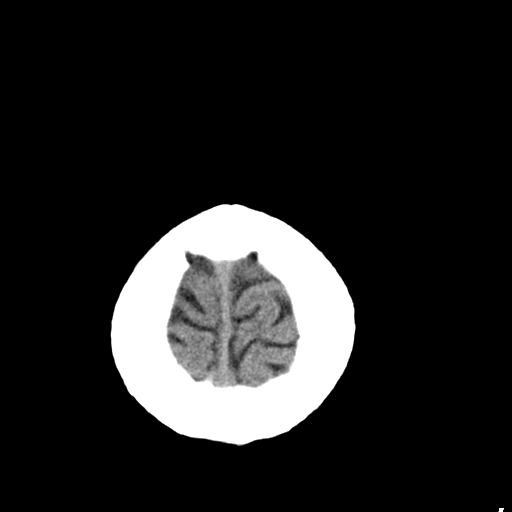
[im 30/32  brain]
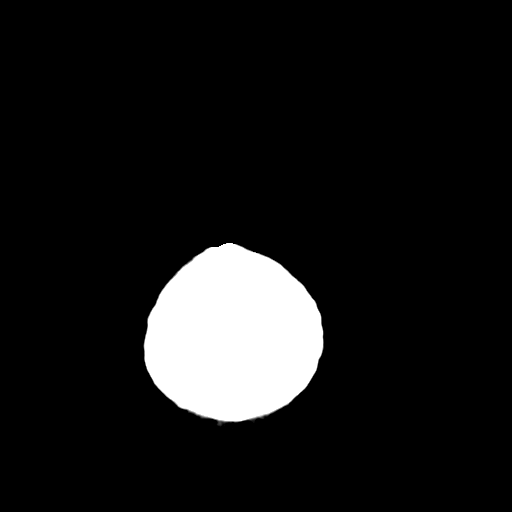

[16 of 30 positions shown; findings below may reference images not displayed]

FINDINGS: Normal ventricular morphology.

No midline shift or mass effect.

Normal appearance of brain parenchyma.

No intracranial hemorrhage, mass lesion, or acute infarction.

Visualized paranasal sinuses and mastoid air cells clear.

Bones unremarkable.
IMPRESSION: Normal exam.
